# Patient Record
Sex: Female | Born: 1990
Health system: Southern US, Community
[De-identification: ages and names within clinical notes are randomized; demographics above are authoritative.]

## PROBLEM LIST (undated history)

## (undated) DIAGNOSIS — E079 Disorder of thyroid, unspecified: Secondary | ICD-10-CM

## (undated) DIAGNOSIS — D649 Anemia, unspecified: Secondary | ICD-10-CM

## (undated) DIAGNOSIS — K219 Gastro-esophageal reflux disease without esophagitis: Secondary | ICD-10-CM

## (undated) DIAGNOSIS — O09299 Supervision of pregnancy with other poor reproductive or obstetric history, unspecified trimester: Secondary | ICD-10-CM

## (undated) DIAGNOSIS — F419 Anxiety disorder, unspecified: Secondary | ICD-10-CM

## (undated) DIAGNOSIS — E039 Hypothyroidism, unspecified: Secondary | ICD-10-CM

## (undated) DIAGNOSIS — M519 Unspecified thoracic, thoracolumbar and lumbosacral intervertebral disc disorder: Secondary | ICD-10-CM

## (undated) HISTORY — DX: Unspecified thoracic, thoracolumbar and lumbosacral intervertebral disc disorder: M51.9

## (undated) HISTORY — DX: Supervision of pregnancy with other poor reproductive or obstetric history, unspecified trimester: O09.299

## (undated) HISTORY — DX: Gastro-esophageal reflux disease without esophagitis: K21.9

## (undated) HISTORY — PX: NO PAST SURGERIES: SHX2092

## (undated) HISTORY — DX: Disorder of thyroid, unspecified: E07.9

## (undated) HISTORY — DX: Anemia, unspecified: D64.9

## (undated) HISTORY — PX: ESOPHAGEAL DILATION: SHX303

---

## 2012-06-29 DIAGNOSIS — K589 Irritable bowel syndrome without diarrhea: Secondary | ICD-10-CM | POA: Insufficient documentation

## 2012-06-29 DIAGNOSIS — Z9889 Other specified postprocedural states: Secondary | ICD-10-CM | POA: Insufficient documentation

## 2013-10-24 DIAGNOSIS — N390 Urinary tract infection, site not specified: Secondary | ICD-10-CM | POA: Insufficient documentation

## 2014-01-11 DIAGNOSIS — H20812 Fuchs' heterochromic cyclitis, left eye: Secondary | ICD-10-CM | POA: Insufficient documentation

## 2014-04-24 DIAGNOSIS — G43109 Migraine with aura, not intractable, without status migrainosus: Secondary | ICD-10-CM | POA: Insufficient documentation

## 2015-07-19 ENCOUNTER — Encounter: Payer: Self-pay | Admitting: Family Medicine

## 2015-07-19 ENCOUNTER — Ambulatory Visit (INDEPENDENT_AMBULATORY_CARE_PROVIDER_SITE_OTHER): Payer: 59 | Admitting: Family Medicine

## 2015-07-19 VITALS — BP 103/59 | HR 67 | Temp 98.3°F | Ht 67.0 in | Wt 153.0 lb

## 2015-07-19 DIAGNOSIS — O9089 Other complications of the puerperium, not elsewhere classified: Secondary | ICD-10-CM | POA: Diagnosis not present

## 2015-07-19 DIAGNOSIS — E039 Hypothyroidism, unspecified: Secondary | ICD-10-CM | POA: Diagnosis not present

## 2015-07-19 DIAGNOSIS — M654 Radial styloid tenosynovitis [de Quervain]: Secondary | ICD-10-CM

## 2015-07-19 DIAGNOSIS — R102 Pelvic and perineal pain: Secondary | ICD-10-CM | POA: Diagnosis not present

## 2015-07-19 LAB — TSH: TSH: 1.85 u[IU]/mL (ref 0.350–4.500)

## 2015-07-19 MED ORDER — LEVOTHYROXINE SODIUM 25 MCG PO TABS: 25.0000 ug | ORAL_TABLET | Freq: Every day | ORAL | Status: DC

## 2015-07-19 NOTE — Patient Instructions (Signed)
De Quervain's Tenosynovitis De Quervain's tenosynovitis involves inflammation of one or two tendon linings (sheaths) or strain of one or two tendons to the thumb: extensor pollicis brevis (EPB), or abductor pollicis longus (APL). This causes pain on the side of the wrist and base of the thumb. Tendon sheaths secrete a fluid that lubricates the tendon, allowing the tendon to move smoothly. When the sheath becomes inflamed, the tendon cannot move freely in the sheath. Both the EPB and APL tendons are important for proper use of the hand. The EPB tendon is important for straightening the thumb. The APL tendon is important for moving the thumb away from the index finger (abducting). The two tendons pass through a small tube (canal) in the wrist, near the base of the thumb. When the tendons become inflamed, pain is usually felt in this area. SYMPTOMS   Pain, tenderness, swelling, warmth, or redness over the base of the thumb and thumb side of the wrist.  Pain that gets worse when straightening the thumb.  Pain that gets worse when moving the thumb away from the index finger, against resistance.  Pain with pinching or gripping.  Locking or catching of the thumb.  Limited motion of the thumb.  Crackling sound (crepitation) when the tendon or thumb is moved or touched.  Fluid-filled cyst in the area of the base of the thumb. CAUSES   Tenosynovitis is often linked with overuse of the wrist.  Tenosynovitis may be caused by repeated injury to the thumb muscle and tendon units, and with repeated motions of the hand and wrist, due to friction of the tendon within the lining (sheath).  Tenosynovitis may also be due to a sudden increase in activity or change in activity. RISK INCREASES WITH:  Sports that involve repeated hand and wrist motions (golf, bowling, tennis, squash, racquetball).  Heavy labor.  Poor physical wrist strength and flexibility.  Failure to warm up properly before practice or  play.  Female gender.  New mothers who hold their baby's head for long periods or lift infants with thumbs in the infant's armpit (axilla). PREVENTION  Warm up and stretch properly before practice or competition.  Allow enough time for rest and recovery between practices and competition.  Maintain appropriate conditioning:  Cardiovascular fitness.  Forearm, wrist, and hand flexibility.  Muscle strength and endurance.  Use proper exercise technique. PROGNOSIS  This condition is usually curable within 6 weeks, if treated properly with non-surgical treatment and resting of the affected area.  RELATED COMPLICATIONS   Longer healing time if not properly treated or if not given enough time to heal.  Chronic inflammation, causing recurring symptoms of tenosynovitis. Permanent pain or restriction of movement.  Risks of surgery: infection, bleeding, injury to nerves (numbness of the thumb), continued pain, incomplete release of the tendon sheath, recurring symptoms, cutting of the tendons, tendons sliding out of position, weakness of the thumb, thumb stiffness. TREATMENT  First, treatment involves the use of medicine and ice, to reduce pain and inflammation. Patients are encouraged to stop or modify activities that aggravate the injury. Stretching and strengthening exercises may be advised. Exercises may be completed at home or with a therapist. You may be fitted with a brace or splint, to limit motion and allow the injury to heal. Your caregiver may also choose to give you a corticosteroid injection, to reduce the pain and inflammation. If non-surgical treatment is not successful, surgery may be needed. Most tenosynovitis surgeries are done as outpatient procedures (you go home the   same day). Surgery may involve local, regional (whole arm), or general anesthesia.  MEDICATION   If pain medicine is needed, nonsteroidal anti-inflammatory medicines (aspirin and ibuprofen), or other minor pain  relievers (acetaminophen), are often advised.  Do not take pain medicine for 7 days before surgery.  Prescription pain relievers are often prescribed only after surgery. Use only as directed and only as much as you need.  Corticosteroid injections may be given if your caregiver thinks they are needed. There is a limited number of times these injections may be given. COLD THERAPY   Cold treatment (icing) should be applied for 10 to 15 minutes every 2 to 3 hours for inflammation and pain, and immediately after activity that aggravates your symptoms. Use ice packs or an ice massage. SEEK MEDICAL CARE IF:   Symptoms get worse or do not improve in 2 to 4 weeks, despite treatment.  You experience pain, numbness, or coldness in the hand.  Blue, gray, or dark color appears in the fingernails.  Any of the following occur after surgery: increased pain, swelling, redness, drainage of fluids, bleeding in the affected area, or signs of infection.  New, unexplained symptoms develop. (Drugs used in treatment may produce side effects.) Document Released: 10/27/2005 Document Revised: 01/19/2012 Document Reviewed: 02/08/2009 [employer] Patient Information 2015 [employer]. This information is not intended to replace advice given to you by your health care provider. Make sure you discuss any questions you have with your health care provider.   

## 2015-07-26 ENCOUNTER — Encounter: Payer: Self-pay | Admitting: Family Medicine

## 2015-07-26 ENCOUNTER — Ambulatory Visit (INDEPENDENT_AMBULATORY_CARE_PROVIDER_SITE_OTHER): Payer: 59 | Admitting: Family Medicine

## 2015-07-26 VITALS — BP 118/64 | HR 68 | Temp 97.8°F | Resp 16 | Ht 67.0 in | Wt 156.0 lb

## 2015-07-26 DIAGNOSIS — R300 Dysuria: Secondary | ICD-10-CM

## 2015-07-26 DIAGNOSIS — N390 Urinary tract infection, site not specified: Secondary | ICD-10-CM | POA: Diagnosis not present

## 2015-07-26 LAB — POCT URINALYSIS DIPSTICK
BILIRUBIN UA: NEGATIVE
Glucose, UA: NEGATIVE
KETONES UA: NEGATIVE
Nitrite, UA: POSITIVE
Protein, UA: NEGATIVE
Urobilinogen, UA: 0.2
pH, UA: 5

## 2015-07-26 LAB — POCT UA - MICROSCOPIC ONLY
CASTS, UR, LPF, POC: NEGATIVE
CRYSTALS, UR, HPF, POC: NEGATIVE
EPITHELIAL CELLS, URINE PER MICROSCOPY: NEGATIVE
MUCUS UA: NEGATIVE
YEAST UA: NEGATIVE

## 2015-07-26 MED ORDER — NITROFURANTOIN MACROCRYSTAL 25 MG PO CAPS: 25.0000 mg | ORAL_CAPSULE | Freq: Every day | ORAL | Status: DC

## 2015-07-26 MED ORDER — SULFAMETHOXAZOLE-TRIMETHOPRIM 800-160 MG PO TABS: 1.0000 | ORAL_TABLET | Freq: Two times a day (BID) | ORAL | Status: DC

## 2015-07-26 NOTE — Patient Instructions (Signed)
Results for orders placed or performed in visit on 07/19/15  TSH  Result Value Ref Range   TSH 1.850 0.350 - 4.500 uIU/mL       Urinary Tract Infection Urinary tract infections (UTIs) can develop anywhere along your urinary tract. Your urinary tract is your body's drainage system for removing wastes and extra water. Your urinary tract includes two kidneys, two ureters, a bladder, and a urethra. Your kidneys are a pair of bean-shaped organs. Each kidney is about the size of your fist. They are located below your ribs, one on each side of your spine. CAUSES Infections are caused by microbes, which are microscopic organisms, including fungi, viruses, and bacteria. These organisms are so small that they can only be seen through a microscope. Bacteria are the microbes that most commonly cause UTIs. SYMPTOMS  Symptoms of UTIs may vary by age and gender of the patient and by the location of the infection. Symptoms in young women typically include a frequent and intense urge to urinate and a painful, burning feeling in the bladder or urethra during urination. Older women and men are more likely to be tired, shaky, and weak and have muscle aches and abdominal pain. A fever may mean the infection is in your kidneys. Other symptoms of a kidney infection include pain in your back or sides below the ribs, nausea, and vomiting. DIAGNOSIS To diagnose a UTI, your caregiver will ask you about your symptoms. Your caregiver also will ask to provide a urine sample. The urine sample will be tested for bacteria and white blood cells. White blood cells are made by your body to help fight infection. TREATMENT  Typically, UTIs can be treated with medication. Because most UTIs are caused by a bacterial infection, they usually can be treated with the use of antibiotics. The choice of antibiotic and length of treatment depend on your symptoms and the type of bacteria causing your infection. HOME CARE INSTRUCTIONS  If you  were prescribed antibiotics, take them exactly as your caregiver instructs you. Finish the medication even if you feel better after you have only taken some of the medication.  Drink enough water and fluids to keep your urine clear or pale yellow.  Avoid caffeine, tea, and carbonated beverages. They tend to irritate your bladder.  Empty your bladder often. Avoid holding urine for long periods of time.  Empty your bladder before and after sexual intercourse.  After a bowel movement, women should cleanse from front to back. Use each tissue only once. SEEK MEDICAL CARE IF:   You have back pain.  You develop a fever.  Your symptoms do not begin to resolve within 3 days. SEEK IMMEDIATE MEDICAL CARE IF:   You have severe back pain or lower abdominal pain.  You develop chills.  You have nausea or vomiting.  You have continued burning or discomfort with urination. MAKE SURE YOU:   Understand these instructions.  Will watch your condition.  Will get help right away if you are not doing well or get worse. Document Released: 08/06/2005 Document Revised: 04/27/2012 Document Reviewed: 12/05/2011 [facility] Patient Information 2015 [place], [place]. This information is not intended to replace advice given to you by your health care provider. Make sure you discuss any questions you have with your health care provider.

## 2015-07-26 NOTE — Progress Notes (Signed)
This chart was scribed for Olivia Haber, MD by Olivia Shepard, medical scribe at [facility].The patient was seen in exam room 1 and the patient's care was started at 8:30 AM.  Patient ID: Olivia Shepard MRN: [id number], DOB: Sep 20, 1991, 24 y.o. Date of Encounter: 07/26/2015  Primary Physician: Beverlyn Roux, MD  Chief Complaint:  Chief Complaint  Patient presents with  . burning urination    x this morning  . Abdominal Pain    x this morning  . Urinary Frequency    x this morning    HPI:  Olivia Shepard is a 24 y.o. female who presents to [facility] complaining of UTI symptoms starting this morning.  She's had this before, and her last UTI was about a month ago. She usually gets them about once a month. She was on a preventative program, and stopped when she became pregnant. When she was pregnant, she didn't have any UTI. She took nitrofuran to relief. She's using a diaphragm and asked if this has effects towards UTI.   She has a 34mo child who's breast fed at home.  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 She used to work at [employer].   Past Medical History  Diagnosis Date  . Anemia   . GERD (gastroesophageal reflux disease)   . Thyroid disease      Home Meds: Prior to Admission medications   Medication Sig Start Date End Date Taking? Authorizing Provider  levothyroxine (LEVOTHROID) 25 MCG tablet Take 1 tablet (25 mcg total) by mouth daily before breakfast. 07/19/15   Frazier Richards, MD    Allergies: No Known Allergies  Social History   Social History  . Marital Status: Married    Spouse Name: N/A  . Number of Children: N/A  . Years of Education: N/A   Occupational History  . Not on file.   Social History Main Topics  . Smoking status: Never Smoker   . Smokeless tobacco: Not on file  . Alcohol Use: Not on file  . Drug Use: Not on file  . Sexual Activity: Not on file   Other Topics Concern  . Not on file   Social  History Narrative     Review of Systems: Constitutional: negative for chills, fever, night sweats, weight changes, or fatigue  HEENT: negative for vision changes, hearing loss, congestion, rhinorrhea, ST, epistaxis, or sinus pressure Cardiovascular: negative for chest pain or palpitations Respiratory: negative for hemoptysis, wheezing, shortness of breath, or cough Abdominal: negative for nausea, vomiting, diarrhea, or constipation; positive for abdominal pain Dermatological: negative for rash Neurologic: negative for headache, dizziness, or syncope GU: positive for dysuria, increased urinary frequency All other systems reviewed and are otherwise negative with the exception to those above and in the HPI.  Physical Exam: Shepard pressure 118/64, pulse 68, temperature 97.8 F (36.6 C), temperature source Oral, resp. rate 16, height 5\' 7"  (1.702 m), weight 156 lb (70.761 kg), last menstrual period 06/25/2015, SpO2 99 %., Body mass index is 24.43 kg/(m^2). General: Well developed, well nourished, in no acute distress. Head: Normocephalic, atraumatic, eyes without discharge, sclera non-icteric, nares are without discharge. Bilateral auditory canals clear, TM's are without perforation, pearly grey and translucent with reflective cone of light bilaterally. Oral cavity moist, posterior pharynx without exudate, erythema, peritonsillar abscess, or post nasal drip.  Neck: Supple. No thyromegaly. Full ROM. No lymphadenopathy. Lungs: Clear bilaterally to auscultation without wheezes, rales, or rhonchi. Breathing  is unlabored. Heart: RRR with S1 S2. No murmurs, rubs, or gallops appreciated. Abdomen: Soft, non-tender, non-distended with normoactive bowel sounds. No hepatomegaly. No rebound/guarding. No obvious abdominal masses. Msk:  Strength and tone normal for age. Extremities/Skin: Warm and dry. No clubbing or cyanosis. No edema. No rashes or suspicious lesions. Neuro: Alert and oriented X 3. Moves all  extremities spontaneously. Gait is normal. CNII-XII grossly in tact. Psych:  Responds to questions appropriately with a normal affect.   Labs: Results for orders placed or performed in visit on 07/26/15  POCT urinalysis dipstick  Result Value Ref Range   Color, UA Amber    Clarity, UA Cloudy    Glucose, UA Negative    Bilirubin, UA Negative    Ketones, UA Negative    Spec Grav, UA <=1.005    Shepard, UA Small    pH, UA 5.0    Protein, UA Negative    Urobilinogen, UA 0.2    Nitrite, UA Positive    Leukocytes, UA small (1+) (A) Negative  POCT UA - Microscopic Only  Result Value Ref Range   WBC, Ur, HPF, POC 1-3    RBC, urine, microscopic 0-3    Bacteria, U Microscopic Trace    Mucus, UA Negative    Epithelial cells, urine per micros Negative    Crystals, Ur, HPF, POC negative    Casts, Ur, LPF, POC Negative    Yeast, UA Negative      ASSESSMENT AND PLAN:  24 y.o. year old female with recurrent UTI, probably related to the diaphragm use since no UTI during pregnancy. This chart was scribed in my presence and reviewed by me personally.    ICD-9-CM ICD-10-CM   1. Burning with urination 788.1 R30.0 POCT urinalysis dipstick     POCT UA - Microscopic Only  2. Recurrent UTI 599.0 N39.0 sulfamethoxazole-trimethoprim (BACTRIM DS,SEPTRA DS) 800-160 MG per tablet     nitrofurantoin (MACRODANTIN) 25 MG capsule   Signed, Olivia Haber, MD 07/26/2015 8:30 AM

## 2015-07-27 ENCOUNTER — Encounter: Payer: Self-pay | Admitting: Family Medicine

## 2015-07-27 NOTE — Assessment & Plan Note (Signed)
Pain since pregnancy, positive finkelsteins, tender over extensor policis tendons - rest, nsaids, thumb spica

## 2015-07-27 NOTE — Assessment & Plan Note (Signed)
Pain especially with intercourse since delivery, did have 2 perineal lacerations per her report - recommend lubrication with any sexual activity - f/u if still causing problems in 2-3 months

## 2015-07-27 NOTE — Progress Notes (Signed)
   Subjective:   Camryn Lampson is a 24 y.o. female with a history of gestational hypothyroid here for establish care  Pt reports only history is low thyroid in pregnancy. 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 She is currently breastfeeding. Uses diaphragm for birth control, severe migraines from any hormonal contraceptives in past. Satisfied with current method. Last pap during pregnancy.  Review of Systems:  Per HPI. All other systems reviewed and are negative.   PMH, PSH, Medications, Allergies, and FmHx reviewed and updated in EMR.  Social History: never smoker  Objective:  BP 103/59 mmHg  Pulse 67  Temp(Src) 98.3 F (36.8 C) (Oral)  Ht 5\' 7"  (1.702 m)  Wt 153 lb (69.4 kg)  BMI 23.96 kg/m2  LMP 07/05/2015  Gen:  24 y.o. female in NAD HEENT: NCAT, MMM, EOMI, PERRL, anicteric sclerae CV: RRR, no MRG, no JVD Resp: Non-labored, CTAB, no wheezes noted Abd: Soft, NTND, BS present, no guarding or organomegaly Ext: WWP, no edema MSK: Full ROM, strength intact Neuro: Alert and oriented, speech normal      Chemistry   No results found for: NA, K, CL, CO2, BUN, CREATININE, GLU No results found for: CALCIUM, ALKPHOS, AST, ALT, BILITOT    No results found for: WBC, HGB, HCT, MCV, PLT Lab Results  Component Value Date   TSH 1.850 07/19/2015   No results found for: HGBA1C Assessment & Plan:     Timberlyn Pickford is a 24 y.o. female here for establish care  De Quervain's tenosynovitis, left Pain since pregnancy, positive finkelsteins, tender over extensor policis tendons - rest, nsaids, thumb spica  Hypothyroidism Started in pregnancy, controlled with low dose synthroid (53mcg), normal 8 weeks postpartum (now 4 month pp) - recheck tsh today - trial off synthroid, recheck tsh in 6 weeks  Postpartum perineal pain Pain especially with intercourse since delivery, did have 2 perineal lacerations per her report - recommend  lubrication with any sexual activity - f/u if still causing problems in 2-3 months    Beverlyn Roux, MD, MPH [facility] PGY-3 07/27/2015 9:01 PM

## 2015-07-27 NOTE — Assessment & Plan Note (Signed)
Started in pregnancy, controlled with low dose synthroid (9mcg), normal 8 weeks postpartum (now 4 month pp) - recheck tsh today - trial off synthroid, recheck tsh in 6 weeks

## 2015-08-30 ENCOUNTER — Encounter: Payer: Self-pay | Admitting: Family Medicine

## 2015-08-31 ENCOUNTER — Ambulatory Visit (INDEPENDENT_AMBULATORY_CARE_PROVIDER_SITE_OTHER): Payer: 59 | Admitting: Family Medicine

## 2015-08-31 ENCOUNTER — Encounter: Payer: Self-pay | Admitting: Family Medicine

## 2015-08-31 VITALS — BP 97/62 | HR 77 | Temp 98.5°F | Ht 67.0 in | Wt 148.8 lb

## 2015-08-31 DIAGNOSIS — E039 Hypothyroidism, unspecified: Secondary | ICD-10-CM | POA: Diagnosis not present

## 2015-08-31 LAB — T4, FREE: FREE T4: 0.98 ng/dL (ref 0.80–1.80)

## 2015-08-31 LAB — TSH: TSH: 2.92 u[IU]/mL (ref 0.350–4.500)

## 2015-08-31 LAB — T3, FREE: T3, Free: 3 pg/mL (ref 2.3–4.2)

## 2015-08-31 NOTE — Progress Notes (Signed)
Patient ID: Olivia Shepard, female   DOB: 08/07/1991, 24 y.o.   MRN: [id number]   Subjective: CC:  Fatigue, decline in milk output, heat intolerance HPI: This history was provided by the patient.  Patient's PMH is significant for thyroid disease, anemia and GERD.  Patient is a 24 y.o. female presenting to clinic today for several issues she believes to be related to hypothyroidism.  Patient was diagnosed with hypothyroidism during pregnancy earlier this year.  She stopped taking the Levothyroxine in September 2016.  Over the last two weeks, patient states she has developed heat intolerance with a sensation that she cannot take a deep breath in temperatures as low as 83 or 85 fahrenheit.  Patient denies cold intolerance.  Patient also states her breast milk production has declined from 5-6 ounces each pumping session to 1-2 ounces.  Patient also complains of fatigue.  She is a night shift nurse and states it used to take her a day to "recover" from working night shift, but now it takes 2-3 days.  Patient states she finds herself napping much more often than usual during the day over the past 2 weeks.  Concerns today include:  1. Fatigue  Social History Reviewed: non-smoker. FamHx and MedHx updated.  Please see EMR.  ROS: General: Complains of fatigue, denies fever/chills, unintentional weight loss/gain, and difficulty sleeping. Cardio: Denies palpitations, chest pain, chest tightness, dizziness, diaphoresis. Pulm: Denies overt shortness of breath, wheezing and cough. GI:  Reports occasional constipation that has been presents since the birth of her daughter 6 months ago.  Denies N/V/D, abdominal pain.   Skin: Denies brittle skin/nails, bruising, rashes, itching, dry skin. Neuro: Denies dizziness, changes in vision, changes in gait, weakness, numbness.  Objective: Office vital signs reviewed. BP 97/62 mmHg  Pulse 77  Temp(Src) 98.5 F (36.9 C) (Oral)  Ht 5\' 7"  (1.702 m)  Wt 148 lb 12.8 oz  (67.495 kg)  BMI 23.30 kg/m2  LMP 08/28/2015 (Exact Date)  Physical Examination:  General: Awake, alert, well-nourished, NAD Cardio: RRR, S1S2 heard, no murmurs appreciated, No edema, cyanosis or clubbing; +2 radial pulses bilaterally Pulm: CTAB, no wheezes, rhonchi or rales GI: soft, not distended,+BS x4, no hepatomegaly, no splenomegaly Skin: dry, intact, no rashes or lesions Neuro: Strength and sensation grossly intact, CNII-XII grossly intact.   Assessment/ Plan: 24 y.o. female presenting with complaints of fatigue, heat intolerance and difficulty producing breast milk after discontinuing Levothyroxine approximately one month ago.  Patient has a history of hypothyroid disease in pregnancy.  Patient's symptoms or fatigue, heat intolerance and decline in breast milk production are consistent with hormonal imbalance. Patient's physical exam unremarkable.  Further diagnostic lab testing will be needed to determine origin of patient's symptoms.   1. Hypothyroidism, unspecified hypothyroidism type - TSH - T3, Free - T4, Free - Cortisol     Sherron Ales, NP [place]

## 2015-08-31 NOTE — Patient Instructions (Signed)

## 2015-08-31 NOTE — Assessment & Plan Note (Addendum)
Stopped synthroid 22mcg 1 month ago, symptoms x2 weeks of heat and cold intolerance, fatigue, decreased milk production - recheck TSH, T3, T4 and screening serum cortisol (unlikely Sheehans but possible)

## 2015-09-01 LAB — CORTISOL: Cortisol, Plasma: 15.7 ug/dL

## 2015-09-03 ENCOUNTER — Encounter: Payer: Self-pay | Admitting: Family Medicine

## 2015-09-06 ENCOUNTER — Encounter: Payer: Self-pay | Admitting: Family Medicine

## 2015-09-25 ENCOUNTER — Encounter: Payer: Self-pay | Admitting: Family Medicine

## 2015-09-25 ENCOUNTER — Ambulatory Visit (INDEPENDENT_AMBULATORY_CARE_PROVIDER_SITE_OTHER): Payer: 59 | Admitting: Family Medicine

## 2015-09-25 VITALS — BP 111/59 | HR 71 | Temp 98.2°F | Ht 67.0 in | Wt 148.0 lb

## 2015-09-25 DIAGNOSIS — M654 Radial styloid tenosynovitis [de Quervain]: Secondary | ICD-10-CM | POA: Diagnosis not present

## 2015-09-25 DIAGNOSIS — E039 Hypothyroidism, unspecified: Secondary | ICD-10-CM

## 2015-09-25 NOTE — Assessment & Plan Note (Signed)
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

## 2015-09-25 NOTE — Assessment & Plan Note (Signed)
Restarted synthroid based on symptoms despite normal labs, doing much better. Breastmilk nadir was 1 ounce per pumping and is now back up to adequate for her 17mo. - continue synthroid 63mcg daily - will discuss attempting to stop again once finished breastfeeding

## 2015-09-25 NOTE — Progress Notes (Signed)
.     Subjective:   Olivia Shepard is a 24 y.o. female with a history of hypothyroidism, recent pregnancy and dequervains here for f/u fatigue and low breastmilk production  Labs normal off synthroid last time but severe symptoms limiting ability to breastfeed. Joint decision to restart synthroid 2 weeks ago. Patient reports that within two days of restarting her breastmilk increased and she started having more energy and by 1 week she felt back to her usual self.   She also reports that her wrist pain is unchanged. 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 She takes ibuprofen occasionally which helps, as does ice. She does not want to take meds much because of breastfeeding  Review of Systems:  Per HPI. All other systems reviewed and are negative.   PMH, PSH, Medications, Allergies, and FmHx reviewed and updated in EMR.  Social History: never smoker  Objective:  BP 111/59 mmHg  Pulse 71  Temp(Src) 98.2 F (36.8 C) (Oral)  Ht 5\' 7"  (1.702 m)  Wt 148 lb (67.132 kg)  BMI 23.17 kg/m2  LMP 09/25/2015  Gen:  24 y.o. female in NAD HEENT: NCAT, MMM, EOMI, PERRL, anicteric sclerae CV: RRR, no MRG, no JVD Resp: Non-labored, CTAB, no wheezes noted Abd: Soft, NTND, BS present, no guarding or organomegaly Ext: WWP, no edema MSK: Full ROM, strength intact Neuro: Alert and oriented, speech normal      Chemistry   No results found for: NA, K, CL, CO2, BUN, CREATININE, GLU No results found for: CALCIUM, ALKPHOS, AST, ALT, BILITOT    No results found for: WBC, HGB, HCT, MCV, PLT Lab Results  Component Value Date   TSH 2.920 08/31/2015   No results found for: HGBA1C Assessment & Plan:     Olivia Shepard is a 24 y.o. female here for f/u hypothyroid  Hypothyroidism Restarted synthroid based on symptoms despite normal labs, doing much better. Breastmilk nadir was 1 ounce per pumping and is now back  up to adequate for her 15mo. 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

## 2015-10-09 ENCOUNTER — Encounter: Payer: Self-pay | Admitting: Family Medicine

## 2015-10-09 ENCOUNTER — Ambulatory Visit (INDEPENDENT_AMBULATORY_CARE_PROVIDER_SITE_OTHER): Payer: 59 | Admitting: Family Medicine

## 2015-10-09 VITALS — BP 100/68 | Ht 67.0 in | Wt 148.0 lb

## 2015-10-09 DIAGNOSIS — M654 Radial styloid tenosynovitis [de Quervain]: Secondary | ICD-10-CM

## 2015-10-09 MED ORDER — TRIAMCINOLONE ACETONIDE 10 MG/ML IJ SUSP
10.0000 mg | Freq: Once | INTRAMUSCULAR | Status: AC
  Administered 2015-10-09: 10 mg via INTRAMUSCULAR

## 2015-10-09 MED ORDER — DICLOFENAC SODIUM 1 % TD GEL: 2.0000 g | Freq: Four times a day (QID) | TRANSDERMAL | Status: DC

## 2015-10-10 DIAGNOSIS — M654 Radial styloid tenosynovitis [de Quervain]: Secondary | ICD-10-CM | POA: Insufficient documentation

## 2015-10-10 NOTE — Assessment & Plan Note (Signed)
B/l, L>>R.  Does not desire to take NSAIDs due to breastfeeding. Unable to wear thumb spica splint due lifestyle and occupational needs.  Performed U/S guided injection which provided relief.  Offered voltaren for left rather than injection, but she declined, wanting an injection 1st.  She will try voltaren gel for the right and f/u PRN for a right sided injection.  She will f/u in 2 weeks. She will try to wear the splint as much as possible.  Call with any questions.

## 2015-10-10 NOTE — Progress Notes (Signed)
  Xoey Peglow - 24 y.o. female MRN [id number]  Date of birth: Dec 13, 1990  CC: Left wrist pain  SUBJECTIVE:   HPI  B/l wrist pain for the last few months. Left is much worse than the right.  - She works as an Occupational hygienist, currently 2 days a week.  - Additionally she has an ~14 month old and it causes her discomfort to pick her daughter up.  - Doesn't want to take NSAIDs due to breastfeeding.  - She was provided a brace, but has not been able to use it much because she needs to care for her baby and work.   - This has not been a problem in past.  - Began several months PP and has worsened over the last few weeks.  - NO trauma to either wrist.   ROS:     8 point RoS negative other than that listed above in regards to MSK issue.   HISTORY: Past Medical, Surgical, Social, and Family History Reviewed & Updated per EMR.  Pertinent Historical Findings include: hypothyroid, PP, not diabetic.   OBJECTIVE: BP 100/68 mmHg  Ht 5\' 7"  (1.702 m)  Wt 148 lb (67.132 kg)  BMI 23.17 kg/m2  LMP 09/25/2015  Physical Exam  Calm, NAD  non-labored breathing.   Full RoM of b/l upper extremities.   + Finkelstein's.  Tender over 1st extensor compartment, L>R, and extending anteriorly over the plamar aspect of the distal radial head.   Strength 5/5 b/l. No visible swelling.   Sensation is intact.   Ultrasound: Left wrist u/s.  Normal fibrillar pattern of the APL and EPB with surrounding anechoic fluid.  Radial artery identified and a 1st compartment injection was performed out of plane with a trans view of the tendons as below. Overall the u/s was consistent with Harriet Pho.   Consent obtained and verified. Sterile betadine prep. Furthur cleansed with alcohol. Topical analgesic spray: Ethyl chloride. Joint:1st dorsal compartment of the left wrist. Approached in typical fashion with: Out of plane with trans view of tendons using u/s.   Completed without difficulty Meds: 1 cc of 1% lidocaine and 1cc of  triamcinolone.  Needle:25 g x 1.5" Aftercare instructions and Red flags advised.  MEDICATIONS, LABS & OTHER ORDERS: Previous Medications   LEVOTHYROXINE (LEVOTHROID) 25 MCG TABLET    Take 1 tablet (25 mcg total) by mouth daily before breakfast.   NITROFURANTOIN (MACRODANTIN) 25 MG CAPSULE    Take 1 capsule (25 mg total) by mouth daily after breakfast.   Modified Medications   No medications on file   New Prescriptions   DICLOFENAC SODIUM (VOLTAREN) 1 % GEL    Apply 2 g topically 4 (four) times daily.   Discontinued Medications   No medications on file  No orders of the defined types were placed in this encounter.   ASSESSMENT & PLAN: See problem based charting & AVS for pt instructions.

## 2015-10-23 ENCOUNTER — Ambulatory Visit: Payer: 59 | Admitting: Family Medicine

## 2015-11-11 NOTE — L&D Delivery Note (Signed)
Delivery Note Pt was 9cm dilated with uncontrollable urge to push. Tried position change unto knees and leaning on bed to ease discomfort. Pt complained shortly after of need to push and proceeded to do so.  At 3:28 PM a viable female was delivered via Vaginal, Spontaneous Delivery (Presentation: OA ;  ).  APGAR: 9, 9; weight 8 lb 10.5 oz (3925 g).   Placenta status: intact, shultz, .    Anesthesia:  None; Episiotomy: None Lacerations: 2nd degree Suture Repair: 2.0 vicryl Est. Blood Loss (mL): 150  Mom to postpartum.  Baby to Couplet care / Skin to Skin.  Lorna Few Worema Ximenna Fonseca 09/16/2016, 5:57 PM

## 2015-12-05 MED FILL — LEVOTHYROXINE 25 MCG TABLET: 25 | 90 days supply | Qty: 90 | Fill #1 | Status: TO

## 2015-12-17 ENCOUNTER — Encounter: Payer: Self-pay | Admitting: Family Medicine

## 2015-12-27 ENCOUNTER — Ambulatory Visit (INDEPENDENT_AMBULATORY_CARE_PROVIDER_SITE_OTHER): Payer: 59 | Admitting: Family Medicine

## 2015-12-27 ENCOUNTER — Other Ambulatory Visit (HOSPITAL_COMMUNITY)
Admission: RE | Admit: 2015-12-27 | Discharge: 2015-12-27 | Disposition: A | Discharge status: Home / Self Care | Payer: 59 | Source: Ambulatory Visit | Attending: Family Medicine | Admitting: Family Medicine

## 2015-12-27 ENCOUNTER — Encounter: Payer: Self-pay | Admitting: Family Medicine

## 2015-12-27 VITALS — BP 113/51 | HR 81 | Temp 98.2°F | Ht 67.0 in | Wt 144.1 lb

## 2015-12-27 DIAGNOSIS — Z124 Encounter for screening for malignant neoplasm of cervix: Secondary | ICD-10-CM | POA: Diagnosis not present

## 2015-12-27 DIAGNOSIS — Z01419 Encounter for gynecological examination (general) (routine) without abnormal findings: Secondary | ICD-10-CM | POA: Diagnosis not present

## 2015-12-27 DIAGNOSIS — Z114 Encounter for screening for human immunodeficiency virus [HIV]: Secondary | ICD-10-CM | POA: Diagnosis not present

## 2015-12-27 DIAGNOSIS — Z0000 Encounter for general adult medical examination without abnormal findings: Secondary | ICD-10-CM

## 2015-12-27 LAB — BASIC METABOLIC PANEL WITH GFR
BUN: 12 mg/dL (ref 7–25)
CALCIUM: 9.3 mg/dL (ref 8.6–10.2)
CO2: 27 mmol/L (ref 20–31)
Chloride: 104 mmol/L (ref 98–110)
Creat: 0.55 mg/dL (ref 0.50–1.10)
GLUCOSE: 71 mg/dL (ref 65–99)
Potassium: 3.7 mmol/L (ref 3.5–5.3)
Sodium: 140 mmol/L (ref 135–146)

## 2015-12-27 LAB — LIPID PANEL
CHOL/HDL RATIO: 1.7 ratio (ref <=5.0)
CHOLESTEROL: 144 mg/dL (ref 125–200)
HDL: 83 mg/dL (ref >=46)
LDL CALC: 55 mg/dL (ref <130)
Triglycerides: 32 mg/dL (ref <150)
VLDL: 6 mg/dL (ref <30)

## 2015-12-27 NOTE — Progress Notes (Signed)
   Subjective:   Akirah Moros is a 25 y.o. female with a history of hypothyroidism here for annual wellness exam  Pt denies any health concerns or complaints. She exercise 1-2 times per week and eats a well balanced diet. She does not smoke or drink alcohol.  Review of Systems:  Per HPI. All other systems reviewed and are negative.   PMH, PSH, Medications, Allergies, and FmHx reviewed and updated in EMR.  Social History: never smoker  Objective:  BP 113/51 mmHg  Pulse 81  Temp(Src) 98.2 F (36.8 C) (Oral)  Ht 5\' 7"  (1.702 m)  Wt 144 lb 1.6 oz (65.363 kg)  BMI 22.56 kg/m2  Gen:  25 y.o. female in NAD HEENT: NCAT, MMM, EOMI, PERRL, anicteric sclerae CV: RRR, no MRG, no JVD Resp: Non-labored, CTAB, no wheezes noted Abd: Soft, NTND, BS present, no guarding or organomegaly Ext: WWP, no edema MSK: Full ROM, strength intact Neuro: Alert and oriented, speech normal Pelvic: Normal vaginal canal, cervix, normal adnexa and uterus on bimanual, no CMT      Chemistry   No results found for: NA, K, CL, CO2, BUN, CREATININE, GLU No results found for: CALCIUM, ALKPHOS, AST, ALT, BILITOT    No results found for: WBC, HGB, HCT, MCV, PLT Lab Results  Component Value Date   TSH 2.920 08/31/2015   No results found for: HGBA1C Assessment & Plan:     Jatavia Guglielmetti is a 25 y.o. female here for well woman exam  No concerns, healthy woman with active lifestyle. Will check HIV, pap smear. Pt also request screening bloodwork which is reasonable so will check BMP, lipids. H/op anemia but reports normal hemoglobin at [facility] a few days ago so will not recheck today. Also with h/o hypothyroidism which became symptomatic despite normal TSH. Is on low dose synthroid without symptoms and will plan to attempt to come off when no longer breastfeeding.  Beverlyn Roux, MD, MPH [facility] PGY-3 12/27/2015 4:24 PM

## 2015-12-27 NOTE — Patient Instructions (Signed)
Health Maintenance, Female Adopting a healthy lifestyle and getting preventive care can go a long way to promote health and wellness. Talk with your health care provider about what schedule of regular examinations is right for you. This is a good chance for you to check in with your provider about disease prevention and staying healthy. In between checkups, there are plenty of things you can do on your own. Experts have done a lot of research about which lifestyle changes and preventive measures are most likely to keep you healthy. Ask your health care provider for more information. WEIGHT AND DIET  Eat a healthy diet  Be sure to include plenty of vegetables, fruits, low-fat dairy products, and lean protein.  Do not eat a lot of foods high in solid fats, added sugars, or salt.  Get regular exercise. This is one of the most important things you can do for your health.  Most adults should exercise for at least 150 minutes each week. The exercise should increase your heart rate and make you sweat (moderate-intensity exercise).  Most adults should also do strengthening exercises at least twice a week. This is in addition to the moderate-intensity exercise.  Maintain a healthy weight  Body mass index (BMI) is a measurement that can be used to identify possible weight problems. It estimates body fat based on height and weight. Your health care provider can help determine your BMI and help you achieve or maintain a healthy weight.  For females 28 years of age and older:   A BMI below 18.5 is considered underweight.  A BMI of 18.5 to 24.9 is normal.  A BMI of 25 to 29.9 is considered overweight.  A BMI of 30 and above is considered obese.  Watch levels of cholesterol and blood lipids  You should start having your blood tested for lipids and cholesterol at 27 years of age, then have this test every 5 years.  You may need to have your cholesterol levels checked more often if:  Your lipid  or cholesterol levels are high.  You are older than 27 years of age.  You are at high risk for heart disease.  CANCER SCREENING   Lung Cancer  Lung cancer screening is recommended for adults 75-66 years old who are at high risk for lung cancer because of a history of smoking.  A yearly low-dose CT scan of the lungs is recommended for people who:  Currently smoke.  Have quit within the past 15 years.  Have at least a 30-pack-year history of smoking. A pack year is smoking an average of one pack of cigarettes a day for 1 year.  Yearly screening should continue until it has been 15 years since you quit.  Yearly screening should stop if you develop a health problem that would prevent you from having lung cancer treatment.  Breast Cancer  Practice breast self-awareness. This means understanding how your breasts normally appear and feel.  It also means doing regular breast self-exams. Let your health care provider know about any changes, no matter how small.  If you are in your 20s or 30s, you should have a clinical breast exam (CBE) by a health care provider every 1-3 years as part of a regular health exam.  If you are 25 or older, have a CBE every year. Also consider having a breast X-ray (mammogram) every year.  If you have a family history of breast cancer, talk to your health care provider about genetic screening.  If you  are at high risk for breast cancer, talk to your health care provider about having an MRI and a mammogram every year.  Breast cancer gene (BRCA) assessment is recommended for women who have family members with BRCA-related cancers. BRCA-related cancers include:  Breast.  Ovarian.  Tubal.  Peritoneal cancers.  Results of the assessment will determine the need for genetic counseling and BRCA1 and BRCA2 testing. Cervical Cancer Your health care provider may recommend that you be screened regularly for cancer of the pelvic organs (ovaries, uterus, and  vagina). This screening involves a pelvic examination, including checking for microscopic changes to the surface of your cervix (Pap test). You may be encouraged to have this screening done every 3 years, beginning at age 21.  For women ages 30-65, health care providers may recommend pelvic exams and Pap testing every 3 years, or they may recommend the Pap and pelvic exam, combined with testing for human papilloma virus (HPV), every 5 years. Some types of HPV increase your risk of cervical cancer. Testing for HPV may also be done on women of any age with unclear Pap test results.  Other health care providers may not recommend any screening for nonpregnant women who are considered low risk for pelvic cancer and who do not have symptoms. Ask your health care provider if a screening pelvic exam is right for you.  If you have had past treatment for cervical cancer or a condition that could lead to cancer, you need Pap tests and screening for cancer for at least 20 years after your treatment. If Pap tests have been discontinued, your risk factors (such as having a new sexual partner) need to be reassessed to determine if screening should resume. Some women have medical problems that increase the chance of getting cervical cancer. In these cases, your health care provider may recommend more frequent screening and Pap tests. Colorectal Cancer  This type of cancer can be detected and often prevented.  Routine colorectal cancer screening usually begins at 27 years of age and continues through 27 years of age.  Your health care provider may recommend screening at an earlier age if you have risk factors for colon cancer.  Your health care provider may also recommend using home test kits to check for hidden blood in the stool.  A small camera at the end of a tube can be used to examine your colon directly (sigmoidoscopy or colonoscopy). This is done to check for the earliest forms of colorectal  cancer.  Routine screening usually begins at age 50.  Direct examination of the colon should be repeated every 5-10 years through 27 years of age. However, you may need to be screened more often if early forms of precancerous polyps or small growths are found. Skin Cancer  Check your skin from head to toe regularly.  Tell your health care provider about any new moles or changes in moles, especially if there is a change in a mole's shape or color.  Also tell your health care provider if you have a mole that is larger than the size of a pencil eraser.  Always use sunscreen. Apply sunscreen liberally and repeatedly throughout the day.  Protect yourself by wearing long sleeves, pants, a wide-brimmed hat, and sunglasses whenever you are outside. HEART DISEASE, DIABETES, AND HIGH BLOOD PRESSURE   High blood pressure causes heart disease and increases the risk of stroke. High blood pressure is more likely to develop in:  People who have blood pressure in the high end   of the normal range (130-139/85-89 mm Hg).  People who are overweight or obese.  People who are African American.  If you are 38-23 years of age, have your blood pressure checked every 3-5 years. If you are 61 years of age or older, have your blood pressure checked every year. You should have your blood pressure measured twice--once when you are at a hospital or clinic, and once when you are not at a hospital or clinic. Record the average of the two measurements. To check your blood pressure when you are not at a hospital or clinic, you can use:  An automated blood pressure machine at a pharmacy.  A home blood pressure monitor.  If you are between 45 years and 39 years old, ask your health care provider if you should take aspirin to prevent strokes.  Have regular diabetes screenings. This involves taking a blood sample to check your fasting blood sugar level.  If you are at a normal weight and have a low risk for diabetes,  have this test once every three years after 27 years of age.  If you are overweight and have a high risk for diabetes, consider being tested at a younger age or more often. PREVENTING INFECTION  Hepatitis B  If you have a higher risk for hepatitis B, you should be screened for this virus. You are considered at high risk for hepatitis B if:  You were born in a country where hepatitis B is common. Ask your health care provider which countries are considered high risk.  Your parents were born in a high-risk country, and you have not been immunized against hepatitis B (hepatitis B vaccine).  You have HIV or AIDS.  You use needles to inject street drugs.  You live with someone who has hepatitis B.  You have had sex with someone who has hepatitis B.  You get hemodialysis treatment.  You take certain medicines for conditions, including cancer, organ transplantation, and autoimmune conditions. Hepatitis C  Blood testing is recommended for:  Everyone born from [birth year] through 1967.  Anyone with known risk factors for hepatitis C. Sexually transmitted infections (STIs)  You should be screened for sexually transmitted infections (STIs) including gonorrhea and chlamydia if:  You are sexually active and are younger than 27 years of age.  You are older than 27 years of age and your health care provider tells you that you are at risk for this type of infection.  Your sexual activity has changed since you were last screened and you are at an increased risk for chlamydia or gonorrhea. Ask your health care provider if you are at risk.  If you do not have HIV, but are at risk, it may be recommended that you take a prescription medicine daily to prevent HIV infection. This is called pre-exposure prophylaxis (PrEP). You are considered at risk if:  You are sexually active and do not regularly use condoms or know the HIV status of your partner(s).  You take drugs by injection.  You are sexually  active with a partner who has HIV. Talk with your health care provider about whether you are at high risk of being infected with HIV. If you choose to begin PrEP, you should first be tested for HIV. You should then be tested every 3 months for as long as you are taking PrEP.  PREGNANCY   If you are premenopausal and you may become pregnant, ask your health care provider about preconception counseling.  If you may  become pregnant, take 400 to 800 micrograms (mcg) of folic acid every day.  If you want to prevent pregnancy, talk to your health care provider about birth control (contraception). OSTEOPOROSIS AND MENOPAUSE   Osteoporosis is a disease in which the bones lose minerals and strength with aging. This can result in serious bone fractures. Your risk for osteoporosis can be identified using a bone density scan.  If you are 61 years of age or older, or if you are at risk for osteoporosis and fractures, ask your health care provider if you should be screened.  Ask your health care provider whether you should take a calcium or vitamin D supplement to lower your risk for osteoporosis.  Menopause may have certain physical symptoms and risks.  Hormone replacement therapy may reduce some of these symptoms and risks. Talk to your health care provider about whether hormone replacement therapy is right for you.  HOME CARE INSTRUCTIONS   Schedule regular health, dental, and eye exams.  Stay current with your immunizations.   Do not use any tobacco products including cigarettes, chewing tobacco, or electronic cigarettes.  If you are pregnant, do not drink alcohol.  If you are breastfeeding, limit how much and how often you drink alcohol.  Limit alcohol intake to no more than 1 drink per day for nonpregnant women. One drink equals 12 ounces of beer, 5 ounces of wine, or 1 ounces of hard liquor.  Do not use street drugs.  Do not share needles.  Ask your health care provider for help if  you need support or information about quitting drugs.  Tell your health care provider if you often feel depressed.  Tell your health care provider if you have ever been abused or do not feel safe at home.   This information is not intended to replace advice given to you by your health care provider. Make sure you discuss any questions you have with your health care provider.   Document Released: 05/12/2011 Document Revised: 11/17/2014 Document Reviewed: 09/28/2013 Elsevier Interactive Patient Education [employer].

## 2015-12-28 LAB — HIV ANTIBODY (ROUTINE TESTING W REFLEX): HIV 1&2 Ab, 4th Generation: NONREACTIVE

## 2015-12-28 LAB — CYTOLOGY - PAP

## 2016-01-24 DIAGNOSIS — Z3201 Encounter for pregnancy test, result positive: Secondary | ICD-10-CM | POA: Diagnosis not present

## 2016-01-24 DIAGNOSIS — N911 Secondary amenorrhea: Secondary | ICD-10-CM | POA: Diagnosis not present

## 2016-02-14 DIAGNOSIS — Z3A09 9 weeks gestation of pregnancy: Secondary | ICD-10-CM | POA: Diagnosis not present

## 2016-02-14 DIAGNOSIS — Z36 Encounter for antenatal screening of mother: Secondary | ICD-10-CM | POA: Diagnosis not present

## 2016-02-14 DIAGNOSIS — O26891 Other specified pregnancy related conditions, first trimester: Secondary | ICD-10-CM | POA: Diagnosis not present

## 2016-02-14 DIAGNOSIS — O3111X1 Continuing pregnancy after spontaneous abortion of one fetus or more, first trimester, fetus 1: Secondary | ICD-10-CM | POA: Diagnosis not present

## 2016-02-14 DIAGNOSIS — Z3481 Encounter for supervision of other normal pregnancy, first trimester: Secondary | ICD-10-CM | POA: Diagnosis not present

## 2016-02-14 LAB — OB RESULTS CONSOLE ABO/RH: RH TYPE: POSITIVE

## 2016-02-27 MED FILL — LEVOTHYROXINE 25 MCG TABLET: 25 | 90 days supply | Qty: 90 | Fill #0

## 2016-03-13 DIAGNOSIS — Z3A12 12 weeks gestation of pregnancy: Secondary | ICD-10-CM | POA: Diagnosis not present

## 2016-03-13 DIAGNOSIS — Z3481 Encounter for supervision of other normal pregnancy, first trimester: Secondary | ICD-10-CM | POA: Diagnosis not present

## 2016-04-22 DIAGNOSIS — Z36 Encounter for antenatal screening of mother: Secondary | ICD-10-CM | POA: Diagnosis not present

## 2016-04-22 DIAGNOSIS — O99282 Endocrine, nutritional and metabolic diseases complicating pregnancy, second trimester: Secondary | ICD-10-CM | POA: Diagnosis not present

## 2016-04-22 DIAGNOSIS — O3111X0 Continuing pregnancy after spontaneous abortion of one fetus or more, first trimester, not applicable or unspecified: Secondary | ICD-10-CM | POA: Diagnosis not present

## 2016-04-22 DIAGNOSIS — Z3A19 19 weeks gestation of pregnancy: Secondary | ICD-10-CM | POA: Diagnosis not present

## 2016-06-09 MED FILL — LEVOTHYROXINE 25 MCG TABLET: 25 | 30 days supply | Qty: 30 | Fill #1

## 2016-06-23 LAB — OB RESULTS CONSOLE HIV ANTIBODY (ROUTINE TESTING): HIV: NONREACTIVE

## 2016-06-24 ENCOUNTER — Ambulatory Visit: Payer: Medicaid Other | Attending: Obstetrics and Gynecology | Admitting: Physical Therapy

## 2016-06-24 DIAGNOSIS — M545 Low back pain, unspecified: Secondary | ICD-10-CM

## 2016-06-24 NOTE — Therapy (Signed)
[facility] [address] [place], [place], [address] Phone: [phone]   Fax:  [phone]  Physical Therapy Evaluation/1 time visit  Patient Details  Name: Olivia Shepard MRN: [id number] Date of Birth: [birth year] Referring Provider: Dr. Rica Shepard  Encounter Date: 06/24/2016      PT End of Session - 06/24/16 1615    Visit Number 1   PT Start Time [id number]   PT Stop Time 1613   PT Time Calculation (min) 40 min   Activity Tolerance Patient tolerated treatment well      Past Medical History:  Diagnosis Date  . Anemia   . GERD (gastroesophageal reflux disease)   . Thyroid disease     No past surgical history on file.  There were no vitals filed for this visit.       Subjective Assessment - 06/24/16 1537    Subjective When pregnant with 1st child left hip pain started;  with current 2nd pregnacy ( 27 weeks) pain has returned to left hip;  worse with walking in left SI jt region;  stiff in AM and when get off the floor.  Has a maternity belt .   Limitations Walking;House hold activities;Sitting   Diagnostic tests none   Patient Stated Goals ex's to manage pain   Currently in Pain? Yes   Pain Score 3    Pain Location Sacrum   Pain Orientation Left   Pain Type Acute pain   Pain Onset More than a month ago   Pain Frequency Intermittent   Aggravating Factors  walking; bridges; lunges   Pain Relieving Factors prenatal yoga            [facility] PT Assessment - 06/24/16 0001      Assessment   Medical Diagnosis back pain complicating pregnancy   Referring Provider Dr. Rica Shepard   Onset Date/Surgical Date --  1 month   Next MD Visit 1 month   Prior Therapy upper right back spasms     Precautions   Precautions Other (comment)   Precaution Comments Pregnant     Restrictions   Weight Bearing Restrictions No     Balance Screen   Has the patient fallen in the past 6 months No   Has the patient had a decrease  in activity level because of a fear of falling?  No   Is the patient reluctant to leave their home because of a fear of falling?  No     Home Environment   Living Environment Private residence   Type of Home House     Prior Function   Level of Independence Independent   Vocation Part time employment   Vocation Requirements nursing labor and delivery   Leisure playin with toddler     Observation/Other Assessments   Focus on Therapeutic Outcomes (FOTO)  not done secondary to [facility]     Posture/Postural Control   Posture/Postural Control No significant limitations     AROM   AROM Assessment Site --  lumbar and hip WNLs     Strength   Strength Assessment Site Hip  decreased transverse abdominal activation   Right/Left Hip Right;Left   Right Hip ABduction 5/5   Left Hip ABduction 4/5     Palpation   Palpation comment tender left SI joint line     Pelvic Compression   Findings Positive   Side Left     Sacral Compression   Findings Positive   Side  Left  PT Education - 06/24/16 1614    Education provided Yes   Education Details clams, ball squeeze, transverse abdominal activation   Person(s) Educated Patient   Methods Explanation;Demonstration;Handout   Comprehension Verbalized understanding;Returned demonstration             PT Long Term Goals - 06/24/16 2038      PT LONG TERM GOAL #1   Title The patient will be instructed in basic HEP for lumbo sacral stabilization     Time 1   Period Days   Status Achieved               Plan - 06/24/16 2030    Clinical Impression Statement The patient is a 25 year old with complaints of left sacroiliac region of pain and clicking during her current pregnancy.  The patient is worsened with walking.  Positive SI provacation tests.  Normal ROM.  Decreased transverse abdominal activation consistent with pregnancy.  Due to [facility] policy, she does not have a qualifying  diagnosis for physical therapy.  She was instructed in lumbo sacral stabilization ex's, use of SI belt as needed and basic self management.     PT Frequency One time visit      Patient will benefit from skilled therapeutic intervention in order to improve the following deficits and impairments:     Visit Diagnosis: Left-sided low back pain without sciatica     Problem List Patient Active Problem List   Diagnosis Date Noted  . Hypothyroidism 07/19/2015   Olivia Shepard, PT 06/24/16 8:43 PM Phone: [phone] Fax: [phone]  Olivia Shepard 06/24/2016, 8:43 PM  [facility] [address] [place], [place], [address] Phone: [phone]   Fax:  [phone]  Name: Olivia Shepard MRN: [id number] Date of Birth: Feb 19, 1991

## 2016-06-24 NOTE — Patient Instructions (Signed)
Abduction: Clam (Eccentric) - Side-Lying   Lie on side with knees bent. Lift top knee, keeping feet together. Keep trunk steady. Slowly lower for 3-5 seconds. _10-15__ reps per set, _1__ sets per day, _7__ days per week.  Don't  Add manual resistance or green band  Copyright  VHI. All rights reserved.   Lying with your back propped up:  Ball squeeze 10x;  Resisted isometric hip flexion 5x right/left;  Pull apart knees against band (small range)    Standing abdominal brace or on hands and knees.     [name] PT [facility] [address], [place], [place] [address] Phone # [phone] Fax [phone]

## 2016-07-10 ENCOUNTER — Encounter: Payer: Self-pay | Admitting: Internal Medicine

## 2016-07-10 DIAGNOSIS — Z3490 Encounter for supervision of normal pregnancy, unspecified, unspecified trimester: Secondary | ICD-10-CM | POA: Insufficient documentation

## 2016-07-10 DIAGNOSIS — Z3493 Encounter for supervision of normal pregnancy, unspecified, third trimester: Secondary | ICD-10-CM

## 2016-07-10 NOTE — Assessment & Plan Note (Signed)
-   Received documents from [facility] detailing patient's prenatal care - Hypothyroidism is being monitored

## 2016-07-11 MED FILL — LEVOTHYROXINE 25 MCG TABLET: 25 | 30 days supply | Qty: 30 | Fill #2

## 2016-07-24 NOTE — Progress Notes (Signed)
[facility] Progress Note  Subjective:  Olivia Shepard is a 24-y/o female who is [redacted] weeks pregnant, followed by outside OB, who presents for reflux and ongoing peeling of skin on bottom of L foot.  Reflux: - Worse with pregnancy. Has to sleep on 3 pillows.  - Gaviscon helps. Recently restarted nexium 20 mg and would like refill. Prilosec and tablets like TUMs have not helped. - Avoids acidic foods; basically just eats chicken, vegetables and soups - Does not lie down after eating and eats small portions - Per patient, had an esophageal dilation as complication from from scarring 2/2 esophagitis during college (2012 or 2013) - She says she was seen by a gastroenterologist in [place] a couple of years ago and told she would benefit from a Nissen fundoplication given severity of esophagitis; did not pursue at that time because family was about to move to [place] and because idea of surgery was somewhat frightening. However, patient's father struggled with GERD and now has Barrett's esophagus, so she is interested in re-assessment given concern for increased cancer risk. - Has had adequate weight gain during pregnancy despite these symptoms ROS: No emesis, no dark stools  Left foot peeling: - Says has been present since 2015. Has cleared up before with steroid cream and antifungal cream. Was told she had a fungus. Has also required treatment with PO anti-fungal and steroids; says flared up after she had her last child.   - Is very itchy but peeling is not as bad as it has been in the past.  - Is using athlete foot spray with little improvement.  No Known Allergies  Objective: Blood pressure (!) 106/51, pulse 79, temperature 98.2 F (36.8 C), temperature source Oral, weight 162 lb (73.5 kg). Constitutional: Well-appearing, gravid female in NAD Skin: Mild peeling of plantar surface L foot across lateral ball of foot and between last 2 toes.   Psychiatric: Normal mood and affect.   Vitals reviewed  Assessment/Plan: GERD (gastroesophageal reflux disease) - Patient with history of severe esophagitis, per her report. - Refilled nexium 20 mg daily. Continue gaviscon.  - Placed referral to see gastroenterologist about further work-up. Understands this would not be aggressively pursued while pregnant but wanting information on long-term strategy. Suggested placing referral to general surgeon instead to get more information about Nissen fundoplication but patient preferred to see GI specialist.   Tinea pedis - Suspect rash is fungal in nature given interdigital peeling - Recommended continuing topical treatments while pregnant; prescribed nystatin and kenalog creams to try in addition to patient's athlete's foot spray, as there will be minimal systemic absorption for application to small area of foot involved - Could also try putting antifungal powder in her footwear  Follow-up as needed.   Olene Floss, MD [place], PGY-2

## 2016-07-25 ENCOUNTER — Ambulatory Visit (INDEPENDENT_AMBULATORY_CARE_PROVIDER_SITE_OTHER): Payer: Medicaid Other | Admitting: Internal Medicine

## 2016-07-25 ENCOUNTER — Encounter: Payer: Self-pay | Admitting: Internal Medicine

## 2016-07-25 VITALS — BP 106/51 | HR 79 | Temp 98.2°F | Wt 162.0 lb

## 2016-07-25 DIAGNOSIS — R21 Rash and other nonspecific skin eruption: Secondary | ICD-10-CM | POA: Diagnosis not present

## 2016-07-25 DIAGNOSIS — B353 Tinea pedis: Secondary | ICD-10-CM | POA: Diagnosis not present

## 2016-07-25 DIAGNOSIS — E039 Hypothyroidism, unspecified: Secondary | ICD-10-CM | POA: Diagnosis not present

## 2016-07-25 DIAGNOSIS — K219 Gastro-esophageal reflux disease without esophagitis: Secondary | ICD-10-CM | POA: Diagnosis not present

## 2016-07-25 MED ORDER — TRIAMCINOLONE ACETONIDE 0.1 % EX CREA: TOPICAL_CREAM | CUTANEOUS | 0 refills | Status: DC

## 2016-07-25 MED ORDER — LEVOTHYROXINE SODIUM 25 MCG PO TABS: 25.0000 ug | ORAL_TABLET | Freq: Every day | ORAL | 11 refills | Status: DC

## 2016-07-25 MED ORDER — ESOMEPRAZOLE MAGNESIUM 20 MG PO CPDR: 20.0000 mg | DELAYED_RELEASE_CAPSULE | Freq: Every day | ORAL | 2 refills | Status: DC

## 2016-07-25 MED ORDER — NYSTATIN 100000 UNIT/GM EX CREA: 1.0000 "application " | TOPICAL_CREAM | Freq: Two times a day (BID) | CUTANEOUS | 0 refills | Status: DC

## 2016-07-25 NOTE — Assessment & Plan Note (Signed)
-   Suspect rash is fungal in nature given interdigital peeling - Recommended continuing topical treatments while pregnant; prescribed nystatin and kenalog creams to try in addition to patient's athlete's foot spray, as there will be minimal systemic absorption for application to small area of foot involved - Could also try putting antifungal powder in her footwear

## 2016-07-25 NOTE — Assessment & Plan Note (Signed)
-   Patient with history of severe esophagitis, per her report. - Refilled nexium 20 mg daily. Continue gaviscon.  - Placed referral to see gastroenterologist about further work-up. Understands this would not be aggressively pursued while pregnant but wanting information on long-term strategy. Suggested placing referral to general surgeon instead to get more information about Nissen fundoplication but patient preferred to see GI specialist.

## 2016-07-25 NOTE — Patient Instructions (Signed)
Ms. Olivia, Shepard will get a call about an appointment with GI next week. If you don't hear anything, please call clinic for me to follow-up on this for you.  I have prescribed nexium. If the 20 mg is not strong enough, you can increase to 40 mg--just let me know.  I have prescribed nystatin cream for you to try on your foot. (You can also use this for [name]'s diaper rash.)  Best, Dr. Ola Spurr   Athlete's Foot  Athlete's foot is a skin infection caused by a fungus. Athlete's foot is often seen between or under the toes. It can also be seen on the bottom of the foot. Athlete's foot can spread to other people by sharing towels or shower stalls. HOME CARE  Only take medicines as told by your doctor. Do not use steroid creams.  Wash your feet daily. Dry your feet well, especially between the toes.  Change your socks every day. Wear cotton or wool socks.  Change your socks 2 to 3 times a day in hot weather.  Wear sandals or canvas tennis shoes with good airflow.  If you have blisters, soak your feet in a solution as told by your doctor. Do this for 20 to 30 minutes, 2 times a day. Dry your feet well after you soak them.  Do not share towels.  Wear sandals when you use shared locker rooms or showers. GET HELP RIGHT AWAY IF:   You have a fever.  Your foot is puffy (swollen), sore, warm, or red.  You are not getting better after 7 days of treatment.  You still have athlete's foot after 30 days.  You have problems caused by your medicine. MAKE SURE YOU:   Understand these instructions.  Will watch your condition.  Will get help right away if you are not doing well or get worse.   This information is not intended to replace advice given to you by your health care provider. Make sure you discuss any questions you have with your health care provider.   Document Released: 04/14/2008 Document Revised: 01/19/2012 Document Reviewed: 04/30/2015 Elsevier Interactive Patient  Education [employer].

## 2016-07-30 ENCOUNTER — Ambulatory Visit: Payer: 59 | Admitting: Gastroenterology

## 2016-08-13 ENCOUNTER — Encounter: Payer: Self-pay | Admitting: Internal Medicine

## 2016-08-14 ENCOUNTER — Other Ambulatory Visit: Payer: Self-pay | Admitting: Internal Medicine

## 2016-08-14 DIAGNOSIS — K219 Gastro-esophageal reflux disease without esophagitis: Secondary | ICD-10-CM

## 2016-08-14 MED ORDER — ESOMEPRAZOLE MAGNESIUM 40 MG PO CPDR: 40.0000 mg | DELAYED_RELEASE_CAPSULE | Freq: Every day | ORAL | 4 refills | Status: DC

## 2016-08-14 NOTE — Telephone Encounter (Signed)
Spoke to [facility] about medication. They had wrong fax number for sending [facility] the prior auth form. Provided accurate pharmacy number. Will ask for clarification about GI referral.

## 2016-08-15 LAB — OB RESULTS CONSOLE GBS: GBS: NEGATIVE

## 2016-08-15 NOTE — Telephone Encounter (Signed)
Prior Authorization received from [facility] for Esomeprazole 40 mg. Called [employer]; patient has [employer].  She will need to contact her case worker regarding this issue.  Derl Barrow, RN

## 2016-09-16 ENCOUNTER — Inpatient Hospital Stay (HOSPITAL_COMMUNITY)
Admission: AD | Admit: 2016-09-16 | Discharge: 2016-09-18 | DRG: 775 | Disposition: A | Discharge status: Home / Self Care | Payer: Medicaid Other | Source: Ambulatory Visit | Attending: Obstetrics and Gynecology | Admitting: Obstetrics and Gynecology

## 2016-09-16 ENCOUNTER — Encounter (HOSPITAL_COMMUNITY): Payer: Self-pay | Admitting: *Deleted

## 2016-09-16 DIAGNOSIS — Z833 Family history of diabetes mellitus: Secondary | ICD-10-CM

## 2016-09-16 DIAGNOSIS — K219 Gastro-esophageal reflux disease without esophagitis: Secondary | ICD-10-CM | POA: Diagnosis present

## 2016-09-16 DIAGNOSIS — O9962 Diseases of the digestive system complicating childbirth: Secondary | ICD-10-CM | POA: Diagnosis present

## 2016-09-16 DIAGNOSIS — Z3490 Encounter for supervision of normal pregnancy, unspecified, unspecified trimester: Secondary | ICD-10-CM

## 2016-09-16 DIAGNOSIS — Z3403 Encounter for supervision of normal first pregnancy, third trimester: Secondary | ICD-10-CM | POA: Diagnosis present

## 2016-09-16 DIAGNOSIS — Z3A39 39 weeks gestation of pregnancy: Secondary | ICD-10-CM | POA: Diagnosis not present

## 2016-09-16 LAB — ABO/RH: ABO/RH(D): B POS

## 2016-09-16 LAB — TYPE AND SCREEN
ABO/RH(D): B POS
ANTIBODY SCREEN: NEGATIVE

## 2016-09-16 LAB — CBC
HEMATOCRIT: 35.3 % — AB (ref 36.0–46.0)
Hemoglobin: 12.1 g/dL (ref 12.0–15.0)
MCH: 30.4 pg (ref 26.0–34.0)
MCHC: 34.3 g/dL (ref 30.0–36.0)
MCV: 88.7 fL (ref 78.0–100.0)
Platelets: 140 10*3/uL — ABNORMAL LOW (ref 150–400)
RBC: 3.98 MIL/uL (ref 3.87–5.11)
RDW: 14.4 % (ref 11.5–15.5)
WBC: 6.8 10*3/uL (ref 4.0–10.5)

## 2016-09-16 MED ORDER — SIMETHICONE 80 MG PO CHEW
80.0000 mg | CHEWABLE_TABLET | ORAL | Status: DC | PRN
Start: 2016-09-16 — End: 2016-09-18

## 2016-09-16 MED ORDER — ONDANSETRON HCL 4 MG PO TABS: 4.0000 mg | ORAL_TABLET | ORAL | Status: DC | PRN

## 2016-09-16 MED ORDER — KETOROLAC TROMETHAMINE 30 MG/ML IJ SOLN
30.0000 mg | Freq: Once | INTRAMUSCULAR | Status: AC
  Administered 2016-09-16: 30 mg via INTRAVENOUS
  Filled 2016-09-16: qty 1

## 2016-09-16 MED ORDER — OXYCODONE HCL 5 MG PO TABS
10.0000 mg | ORAL_TABLET | ORAL | Status: DC | PRN
  Administered 2016-09-17 (×3): 10 mg via ORAL
  Filled 2016-09-16 (×3): qty 2

## 2016-09-16 MED ORDER — ZOLPIDEM TARTRATE 5 MG PO TABS: 5.0000 mg | ORAL_TABLET | Freq: Every evening | ORAL | Status: DC | PRN

## 2016-09-16 MED ORDER — ACETAMINOPHEN 325 MG PO TABS
650.0000 mg | ORAL_TABLET | ORAL | Status: DC | PRN
  Administered 2016-09-16 – 2016-09-18 (×4): 650 mg via ORAL
  Filled 2016-09-16 (×4): qty 2

## 2016-09-16 MED ORDER — OXYCODONE HCL 5 MG PO TABS
5.0000 mg | ORAL_TABLET | ORAL | Status: DC | PRN
  Administered 2016-09-17 – 2016-09-18 (×2): 5 mg via ORAL
  Filled 2016-09-16 (×2): qty 1

## 2016-09-16 MED ORDER — COCONUT OIL OIL: 1.0000 "application " | TOPICAL_OIL | Status: DC | PRN

## 2016-09-16 MED ORDER — ONDANSETRON HCL 4 MG/2ML IJ SOLN: 4.0000 mg | INTRAMUSCULAR | Status: DC | PRN

## 2016-09-16 MED ORDER — SOD CITRATE-CITRIC ACID 500-334 MG/5ML PO SOLN: 30.0000 mL | ORAL | Status: DC | PRN

## 2016-09-16 MED ORDER — TETANUS-DIPHTH-ACELL PERTUSSIS 5-2.5-18.5 LF-MCG/0.5 IM SUSP: 0.5000 mL | Freq: Once | INTRAMUSCULAR | Status: DC

## 2016-09-16 MED ORDER — IBUPROFEN 600 MG PO TABS
600.0000 mg | ORAL_TABLET | Freq: Four times a day (QID) | ORAL | Status: DC
  Administered 2016-09-17 – 2016-09-18 (×6): 600 mg via ORAL
  Filled 2016-09-16 (×6): qty 1

## 2016-09-16 MED ORDER — LACTATED RINGERS IV SOLN
INTRAVENOUS | Status: DC
  Administered 2016-09-16: 13:00:00 via INTRAVENOUS

## 2016-09-16 MED ORDER — SENNOSIDES-DOCUSATE SODIUM 8.6-50 MG PO TABS
2.0000 | ORAL_TABLET | ORAL | Status: DC
  Administered 2016-09-16 – 2016-09-17 (×2): 2 via ORAL
  Filled 2016-09-16 (×2): qty 2

## 2016-09-16 MED ORDER — LIDOCAINE HCL (PF) 1 % IJ SOLN
30.0000 mL | INTRAMUSCULAR | Status: DC | PRN
  Administered 2016-09-16: 30 mL via SUBCUTANEOUS
  Filled 2016-09-16: qty 30

## 2016-09-16 MED ORDER — OXYTOCIN BOLUS FROM INFUSION: 500.0000 mL | Freq: Once | INTRAVENOUS | Status: DC

## 2016-09-16 MED ORDER — LACTATED RINGERS IV SOLN: 500.0000 mL | INTRAVENOUS | Status: DC | PRN

## 2016-09-16 MED ORDER — WITCH HAZEL-GLYCERIN EX PADS: 1.0000 "application " | MEDICATED_PAD | CUTANEOUS | Status: DC | PRN

## 2016-09-16 MED ORDER — OXYTOCIN 40 UNITS IN LACTATED RINGERS INFUSION - SIMPLE MED
2.5000 [IU]/h | INTRAVENOUS | Status: DC
  Filled 2016-09-16: qty 1000

## 2016-09-16 MED ORDER — DIBUCAINE 1 % RE OINT: 1.0000 "application " | TOPICAL_OINTMENT | RECTAL | Status: DC | PRN

## 2016-09-16 MED ORDER — ONDANSETRON HCL 4 MG/2ML IJ SOLN
4.0000 mg | Freq: Four times a day (QID) | INTRAMUSCULAR | Status: DC | PRN
  Administered 2016-09-16: 4 mg via INTRAVENOUS
  Filled 2016-09-16: qty 2

## 2016-09-16 MED ORDER — MEASLES, MUMPS & RUBELLA VAC ~~LOC~~ INJ
0.5000 mL | INJECTION | Freq: Once | SUBCUTANEOUS | Status: AC
  Administered 2016-09-18: 0.5 mL via SUBCUTANEOUS
  Filled 2016-09-16: qty 0.5

## 2016-09-16 MED ORDER — DIPHENHYDRAMINE HCL 25 MG PO CAPS: 25.0000 mg | ORAL_CAPSULE | Freq: Four times a day (QID) | ORAL | Status: DC | PRN

## 2016-09-16 MED ORDER — OXYCODONE-ACETAMINOPHEN 5-325 MG PO TABS: 1.0000 | ORAL_TABLET | ORAL | Status: DC | PRN

## 2016-09-16 MED ORDER — LEVOTHYROXINE SODIUM 25 MCG PO TABS
25.0000 ug | ORAL_TABLET | Freq: Every day | ORAL | Status: DC
  Administered 2016-09-17 – 2016-09-18 (×2): 25 ug via ORAL
  Filled 2016-09-16 (×2): qty 1

## 2016-09-16 MED ORDER — BENZOCAINE-MENTHOL 20-0.5 % EX AERO
1.0000 "application " | INHALATION_SPRAY | CUTANEOUS | Status: DC | PRN
  Administered 2016-09-17: 1 via TOPICAL
  Filled 2016-09-16 (×2): qty 56

## 2016-09-16 MED ORDER — OXYCODONE-ACETAMINOPHEN 5-325 MG PO TABS: 2.0000 | ORAL_TABLET | ORAL | Status: DC | PRN

## 2016-09-16 MED ORDER — PRENATAL MULTIVITAMIN CH
1.0000 | ORAL_TABLET | Freq: Every day | ORAL | Status: DC
  Administered 2016-09-17 – 2016-09-18 (×2): 1 via ORAL
  Filled 2016-09-16 (×2): qty 1

## 2016-09-16 MED ORDER — ACETAMINOPHEN 325 MG PO TABS
650.0000 mg | ORAL_TABLET | ORAL | Status: DC | PRN
  Administered 2016-09-16: 650 mg via ORAL
  Filled 2016-09-16: qty 2

## 2016-09-16 NOTE — Anesthesia Pain Management Evaluation Note (Signed)
  CRNA Pain Management Visit Note  Patient: Olivia Shepard, 25 y.o., female  "Hello I am a member of the anesthesia team at [facility]. We have an anesthesia team available at all times to provide care throughout the hospital, including epidural management and anesthesia for C-section. I don't know your plan for the delivery whether it a natural birth, water birth, IV sedation, nitrous supplementation, doula or epidural, but we want to meet your pain goals."   1.Was your pain managed to your expectations on prior hospitalizations?   Yes   2.What is your expectation for pain management during this hospitalization?     Nitrous Oxide  3.How can we help you reach that goal? Nursing support and Nitrous supplementation  Record the patient's initial score and the patient's pain goal.   Pain: 6  Pain Goal: 0 - 10   The [facility] wants you to be able to say your pain was always managed very well.  [facility] 09/16/2016

## 2016-09-16 NOTE — H&P (Signed)
Olivia Shepard is a 25 y.o. female presenting for active labor. Pt begun having regular contractions early this am. She was noted to be 6 cm dilated in office and on arrival at hospital she is now 7.5cm dil. Her prenatal course was benign; dating based on 8 week [place].  She is GBS neg  OB History    Gravida Para Term Preterm AB Living   2 1 1     1    SAB TAB Ectopic Multiple Live Births           1     Past Medical History:  Diagnosis Date  . Anemia   . GERD (gastroesophageal reflux disease)   . Thyroid disease    History reviewed. No pertinent surgical history. Family History: family history includes Diabetes in her mother. Social History:  reports that she has never smoked. She has never used smokeless tobacco. She reports that she does not use drugs. Her alcohol history is not on file.     Maternal Diabetes: No Genetic Screening: Normal Maternal Ultrasounds/Referrals: Normal Fetal Ultrasounds or other Referrals:  None Maternal Substance Abuse:  No Significant Maternal Medications:  None Significant Maternal Lab Results:  Lab values include: Group B Strep negative Other Comments:  hx thyroid disorder but stable in pregnancy  Review of Systems  Constitutional: Negative for chills, fever, malaise/fatigue and weight loss.  Eyes: Negative for blurred vision and double vision.  Respiratory: Negative for shortness of breath.   Cardiovascular: Negative for chest pain.  Gastrointestinal: Positive for abdominal pain. Negative for heartburn, nausea and vomiting.  Genitourinary: Negative for dysuria.  Skin: Negative for itching and rash.  Neurological: Negative for tingling and headaches.  Endo/Heme/Allergies: Does not bruise/bleed easily.  Psychiatric/Behavioral: Negative for depression, hallucinations, substance abuse and suicidal ideas. The patient is not nervous/anxious.    Maternal Medical History:  Reason for admission: Contractions.  Nausea.  Contractions: Onset was 3-5 hours  ago.   Frequency: regular.   Perceived severity is moderate.    Fetal activity: Perceived fetal activity is normal.   Last perceived fetal movement was within the past hour.    Prenatal Complications - Diabetes: none.    Dilation: 7.5 Effacement (%): 100 Station: 0 Exam by:: dr. Terri Piedra Blood pressure 126/77, pulse 75, temperature 98.3 F (36.8 C), temperature source Oral, resp. rate 20, height 5\' 7"  (1.702 m), weight 169 lb (76.7 kg), last menstrual period 12/14/2015. Maternal Exam:  Uterine Assessment: Contraction strength is moderate.  Contraction frequency is regular.   Abdomen: Patient reports generalized tenderness.  Estimated fetal weight is AGA.   Fetal presentation: vertex  Introitus: Normal vulva. Normal vagina.  Amniotic fluid character: clear.  Pelvis: adequate for delivery.   Cervix: Cervix evaluated by digital exam.     Physical Exam  Constitutional: She is oriented to person, place, and time. She appears well-developed and well-nourished.  Cardiovascular: Normal rate.   Respiratory: Effort normal.  GI: Soft. There is generalized tenderness.  Genitourinary: Vagina normal and uterus normal.  Musculoskeletal: Normal range of motion. She exhibits no edema.  Neurological: She is alert and oriented to person, place, and time.  Skin: Skin is warm.  Psychiatric: She has a normal mood and affect. Her behavior is normal. Judgment and thought content normal.    Prenatal labs: ABO, Rh: B/Positive/-- (04/06 0000) Antibody:   Rubella:   RPR:    HBsAg:    HIV: Non-reactive (08/14 0000)  GBS: Negative (10/06 0000)   Assessment/Plan: G2P1001 at 8 4/[redacted]wks  gestation in active labor GBS neg AROM performed with small amount of clear fluid return Nitrous oxide for pain relief Anticipate svd  Olivia Shepard Olivia Shepard 09/16/2016, 1:03 PM

## 2016-09-17 LAB — CBC
HCT: 32.4 % — ABNORMAL LOW (ref 36.0–46.0)
Hemoglobin: 11.1 g/dL — ABNORMAL LOW (ref 12.0–15.0)
MCH: 30.5 pg (ref 26.0–34.0)
MCHC: 34.3 g/dL (ref 30.0–36.0)
MCV: 89 fL (ref 78.0–100.0)
PLATELETS: 138 10*3/uL — AB (ref 150–400)
RBC: 3.64 MIL/uL — ABNORMAL LOW (ref 3.87–5.11)
RDW: 14.4 % (ref 11.5–15.5)
WBC: 8.8 10*3/uL (ref 4.0–10.5)

## 2016-09-17 LAB — RPR: RPR: NONREACTIVE

## 2016-09-17 NOTE — Progress Notes (Signed)
Post Partum Day 1 Subjective: no complaints and tolerating PO.  Feels sore and cramping is bad.  Nml lochia.  Objective: Blood pressure 106/69, pulse 80, temperature 98.8 F (37.1 C), temperature source Oral, resp. rate 16, height 5\' 7"  (1.702 m), weight 76.7 kg (169 lb), last menstrual period 12/14/2015, SpO2 98 %, unknown if currently breastfeeding.  Physical Exam:  General: alert and cooperative Lochia: appropriate Uterine Fundus: firm    Recent Labs  09/16/16 1145 09/17/16 0510  HGB 12.1 11.1*  HCT 35.3* 32.4*    Assessment/Plan: Plan for discharge tomorrow  Pt states is doing circumcision in the office.   LOS: 1 day   Zillah Alexie W 09/17/2016, 9:16 AM

## 2016-09-18 MED ORDER — PRENATAL MULTIVITAMIN CH: 1.0000 | ORAL_TABLET | Freq: Every day | ORAL | 3 refills | Status: DC

## 2016-09-18 MED ORDER — IBUPROFEN 800 MG PO TABS: 800.0000 mg | ORAL_TABLET | Freq: Three times a day (TID) | ORAL | 1 refills | Status: DC | PRN

## 2016-09-18 NOTE — Lactation Note (Signed)
This note was copied from a baby's chart. Lactation Consultation Note Experienced BF mom who is currently BF her 67 months old for comfort feeding. Mom was BF when [place] entered rm. In cross cradle position. Gave mom pillow for support of arms and baby. Had bobby under baby but wasn't enough support.  Breast filling, almost engorged, hard shelf of knots noted. Massage helpful. Noted improvement to breast after baby BF. Mom has great everted intact nipples.  Mom shown how to use DEBP & how to disassemble, clean, & reassemble parts. Mom pumped after BF. Breast massage to soften breast. Encouraged to lay down for breast to soften and massage upwards towards axillary, and apply ICE. ICE given to mom. Mom has DEBP at home. Engorgement information sheet given to mom.  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 Plans to build milk supply. Mom stated her milk supply went down to 2-3 oz. When  Pumped. Encouraged to lay flat soften if engorged. Engorgement information sheet given.  [place] brochure given w/resources, support groups and [place] services.   Patient Name: Boy Johnita Beals [id number] Date: 09/18/2016 Reason for consult: Initial assessment   Maternal Data Has patient been taught Hand Expression?: Yes Does the patient have breastfeeding experience prior to this delivery?: Yes  Feeding Feeding Type: Breast Fed Length of feed: 30 min  LATCH Score/Interventions Latch: Grasps breast easily, tongue down, lips flanged, rhythmical sucking. Intervention(s): Skin to skin;Teach feeding cues;Waking techniques  Audible Swallowing: Spontaneous and intermittent Intervention(s): Skin to skin;Hand expression;Alternate breast massage  Type of Nipple: Everted at rest and after stimulation  Comfort (Breast/Nipple): Engorged, cracked, bleeding, large blisters, severe discomfort Intervention(s): Ice;Hand expression  Problem noted: Severe discomfort;Filling Interventions (Filling):  Massage;Firm support;Frequent nursing;Double electric pump;Hand pump Interventions (Severe discomfort): Double electric pum;Observe pumping  Hold (Positioning): No assistance needed to correctly position infant at breast.  LATCH Score: 8  Lactation Tools Discussed/Used Tools: Pump Breast pump type: Double-Electric Breast Pump WIC Program: No Pump Review: Setup, frequency, and cleaning;Milk Storage Initiated by:: Allayne Stack RN IBCLC Date initiated:: 09/18/16   Consult Status Consult Status: Complete Date: 09/18/16    Theodoro Kalata 09/18/2016, 7:17 AM

## 2016-09-18 NOTE — Progress Notes (Signed)
Post Partum Day 2 Subjective: no complaints, up ad lib, voiding, tolerating PO and nl lochia, pain controlled  Objective: Blood pressure 102/65, pulse 70, temperature 97.8 F (36.6 C), temperature source Oral, resp. rate 18, height 5\' 7"  (1.702 m), weight 76.7 kg (169 lb), last menstrual period 12/14/2015, SpO2 98 %, unknown if currently breastfeeding.  Physical Exam:  General: alert and no distress Lochia: appropriate Uterine Fundus: firm   Recent Labs  09/16/16 1145 09/17/16 0510  HGB 12.1 11.1*  HCT 35.3* 32.4*    Assessment/Plan: Discharge home, Breastfeeding and Lactation consult.  Routine care.  D.c with motrin and PNV   LOS: 2 days   Bovard-Stuckert, Raphaela Cannaday 09/18/2016, 9:22 AM

## 2016-09-18 NOTE — Lactation Note (Signed)
This note was copied from a baby's chart. Lactation Consultation Note Mom and dad sleeping soundly. Will return later. Has been BF well. Patient Name: Olivia Shepard [id number] Date: 09/18/2016 Reason for consult: Initial assessment   Maternal Data Has patient been taught Hand Expression?: Yes Does the patient have breastfeeding experience prior to this delivery?: Yes  Feeding Feeding Type: Breast Fed Length of feed: 30 min  LATCH Score/Interventions Latch: Grasps breast easily, tongue down, lips flanged, rhythmical sucking. Intervention(s): Skin to skin;Teach feeding cues;Waking techniques  Audible Swallowing: Spontaneous and intermittent Intervention(s): Skin to skin;Hand expression;Alternate breast massage  Type of Nipple: Everted at rest and after stimulation  Comfort (Breast/Nipple): Engorged, cracked, bleeding, large blisters, severe discomfort Intervention(s): Ice;Hand expression  Problem noted: Severe discomfort;Filling Interventions (Filling): Massage;Firm support;Frequent nursing;Double electric pump;Hand pump Interventions (Severe discomfort): Double electric pum;Observe pumping  Hold (Positioning): No assistance needed to correctly position infant at breast.  LATCH Score: 8  Lactation Tools Discussed/Used Tools: Pump Breast pump type: Double-Electric Breast Pump WIC Program: No Pump Review: Setup, frequency, and cleaning;Milk Storage Initiated by:: Allayne Stack RN IBCLC Date initiated:: 09/18/16   Consult Status Consult Status: Complete Date: 09/18/16    Theodoro Kalata 09/18/2016, 7:37 AM

## 2016-09-18 NOTE — Discharge Summary (Signed)
OB Discharge Summary     Patient Name: Olivia Shepard DOB: [birth year] MRN: [id number]  Date of admission: 09/16/2016 Delivering MD: Carlynn Purl [facility]   Date of discharge: 09/18/2016  Admitting diagnosis: 39WKS,LABOR Intrauterine pregnancy: [redacted]w[redacted]d     Secondary diagnosis:  Active Problems:   Term pregnancy   SVD (spontaneous vaginal delivery)   Postpartum care following vaginal delivery  Additional problems: N/A     Discharge diagnosis: Term Pregnancy Delivered                                                                                                Post partum procedures:none  Augmentation: none  Complications: None  Hospital course:  Onset of Labor With Vaginal Delivery     25 y.o. yo [id number] at [redacted]w[redacted]d was admitted in Active Labor on 09/16/2016. Patient had an uncomplicated labor course as follows:  Membrane Rupture Time/Date: 12:44 PM ,09/16/2016   Intrapartum Procedures: Episiotomy: None [1]                                         Lacerations:  2nd degree [3]  Patient had a delivery of a Viable infant. 09/16/2016  Information for the patient's newborn:  Lakie, Arnold [id number]       Pateint had an uncomplicated postpartum course.  She is ambulating, tolerating a regular diet, passing flatus, and urinating well. Patient is discharged home in stable condition on 09/18/16.    Physical exam Vitals:   09/16/16 2230 09/17/16 0608 09/17/16 1835 09/18/16 0644  BP: (!) 99/55 106/69 114/84 102/65  Pulse: 76 80 83 70  Resp: 16 16 16 18   Temp: 98.1 F (36.7 C) 98.8 F (37.1 C) 98.1 F (36.7 C) 97.8 F (36.6 C)  TempSrc: Oral Oral Oral Oral  SpO2: 98%     Weight:      Height:       General: alert and no distress Lochia: appropriate Uterine Fundus: firm  Labs: Lab Results  Component Value Date   WBC 8.8 09/17/2016   HGB 11.1 (L) 09/17/2016   HCT 32.4 (L) 09/17/2016   MCV 89.0 09/17/2016   PLT 138 (L) 09/17/2016   CMP Latest Ref Rng & Units 12/27/2015   Glucose 65 - 99 mg/dL 71  BUN 7 - 25 mg/dL 12  Creatinine 0.50 - 1.10 mg/dL 0.55  Sodium 135 - 146 mmol/L 140  Potassium 3.5 - 5.3 mmol/L 3.7  Chloride 98 - 110 mmol/L 104  CO2 20 - 31 mmol/L 27  Calcium 8.6 - 10.2 mg/dL 9.3    Discharge instruction: per After Visit Summary and "Baby and Me Booklet".  After visit meds:    Medication List    TAKE these medications   esomeprazole 40 MG capsule Commonly known as:  NEXIUM Take 1 capsule (40 mg total) by mouth daily at 12 noon.   ibuprofen 800 MG tablet Commonly known as:  ADVIL,MOTRIN Take 1 tablet (800 mg total) by mouth every 8 (eight) hours as needed.  levothyroxine 25 MCG tablet Commonly known as:  LEVOTHROID Take 1 tablet (25 mcg total) by mouth daily before breakfast.   nystatin cream Commonly known as:  MYCOSTATIN Apply 1 application topically 2 (two) times daily.   prenatal multivitamin Tabs tablet Take 1 tablet by mouth daily at 12 noon.   salicylic acid-lactic acid 17 % external solution Apply 1 application topically daily.   triamcinolone cream 0.1 % Commonly known as:  KENALOG Apply to affected area twice daily for 2 weeks.   vitamin C 250 MG tablet Commonly known as:  ASCORBIC ACID Take 250 mg by mouth daily.       Diet: routine diet  Activity: Advance as tolerated. Pelvic rest for 6 weeks.   Outpatient follow up:6 weeks Follow up Appt:No future appointments. Follow up Visit:No Follow-up on file.  Postpartum contraception: Undecided  Newborn Data: Live born female  Birth Weight: 8 lb 10.5 oz (3925 g) APGAR: 9, 9  Baby Feeding: Breast Disposition:home with mother   09/18/2016 Janyth Contes, MD

## 2016-09-21 ENCOUNTER — Telehealth (HOSPITAL_COMMUNITY): Payer: Self-pay | Admitting: Lactation Services

## 2016-09-21 NOTE — Telephone Encounter (Signed)
Lactation Telephone Call:  [facility] returned phone call - Patient called due to engorgement issues and also several breast feeding questions. Baby was born on 11/7 and D/C on 11/9 . Per mom milk was in the day of D/C and was having problems  With engorgement and slow to be resolving. Has tried ice packs lying down , rest, pumping ( not getting much )  [address] and still a challenge.  LC reviewed engorgement tx and explained - Full breast are normal - and best time to feed the baby .  LC stressed the importance - if breast are to full to start need to release off 15 -20 ml off the 1st breast so the depth  At the breast can consistently be achieved. If the baby only feeds 10 mins and releases . And 15 -20 mins - showing feeding  Cues latch in a different position. ( breast compressions, firm support will enhance a deep latch and milk volume to baby, also  To resolve engorgement. Always soften 1st breast before offering the 2nd breast. If the baby only feeds 1st breast - release other breast  Down to comfort ( may be 7-10 mins).Hour later if the breast are filling back up and baby is sound asleep release down to comfort.  LC stressed the importance of watching the breast closely.  If breast are greater than full - firm or hard - need to ice for 15 -20 mins with adequate amount of ice ( frozen vegs on both breast ), lay down  While icing. Hand express, or pump off 1st breast 3-5 mins , so baby can comfortably latch, probably will have to pump 2nd breast down.  If the above not working - cold cabbage leaves sparely and ice on top 2 x's a day for 15 mins.  LC encouraged  Mom to call back if needed .

## 2016-09-25 ENCOUNTER — Encounter: Payer: Self-pay | Admitting: Family Medicine

## 2016-09-25 ENCOUNTER — Ambulatory Visit (INDEPENDENT_AMBULATORY_CARE_PROVIDER_SITE_OTHER): Payer: Medicaid Other | Admitting: Family Medicine

## 2016-09-25 VITALS — BP 112/89 | HR 84 | Temp 98.1°F | Ht 67.0 in | Wt 159.0 lb

## 2016-09-25 DIAGNOSIS — J029 Acute pharyngitis, unspecified: Secondary | ICD-10-CM | POA: Diagnosis not present

## 2016-09-25 DIAGNOSIS — J028 Acute pharyngitis due to other specified organisms: Secondary | ICD-10-CM

## 2016-09-25 DIAGNOSIS — B9789 Other viral agents as the cause of diseases classified elsewhere: Secondary | ICD-10-CM

## 2016-09-25 LAB — POCT RAPID STREP A (OFFICE): Rapid Strep A Screen: NEGATIVE

## 2016-09-25 NOTE — Assessment & Plan Note (Signed)
Patient had spontaneous vaginal delivery 9 days ago and required second-degree laceration.  Patient was concerned if she saw blood on toilet paper and thought that tissue looked like it was separating.  On exam there is a well-healed laceration on the inferior right labia majora.  - Discussed return precautions with patient

## 2016-09-25 NOTE — Progress Notes (Signed)
    Subjective:  Olivia Shepard is a 25 y.o. female who presents to the [facility] today with a chief complaint of sore throat  HPI:  Sore throat: Patient presenting with 1 day of sore throat, states it feels like she swelling razor blades. Patient does have a productive cough green sputum and reports cold-like symptoms for the past couple days.  Denies any fevers or swollen lymph nodes.  States that this pain feels very different from her esophagitis.   Laceration check: Patient had spontaneous vaginal delivery 9 days ago and required second-degree laceration repaired.  She is noting a small amount of blood when wiping and was concerned about the way the tissue looked.  Denies any fevers, excessive bleeding, erythema or drainage from the area.   PMH: GERD Tobacco use: None Medication: reviewed and updated ROS: see HPI   Objective:  Physical Exam: BP 112/89   Pulse 84   Temp 98.1 F (36.7 C) (Oral)   Ht 5\' 7"  (1.702 m)   Wt 159 lb (72.1 kg)   LMP 12/14/2015 (Approximate)   BMI 24.90 kg/m   Gen: 24yo F in NAD, resting comfortably HEENT: Erythematous oropharynx without exudates CV: RRR with no murmurs appreciated Pulm: NWOB, CTAB with no crackles, wheezes, or rhonchi GU: Normal-appearing external genitalia, well-healed laceration on inferior right labia majora  Results for orders placed or performed in visit on 09/25/16 (from the past 72 hour(s))  Rapid Strep A     Status: None   Collection Time: 09/25/16  1:58 PM  Result Value Ref Range   Rapid Strep A Screen Negative Negative     Assessment/Plan:  Sore throat (viral) Presenting with 1 day of sore throat, with productive cough without fevers.  Patient had erythematous oropharynx without exudates and was without lymphadenopathy.  Very likely to be viral upper respiratory infection, considering she is also having cough and congestion. - Should be self-limiting - Discussed return precautions  Postpartum care following vaginal  delivery Patient had spontaneous vaginal delivery 9 days ago and required second-degree laceration.  Patient was concerned if she saw blood on toilet paper and thought that tissue looked like it was separating.  On exam there is a well-healed laceration on the inferior right labia majora.  - Discussed return precautions with patient

## 2016-09-25 NOTE — Patient Instructions (Addendum)
Olivia Shepard, you were seen today for 2 days of sore throat, along with some cold symptoms.  We did a rapid strep test which came back negative and I believe this is likely due to a viral infection consistent with her other symptoms and this should go away on its own.  You can do salt water gargles and use Chloraseptic Spray as needed.    I also took a look at your laceration repair and this looks normal and I would not be concerned.  Just be careful with wiping and this should heal nicely on its own.  Very nice meeting you.  Congratulations!  Feel free to call the office with any concerns, Andre Gallego L. Rosalyn Gess, MD 09/25/2016 2:33 PM

## 2016-09-25 NOTE — Assessment & Plan Note (Signed)
Presenting with 1 day of sore throat, with productive cough without fevers.  Patient had erythematous oropharynx without exudates and was without lymphadenopathy.  Very likely to be viral upper respiratory infection, considering she is also having cough and congestion. - Should be self-limiting - Discussed return precautions

## 2016-12-02 ENCOUNTER — Ambulatory Visit (HOSPITAL_COMMUNITY)
Admission: RE | Admit: 2016-12-02 | Discharge: 2016-12-02 | Disposition: A | Discharge status: Home / Self Care | Payer: Medicaid Other | Source: Ambulatory Visit | Attending: Obstetrics and Gynecology | Admitting: Obstetrics and Gynecology

## 2016-12-02 ENCOUNTER — Other Ambulatory Visit: Payer: Self-pay | Admitting: Pediatrics

## 2016-12-02 DIAGNOSIS — Q381 Ankyloglossia: Secondary | ICD-10-CM

## 2016-12-02 NOTE — Progress Notes (Signed)
Referral to ENT for frenotomy.

## 2016-12-02 NOTE — Lactation Note (Signed)
Lactation Consult  Mother's reason for visit: Mother has concerns that infant has a posterior  tongue tie.   Visit Type:feeding assessment   Consult:  Initial Lactation Consultant:  Darla Lesches  ________________________________________________________________________    ________________________________________________________________________  Mother's Name: Olivia Shepard Type of delivery:  vag del Breastfeeding Experience: 20  Months of experience Maternal Medical Conditions:  Thyroid, PP anxiety Maternal Medications: levothyroxine, prenatal vits  ________________________________________________________________________  Breastfeeding History (Post Discharge)  Frequency of breastfeeding:every 1-2 hours Duration of feeding: 15-45 min    Pumping  Type of pump:  Medela pump in style Frequency:  Once daily Volume:  12 ounces  Infant Intake and Output Assessment  Voids: 10-12  in 24 hrs.  Color:  Clear yellow Stools:2-4   in 24 hrs.  Color:  Yellow  ________________________________________________________________________  Maternal Breast Assessment  Breast:  Full Nipple:  Erect Pain level:  0 Pain interventions:  Bra  _______________________________________________________________________ Feeding Assessment/Evaluation:  Mother latched infant on the (R) breast in cradle hold. For 5-10 mins infant sustained latch with observed suckling and swallows. Lips were not flanged top lip would not stay rolled up and bottom lip would not stay rolled down. Mother states that she trys to adjust lips without success.  Infant began to pull off breast repeatedly with some coughing and crying. Mother put infant back on each time he pulled off . This went on for 30 mins.  Mother has very full breast . Soft after feeding, observed sprays of milk after feeding. Mother states that infant has had this feeding pattern his entire life.  I feel that mother has a large amt of milk and  that she has a forceful let down with multiple let downs. I think that infant is not able to swallow fast enough and he pops off so he will not choke.    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 He had good lateral movement , limited elevation, but good extention of the tongue. His top lip is tight as well and he has a high bubble palate.   Mother has done an amazing job at feeding him with this problem.  Infant's oral assessment:  Variance, see above  Positioning:  Cradle Right breast  LATCH documentation:  Latch:  2 = Grasps breast easily, tongue down, lips flanged, rhythmical sucking.  Audible swallowing:  2 = Spontaneous and intermittent  Type of nipple:  2 = Everted at rest and after stimulation  Comfort (Breast/Nipple):  1 = Filling, red/small blisters or bruises, mild/mod discomfort  Hold (Positioning):  2 = No assistance needed to correctly position infant at breast  LATCH score:  9  Attached assessment:  Shallow  Lips flanged:  No.  Lips untucked:  No.  Suck assessment:  Displays both   Pre-feed weight:  6598, 14-8.7 Post-feed weight: 6770, 14-14.8  Amount transferred: 5.7 ounces    Total amount transferred: 172 ml  Mother to continue to feed infant   8-12 times in 24 hours Advised to try Block feeding to slow down milk flow Breastfeed on one breast for 2-3 feedings and the switch to other breast and do the same. Mother can try pumping for 2-3 mins before feeding Mother to apply pressure to breast when infant pops off (to slow down milk flow) Suggested to mother to see a Specialist for tongue evaluation. Mother to follow up with [facility] post revision.

## 2017-05-05 ENCOUNTER — Other Ambulatory Visit: Payer: Self-pay | Admitting: Internal Medicine

## 2017-05-05 DIAGNOSIS — K219 Gastro-esophageal reflux disease without esophagitis: Secondary | ICD-10-CM

## 2017-07-17 ENCOUNTER — Telehealth: Payer: Self-pay | Admitting: *Deleted

## 2017-07-17 NOTE — Telephone Encounter (Signed)
Received message on nurse line from patient requesting return call. Returned call. LM on patient's VM to return call. Hubbard Hartshorn, RN, BSN

## 2017-07-17 NOTE — Telephone Encounter (Signed)
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  C/o of burning and urgency.  Patient performed urine dipstick and showed moderate ketones, leukocytes, and large protein.  States she gets UTIs frequently and had "preventative abxs in the past".  Requesting a refill.  Patient will need office visit before abx can be given.  Called and left message on voicemail to call our office back.  Burna Forts, BSN, RN-BC

## 2017-07-31 ENCOUNTER — Other Ambulatory Visit: Payer: Self-pay | Admitting: Internal Medicine

## 2017-07-31 DIAGNOSIS — E039 Hypothyroidism, unspecified: Secondary | ICD-10-CM

## 2017-08-19 ENCOUNTER — Encounter: Payer: Self-pay | Admitting: Internal Medicine

## 2017-08-23 ENCOUNTER — Other Ambulatory Visit: Payer: Self-pay | Admitting: Internal Medicine

## 2017-08-23 DIAGNOSIS — K219 Gastro-esophageal reflux disease without esophagitis: Secondary | ICD-10-CM

## 2017-11-10 NOTE — L&D Delivery Note (Signed)
Delivery Note She progressed to complete and pushed well.  At 12:42 PM a viable female was delivered via Vaginal, Spontaneous (Presentation: vtx; LOA ).  APGAR: 9, 9; weight pending.   Placenta status: spontaneous, intact.  Cord:  with the following complications: none.   Anesthesia: Local, NO2  Episiotomy: None Lacerations:  2nd degree perineal Suture Repair: 3.0 vicryl rapide Est. Blood Loss (mL): 150  Mom to postpartum.  Baby to Couplet care / Skin to Skin.  Blane Ohara Hussam Muniz 04/06/2018, 1:06 PM

## 2017-11-13 LAB — OB RESULTS CONSOLE RUBELLA ANTIBODY, IGM: RUBELLA: IMMUNE

## 2017-11-16 LAB — OB RESULTS CONSOLE ABO/RH: RH Type: POSITIVE

## 2017-11-16 LAB — OB RESULTS CONSOLE HIV ANTIBODY (ROUTINE TESTING): HIV: NONREACTIVE

## 2017-11-16 LAB — OB RESULTS CONSOLE HEPATITIS B SURFACE ANTIGEN: Hepatitis B Surface Ag: NEGATIVE

## 2017-11-16 LAB — OB RESULTS CONSOLE ANTIBODY SCREEN: Antibody Screen: NEGATIVE

## 2017-11-16 LAB — OB RESULTS CONSOLE RPR: RPR: NONREACTIVE

## 2017-11-16 LAB — OB RESULTS CONSOLE GC/CHLAMYDIA
Chlamydia: NEGATIVE
Gonorrhea: NEGATIVE

## 2017-12-09 ENCOUNTER — Other Ambulatory Visit (HOSPITAL_COMMUNITY): Payer: Self-pay | Admitting: Obstetrics and Gynecology

## 2017-12-09 DIAGNOSIS — O283 Abnormal ultrasonic finding on antenatal screening of mother: Secondary | ICD-10-CM

## 2017-12-09 DIAGNOSIS — Z3689 Encounter for other specified antenatal screening: Secondary | ICD-10-CM

## 2017-12-09 DIAGNOSIS — Z3A24 24 weeks gestation of pregnancy: Secondary | ICD-10-CM

## 2017-12-15 ENCOUNTER — Encounter (HOSPITAL_COMMUNITY): Payer: Self-pay | Admitting: Obstetrics and Gynecology

## 2017-12-17 ENCOUNTER — Encounter (HOSPITAL_COMMUNITY): Payer: Self-pay | Admitting: *Deleted

## 2017-12-21 ENCOUNTER — Encounter: Payer: Self-pay | Admitting: Internal Medicine

## 2017-12-21 ENCOUNTER — Other Ambulatory Visit: Payer: Self-pay

## 2017-12-21 ENCOUNTER — Ambulatory Visit: Payer: Medicaid Other | Admitting: Internal Medicine

## 2017-12-21 VITALS — BP 104/68 | HR 89 | Temp 98.3°F | Ht 67.0 in | Wt 161.8 lb

## 2017-12-21 DIAGNOSIS — K219 Gastro-esophageal reflux disease without esophagitis: Secondary | ICD-10-CM | POA: Diagnosis not present

## 2017-12-21 DIAGNOSIS — Z8279 Family history of other congenital malformations, deformations and chromosomal abnormalities: Secondary | ICD-10-CM

## 2017-12-21 DIAGNOSIS — Z711 Person with feared health complaint in whom no diagnosis is made: Secondary | ICD-10-CM

## 2017-12-21 DIAGNOSIS — R635 Abnormal weight gain: Secondary | ICD-10-CM | POA: Diagnosis not present

## 2017-12-21 DIAGNOSIS — R002 Palpitations: Secondary | ICD-10-CM | POA: Diagnosis not present

## 2017-12-21 MED ORDER — ESOMEPRAZOLE MAGNESIUM 40 MG PO CPDR: 40.0000 mg | DELAYED_RELEASE_CAPSULE | Freq: Every day | ORAL | 4 refills | Status: DC

## 2017-12-21 MED FILL — ESOMEPRAZOLE MAG DR 40 MG C: 40 | 30 days supply | Qty: 30 | Fill #0 | Status: TO

## 2017-12-21 NOTE — Patient Instructions (Signed)
Ms. Amberli, Ruegg will hear about a cardiology referral within a week.  I will follow-up with you about your ECHO results.  It is fine for your to take nexium in pregnancy.  Best, Dr. Ola Spurr

## 2017-12-22 ENCOUNTER — Encounter (HOSPITAL_COMMUNITY): Payer: Self-pay

## 2017-12-22 ENCOUNTER — Ambulatory Visit (HOSPITAL_COMMUNITY): Admission: RE | Admit: 2017-12-22 | Payer: Medicaid Other | Source: Ambulatory Visit

## 2017-12-22 ENCOUNTER — Ambulatory Visit (HOSPITAL_COMMUNITY)
Admission: RE | Admit: 2017-12-22 | Discharge: 2017-12-22 | Disposition: A | Discharge status: Home / Self Care | Payer: Medicaid Other | Source: Ambulatory Visit | Attending: Obstetrics and Gynecology | Admitting: Obstetrics and Gynecology

## 2017-12-22 ENCOUNTER — Other Ambulatory Visit (HOSPITAL_COMMUNITY): Payer: Self-pay | Admitting: *Deleted

## 2017-12-22 ENCOUNTER — Encounter: Payer: Self-pay | Admitting: Internal Medicine

## 2017-12-22 DIAGNOSIS — O289 Unspecified abnormal findings on antenatal screening of mother: Secondary | ICD-10-CM | POA: Diagnosis not present

## 2017-12-22 DIAGNOSIS — Z3A24 24 weeks gestation of pregnancy: Secondary | ICD-10-CM

## 2017-12-22 DIAGNOSIS — Z363 Encounter for antenatal screening for malformations: Secondary | ICD-10-CM | POA: Diagnosis present

## 2017-12-22 DIAGNOSIS — O283 Abnormal ultrasonic finding on antenatal screening of mother: Secondary | ICD-10-CM

## 2017-12-22 DIAGNOSIS — Z362 Encounter for other antenatal screening follow-up: Secondary | ICD-10-CM

## 2017-12-22 DIAGNOSIS — Z711 Person with feared health complaint in whom no diagnosis is made: Secondary | ICD-10-CM | POA: Insufficient documentation

## 2017-12-22 DIAGNOSIS — Z3689 Encounter for other specified antenatal screening: Secondary | ICD-10-CM

## 2017-12-22 HISTORY — DX: Anxiety disorder, unspecified: F41.9

## 2017-12-22 HISTORY — DX: Hypothyroidism, unspecified: E03.9

## 2017-12-22 NOTE — Progress Notes (Signed)
[facility] Progress Note  Subjective:  Olivia Shepard is a 27 y.o. female who is [redacted] weeks pregnant with history of severe GERD and suspected vertiginous migraines who presents for concerns about her heart. She has been getting prenatal care through [facility] and says her OB recommended she see her PCP for further cardiac evaluation. She reports being more short of breath lately, especially over the last week. She normally exercises 4 times a week but has had to cut back. She has even gotten short of breath while sitting and reading a story to her child. She says she has gained 15 lbs in 2 weeks. She has history of very brief episodes of blackening of vision with associated nausea previously attributed to migraines but says they are happening more frequently--up to 4-6 times a day over the last week and sometimes occur with sensation of palpitations. Spells have happened both when sitting or standing. She is especially concerned because her paternal grandmother and father (in his 65s) have been diagnosed with afib and a bicuspid aortic valve and she is worried about developing peripartum cardiomyopathy. 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   Denies elevated BP readings during this pregnancy or abnormal fundal heights. Denies chest pain. Denies fevers.  She also requests switching from protonix to nexium for her reflux.   No Known Allergies  Social History   Tobacco Use  . Smoking status: Never Smoker  . Smokeless tobacco: Never Used  Substance Use Topics  . Alcohol use: No    Alcohol/week: 0.0 oz    Frequency: Never    Objective: Blood pressure 104/68, pulse 89, temperature 98.3 F (36.8 C), temperature source Oral, height 5\' 7"  (1.702 m), weight 161 lb 12.8 oz (73.4 kg), SpO2 99 %, unknown if currently breastfeeding. Body mass index is 25.34 kg/m. Constitutional: Well-appearing female in  NAD HENT: MMM Cardiovascular: RRR, S1, S2, no m/r/g.  Pulmonary/Chest: Effort normal and breath sounds normal.  Abdominal: Gravid, nontender Musculoskeletal: No LE edema Neurological: AOx3 Skin: Skin is warm and dry. No rash noted.  Psychiatric: Normal mood and affect.  Vitals reviewed  Patient showed recent labs from online patient portal: TSH 2.4, T4 10.3 on 12/07/17 Hemoglobin 12.9 on 09/22/17  Assessment/Plan: Concern about heart disease without diagnosis - Unclear etiology of reported symptoms. No obvious signs of heart failure: patient without abnormal heart sounds or LE edema on today's exam; able to lay flat easily and lungs CTAB. No elevated blood pressure readings to suggest preeclampsia that could explain visual symptoms. Recent blood work indicated normal thyroid function and hemoglobin level. Symptoms can occur in both seated and standing positions, so does not sound like orthostatic hypotension - Given family history, will order ECHO - Will refer to cardiology for consideration of holter monitoring to rule-out arrhythmia  - To continue close follow-up with her OB  Follow-up prn.  Olene Floss, MD [place], PGY-3

## 2017-12-22 NOTE — Assessment & Plan Note (Signed)
-   Unclear etiology of reported symptoms. No obvious signs of heart failure: patient without abnormal heart sounds or LE edema on today's exam; able to lay flat easily and lungs CTAB. No elevated blood pressure readings to suggest preeclampsia that could explain visual symptoms. Recent blood work indicated normal thyroid function and hemoglobin level. Symptoms can occur in both seated and standing positions, so does not sound like orthostatic hypotension - Given family history, will order ECHO - Will refer to cardiology for consideration of holter monitoring to rule-out arrhythmia  - To continue close follow-up with her OB

## 2017-12-23 ENCOUNTER — Ambulatory Visit (HOSPITAL_COMMUNITY)
Admission: RE | Admit: 2017-12-23 | Discharge: 2017-12-23 | Disposition: A | Discharge status: Home / Self Care | Payer: Medicaid Other | Source: Ambulatory Visit | Attending: Family Medicine | Admitting: Family Medicine

## 2017-12-23 DIAGNOSIS — Z3A00 Weeks of gestation of pregnancy not specified: Secondary | ICD-10-CM | POA: Insufficient documentation

## 2017-12-23 DIAGNOSIS — O26899 Other specified pregnancy related conditions, unspecified trimester: Secondary | ICD-10-CM | POA: Insufficient documentation

## 2017-12-23 DIAGNOSIS — R635 Abnormal weight gain: Secondary | ICD-10-CM

## 2017-12-23 DIAGNOSIS — I361 Nonrheumatic tricuspid (valve) insufficiency: Secondary | ICD-10-CM | POA: Diagnosis not present

## 2017-12-23 DIAGNOSIS — R002 Palpitations: Secondary | ICD-10-CM | POA: Diagnosis not present

## 2017-12-23 DIAGNOSIS — Z8279 Family history of other congenital malformations, deformations and chromosomal abnormalities: Secondary | ICD-10-CM

## 2017-12-23 NOTE — Progress Notes (Signed)
  Echocardiogram 2D Echocardiogram has been performed.  Olivia Shepard L Androw 12/23/2017, 3:46 PM

## 2017-12-23 NOTE — Progress Notes (Signed)
Cardiology Office Note   Date:  12/24/2017   ID:  Olivia Shepard, DOB 06/18/1991, MRN [id number]  PCP:  Rogue Bussing, MD  Cardiologist:   Jenkins Rouge, MD   No chief complaint on file.     History of Present Illness: Olivia Shepard is a 27 y.o. female who presents for consultation regarding dyspnea and palpitations. She is [redacted] weeks pregnant and has gained 15 lbs in the last 2 weeks. She describes episodes of blackening vision associated with nausea and has been diagnosed with vertiginous migraines. She has more exertional dyspnea She indicates that paternal grandmother and father had afib and bicuspid AV. She is concerned about peripartum DCM. The "migraines" have been increasing in frequency 4-6 x/day and can have palpitations with them. 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 Anemia   . Anxiety   . GERD (gastroesophageal reflux disease)   . Hypothyroidism   . Thyroid disease     Past Surgical History:  Procedure Laterality Date  . NO PAST SURGERIES       Current Outpatient Medications  Medication Sig Dispense Refill  . esomeprazole (NEXIUM) 40 MG capsule Take 1 capsule (40 mg total) by mouth daily at 12 noon. 30 capsule 4  . ibuprofen (ADVIL,MOTRIN) 800 MG tablet Take 1 tablet (800 mg total) by mouth every 8 (eight) hours as needed. 45 tablet 1  . levothyroxine (SYNTHROID, LEVOTHROID) 25 MCG tablet TAKE 1 TABLET BY MOUTH DAILY BEFORE BREAKFAST. 30 tablet 0  . Nystatin 1000000 units CAPS Take by mouth as directed.    .  Prenatal Vit-Fe Fumarate-FA (PRENATAL MULTIVITAMIN) TABS tablet Take 1 tablet by mouth daily at 12 noon. 100 tablet 3  . vitamin C (ASCORBIC ACID) 250 MG tablet Take 250 mg by mouth daily.     No current facility-administered medications for this visit.     Allergies:   Patient has no known allergies.    Social History:  The patient  reports that  has never smoked. she has never used smokeless tobacco. She reports that she does not drink alcohol or use drugs.   Family History:  The patient's family history includes Diabetes in her mother.    ROS:  Please see the history of present illness.   Otherwise, review of systems are positive for none.   All other systems are reviewed and negative.    PHYSICAL EXAM: VS:  BP 120/68   Pulse 75   Ht 5\' 7"  (1.702 m)   Wt 164 lb 4 oz (74.5 kg)   LMP  (LMP Unknown)   BMI 25.73 kg/m  , BMI Body mass index is 25.73 kg/m. Affect appropriate Healthy:  appears stated age 62: normal Neck supple with no adenopathy JVP normal no bruits no thyromegaly Lungs clear with no wheezing and good diaphragmatic motion Heart:  S1/S2 no murmur, no rub, gallop or click PMI normal Abdomen: benighn, BS positve, no tenderness, no AAA no bruit.  No HSM or HJR  Distal pulses intact with no bruits No edema Neuro non-focal Skin warm and dry No muscular weakness    EKG:  SR rate 75 normal    Recent Labs: No results found for requested labs within last 8760 hours.    Lipid Panel    Component Value Date/Time   CHOL 144 12/27/2015 1501   TRIG 32 12/27/2015 1501   HDL 83 12/27/2015 1501   CHOLHDL 1.7 12/27/2015 1501   VLDL 6 12/27/2015 1501   LDLCALC 55 12/27/2015 1501      Wt Readings from Last 3 Encounters:  12/24/17 164 lb 4 oz (74.5 kg)  12/22/17 163 lb 9.6 oz (74.2 kg)  12/21/17 161 lb 12.8 oz (73.4 kg)      Other studies Reviewed: Additional studies/ records that were reviewed today include: Notes from primary labs and echo  .    ASSESSMENT AND PLAN:  1.  Palpitations benign sounding will get 3 week event monitor ECG normal  2. Dyspnea functional due to weight gain of pregnancy normal exam and echo  3. Migraines likely exacerbated with hormonal changes pregnancy f/u neuro 4. GERD low carb diet worse with pregnancy  5. Thyroid    Current medicines are reviewed at length with the patient today.  The patient does not have concerns regarding medicines.  The following changes have been made:  no change  Labs/ tests ordered today include: Holter   Orders Placed This Encounter  Procedures  . CARDIAC EVENT MONITOR  . EKG 12-Lead     Disposition:   FU with cardiology PRN      Signed, Jenkins Rouge, MD  12/24/2017 11:32 AM    [facility] [address], [place], [place]  [address] Phone: [phone]; Fax: [phone]

## 2017-12-24 ENCOUNTER — Encounter: Payer: Self-pay | Admitting: Cardiovascular Disease

## 2017-12-24 ENCOUNTER — Ambulatory Visit (INDEPENDENT_AMBULATORY_CARE_PROVIDER_SITE_OTHER): Payer: Medicaid Other | Admitting: Cardiovascular Disease

## 2017-12-24 VITALS — BP 120/68 | HR 75 | Ht 67.0 in | Wt 164.2 lb

## 2017-12-24 DIAGNOSIS — R002 Palpitations: Secondary | ICD-10-CM | POA: Diagnosis not present

## 2017-12-24 DIAGNOSIS — R0602 Shortness of breath: Secondary | ICD-10-CM | POA: Diagnosis not present

## 2017-12-24 NOTE — Patient Instructions (Addendum)
Medication Instructions:  Your physician recommends that you continue on your current medications as directed. Please refer to the Current Medication list given to you today.  Labwork: NONE  Testing/Procedures: Your physician has recommended that you wear an event monitor in 3 weeks. Event monitors are medical devices that record the heart's electrical activity. Doctors most often [place] these monitors to diagnose arrhythmias. Arrhythmias are problems with the speed or rhythm of the heartbeat. The monitor is a small, portable device. You can wear one while you do your normal daily activities. This is usually used to diagnose what is causing palpitations/syncope (passing out).  Follow-Up: Your physician wants you to follow-up as needed with Dr. Johnsie Cancel.   If you need a refill on your cardiac medications before your next appointment, please call your pharmacy.

## 2017-12-29 ENCOUNTER — Telehealth: Payer: Self-pay

## 2017-12-29 NOTE — Telephone Encounter (Signed)
Patient called nurse line. Patient is currently 25w pregnant. Reports runny nose, sneezing, sore throat. Denies fever, states throat looks red but no white patches. Advised warm salt water gargles, Neti Pot with distilled water. Note routed to PCP for any further advice. Danley Danker, RN [facility] RN)

## 2018-01-12 ENCOUNTER — Encounter (HOSPITAL_COMMUNITY): Payer: Self-pay

## 2018-01-12 ENCOUNTER — Other Ambulatory Visit (HOSPITAL_COMMUNITY): Payer: Self-pay | Admitting: Obstetrics and Gynecology

## 2018-01-12 ENCOUNTER — Ambulatory Visit (HOSPITAL_COMMUNITY)
Admission: RE | Admit: 2018-01-12 | Discharge: 2018-01-12 | Disposition: A | Discharge status: Home / Self Care | Payer: Medicaid Other | Source: Ambulatory Visit | Attending: Obstetrics and Gynecology | Admitting: Obstetrics and Gynecology

## 2018-01-12 DIAGNOSIS — E039 Hypothyroidism, unspecified: Secondary | ICD-10-CM | POA: Diagnosis not present

## 2018-01-12 DIAGNOSIS — Z3A27 27 weeks gestation of pregnancy: Secondary | ICD-10-CM

## 2018-01-12 DIAGNOSIS — O99282 Endocrine, nutritional and metabolic diseases complicating pregnancy, second trimester: Secondary | ICD-10-CM

## 2018-01-12 DIAGNOSIS — O321XX0 Maternal care for breech presentation, not applicable or unspecified: Secondary | ICD-10-CM | POA: Insufficient documentation

## 2018-01-12 DIAGNOSIS — Z362 Encounter for other antenatal screening follow-up: Secondary | ICD-10-CM

## 2018-01-12 DIAGNOSIS — O09892 Supervision of other high risk pregnancies, second trimester: Secondary | ICD-10-CM | POA: Diagnosis not present

## 2018-01-12 DIAGNOSIS — O359XX0 Maternal care for (suspected) fetal abnormality and damage, unspecified, not applicable or unspecified: Secondary | ICD-10-CM | POA: Diagnosis present

## 2018-01-13 ENCOUNTER — Other Ambulatory Visit (HOSPITAL_COMMUNITY): Payer: Self-pay | Admitting: *Deleted

## 2018-01-13 DIAGNOSIS — O99283 Endocrine, nutritional and metabolic diseases complicating pregnancy, third trimester: Principal | ICD-10-CM

## 2018-01-13 DIAGNOSIS — E039 Hypothyroidism, unspecified: Secondary | ICD-10-CM

## 2018-01-14 ENCOUNTER — Ambulatory Visit (INDEPENDENT_AMBULATORY_CARE_PROVIDER_SITE_OTHER): Payer: Medicaid Other

## 2018-01-14 DIAGNOSIS — R002 Palpitations: Secondary | ICD-10-CM | POA: Diagnosis not present

## 2018-01-14 DIAGNOSIS — R0602 Shortness of breath: Secondary | ICD-10-CM | POA: Diagnosis not present

## 2018-02-23 ENCOUNTER — Encounter (HOSPITAL_COMMUNITY): Payer: Self-pay

## 2018-02-23 ENCOUNTER — Ambulatory Visit (HOSPITAL_COMMUNITY)
Admission: RE | Admit: 2018-02-23 | Discharge: 2018-02-23 | Disposition: A | Discharge status: Home / Self Care | Payer: Medicaid Other | Source: Ambulatory Visit | Attending: Obstetrics and Gynecology | Admitting: Obstetrics and Gynecology

## 2018-02-23 DIAGNOSIS — O99283 Endocrine, nutritional and metabolic diseases complicating pregnancy, third trimester: Secondary | ICD-10-CM | POA: Insufficient documentation

## 2018-02-23 DIAGNOSIS — E039 Hypothyroidism, unspecified: Secondary | ICD-10-CM | POA: Insufficient documentation

## 2018-02-23 DIAGNOSIS — Z3A33 33 weeks gestation of pregnancy: Secondary | ICD-10-CM | POA: Diagnosis not present

## 2018-03-08 LAB — OB RESULTS CONSOLE GBS: GBS: NEGATIVE

## 2018-03-18 ENCOUNTER — Other Ambulatory Visit: Payer: Self-pay

## 2018-03-18 ENCOUNTER — Ambulatory Visit: Payer: Medicaid Other | Admitting: Internal Medicine

## 2018-03-18 ENCOUNTER — Encounter: Payer: Self-pay | Admitting: Internal Medicine

## 2018-03-18 VITALS — BP 110/64 | HR 96 | Temp 98.0°F | Wt 176.4 lb

## 2018-03-18 DIAGNOSIS — B070 Plantar wart: Secondary | ICD-10-CM | POA: Diagnosis not present

## 2018-03-18 NOTE — Progress Notes (Signed)
[facility] Progress Note  Subjective:  Olivia Shepard is a 27 y.o. female who is 36w pregnant followed by outside [facility] who presents for wart on right 5th toe. It has been present for over a year but now seems to be spreading. She has tried several topical home treatments--home freezing solution and salicylic acid. She has not tried duct tape. She is interested in liquid nitrogen treatment today. ROS: Dry skin of feet, no fever  No Known Allergies  Social History   Tobacco Use  . Smoking status: Never Smoker  . Smokeless tobacco: Never Used  Substance Use Topics  . Alcohol use: No    Alcohol/week: 0.0 oz    Frequency: Never    Objective: Blood pressure 110/64, pulse 96, temperature 98 F (36.7 C), temperature source Oral, weight 176 lb 6.4 oz (80 kg), SpO2 98 %, unknown if currently breastfeeding. Body mass index is 27.63 kg/m. Constitutional: Well-appearing gravid female in NAD Skin: ~2cm callused area of plantar surface of right 5th toe with small papule below. Some flaking of dry skin between toes of left foot.  Vitals reviewed  Assessment/Plan: Plantar wart, right foot - Applied liquid nitrogen with cotton swab to callus of right 5th toe after discussing options for treatment and obtaining written consent from patient.  - Counseled patient she may need subsequent treatments - If no improvement in about 4-6 weeks, would reapply liquid nitrogen. Could also recommend occlusion with duct tape QHS.   Follow-up in about 1 month if no improvement.  Olene Floss, MD [place], PGY-3

## 2018-03-18 NOTE — Patient Instructions (Signed)
Ms. Oplinger,  It may take another administration to get rid of the wart. Come back in about 1 month if not much improved.  Try eucerin, cerave, or cetaphil creams to moisturize flaking skin.  Good luck with the last few weeks of your pregnancy!  Best, Dr. Ola Spurr

## 2018-03-18 NOTE — Assessment & Plan Note (Signed)
-   Applied liquid nitrogen with cotton swab to callus of right 5th toe after discussing options for treatment and obtaining written consent from patient.  - Counseled patient she may need subsequent treatments - If no improvement in about 4-6 weeks, would reapply liquid nitrogen. Could also recommend occlusion with duct tape QHS.

## 2018-04-06 ENCOUNTER — Encounter (HOSPITAL_COMMUNITY): Payer: Self-pay

## 2018-04-06 ENCOUNTER — Inpatient Hospital Stay (HOSPITAL_COMMUNITY)
Admission: AD | Admit: 2018-04-06 | Discharge: 2018-04-07 | DRG: 807 | Disposition: A | Discharge status: Home / Self Care | Payer: Medicaid Other | Source: Ambulatory Visit | Attending: Obstetrics and Gynecology | Admitting: Obstetrics and Gynecology

## 2018-04-06 ENCOUNTER — Other Ambulatory Visit: Payer: Self-pay

## 2018-04-06 DIAGNOSIS — E039 Hypothyroidism, unspecified: Secondary | ICD-10-CM | POA: Diagnosis present

## 2018-04-06 DIAGNOSIS — O99284 Endocrine, nutritional and metabolic diseases complicating childbirth: Principal | ICD-10-CM | POA: Diagnosis present

## 2018-04-06 DIAGNOSIS — O9962 Diseases of the digestive system complicating childbirth: Secondary | ICD-10-CM | POA: Diagnosis present

## 2018-04-06 DIAGNOSIS — Z3A39 39 weeks gestation of pregnancy: Secondary | ICD-10-CM | POA: Diagnosis not present

## 2018-04-06 DIAGNOSIS — Z3483 Encounter for supervision of other normal pregnancy, third trimester: Secondary | ICD-10-CM | POA: Diagnosis present

## 2018-04-06 DIAGNOSIS — K219 Gastro-esophageal reflux disease without esophagitis: Secondary | ICD-10-CM | POA: Diagnosis present

## 2018-04-06 LAB — BPAM RBC
BLOOD PRODUCT EXPIRATION DATE: 201906202359
Blood Product Expiration Date: 201906202359
UNIT TYPE AND RH: 5100
UNIT TYPE AND RH: 5100

## 2018-04-06 LAB — CBC
HCT: 34.8 % — ABNORMAL LOW (ref 36.0–46.0)
HEMATOCRIT: 34.1 % — AB (ref 36.0–46.0)
HEMOGLOBIN: 10.9 g/dL — AB (ref 12.0–15.0)
Hemoglobin: 11.3 g/dL — ABNORMAL LOW (ref 12.0–15.0)
MCH: 28.8 pg (ref 26.0–34.0)
MCH: 28.5 pg (ref 26.0–34.0)
MCHC: 32.5 g/dL (ref 30.0–36.0)
MCHC: 32 g/dL (ref 30.0–36.0)
MCV: 88.5 fL (ref 78.0–100.0)
MCV: 89.3 fL (ref 78.0–100.0)
Platelets: 166 10*3/uL (ref 150–400)
Platelets: 155 10*3/uL (ref 150–400)
RBC: 3.93 MIL/uL (ref 3.87–5.11)
RBC: 3.82 MIL/uL — ABNORMAL LOW (ref 3.87–5.11)
RDW: 14.6 % (ref 11.5–15.5)
RDW: 14.5 % (ref 11.5–15.5)
WBC: 6.8 10*3/uL (ref 4.0–10.5)
WBC: 12.8 10*3/uL — ABNORMAL HIGH (ref 4.0–10.5)

## 2018-04-06 LAB — TYPE AND SCREEN
ABO/RH(D): B POS
Antibody Screen: NEGATIVE
UNIT DIVISION: 0
Unit division: 0

## 2018-04-06 LAB — RPR: RPR: NONREACTIVE

## 2018-04-06 LAB — POCT FERN TEST: POCT Fern Test: POSITIVE

## 2018-04-06 MED ORDER — OXYTOCIN 40 UNITS IN LACTATED RINGERS INFUSION - SIMPLE MED
1.0000 m[IU]/min | INTRAVENOUS | Status: DC
  Administered 2018-04-06: 1 m[IU]/min via INTRAVENOUS
  Filled 2018-04-06: qty 1000

## 2018-04-06 MED ORDER — COCONUT OIL OIL: 1.0000 "application " | TOPICAL_OIL | Status: DC | PRN

## 2018-04-06 MED ORDER — TETANUS-DIPHTH-ACELL PERTUSSIS 5-2.5-18.5 LF-MCG/0.5 IM SUSP: 0.5000 mL | Freq: Once | INTRAMUSCULAR | Status: DC

## 2018-04-06 MED ORDER — ZOLPIDEM TARTRATE 5 MG PO TABS: 5.0000 mg | ORAL_TABLET | Freq: Every evening | ORAL | Status: DC | PRN

## 2018-04-06 MED ORDER — TERBUTALINE SULFATE 1 MG/ML IJ SOLN
0.2500 mg | Freq: Once | INTRAMUSCULAR | Status: DC | PRN
  Filled 2018-04-06: qty 1

## 2018-04-06 MED ORDER — MAGNESIUM HYDROXIDE 400 MG/5ML PO SUSP: 30.0000 mL | ORAL | Status: DC | PRN

## 2018-04-06 MED ORDER — PRENATAL MULTIVITAMIN CH
1.0000 | ORAL_TABLET | Freq: Every day | ORAL | Status: DC
  Administered 2018-04-07: 1 via ORAL
  Filled 2018-04-06: qty 1

## 2018-04-06 MED ORDER — SENNOSIDES-DOCUSATE SODIUM 8.6-50 MG PO TABS
2.0000 | ORAL_TABLET | ORAL | Status: DC
  Administered 2018-04-07: 2 via ORAL
  Filled 2018-04-06: qty 2

## 2018-04-06 MED ORDER — SOD CITRATE-CITRIC ACID 500-334 MG/5ML PO SOLN: 30.0000 mL | ORAL | Status: DC | PRN

## 2018-04-06 MED ORDER — FLEET ENEMA 7-19 GM/118ML RE ENEM: 1.0000 | ENEMA | Freq: Every day | RECTAL | Status: DC | PRN

## 2018-04-06 MED ORDER — METHYLERGONOVINE MALEATE 0.2 MG PO TABS: 0.2000 mg | ORAL_TABLET | ORAL | Status: DC | PRN

## 2018-04-06 MED ORDER — OXYCODONE-ACETAMINOPHEN 5-325 MG PO TABS: 1.0000 | ORAL_TABLET | ORAL | Status: DC | PRN

## 2018-04-06 MED ORDER — WITCH HAZEL-GLYCERIN EX PADS: 1.0000 "application " | MEDICATED_PAD | CUTANEOUS | Status: DC | PRN

## 2018-04-06 MED ORDER — ONDANSETRON HCL 4 MG/2ML IJ SOLN
4.0000 mg | Freq: Four times a day (QID) | INTRAMUSCULAR | Status: DC | PRN
  Administered 2018-04-06: 4 mg via INTRAVENOUS
  Filled 2018-04-06: qty 2

## 2018-04-06 MED ORDER — ACETAMINOPHEN 325 MG PO TABS
650.0000 mg | ORAL_TABLET | ORAL | Status: DC | PRN
  Administered 2018-04-06 – 2018-04-07 (×6): 650 mg via ORAL
  Filled 2018-04-06 (×8): qty 2

## 2018-04-06 MED ORDER — OXYTOCIN 40 UNITS IN LACTATED RINGERS INFUSION - SIMPLE MED
INTRAVENOUS | Status: AC
  Filled 2018-04-06: qty 1000

## 2018-04-06 MED ORDER — METHYLERGONOVINE MALEATE 0.2 MG/ML IJ SOLN: 0.2000 mg | INTRAMUSCULAR | Status: DC | PRN

## 2018-04-06 MED ORDER — BENZOCAINE-MENTHOL 20-0.5 % EX AERO
1.0000 "application " | INHALATION_SPRAY | CUTANEOUS | Status: DC | PRN
  Administered 2018-04-06: 1 via TOPICAL
  Filled 2018-04-06: qty 56

## 2018-04-06 MED ORDER — ONDANSETRON HCL 4 MG PO TABS: 4.0000 mg | ORAL_TABLET | ORAL | Status: DC | PRN

## 2018-04-06 MED ORDER — IBUPROFEN 600 MG PO TABS
600.0000 mg | ORAL_TABLET | Freq: Four times a day (QID) | ORAL | Status: DC
  Administered 2018-04-06 – 2018-04-07 (×5): 600 mg via ORAL
  Filled 2018-04-06 (×6): qty 1

## 2018-04-06 MED ORDER — OXYCODONE-ACETAMINOPHEN 5-325 MG PO TABS: 2.0000 | ORAL_TABLET | ORAL | Status: DC | PRN

## 2018-04-06 MED ORDER — OXYTOCIN BOLUS FROM INFUSION
500.0000 mL | Freq: Once | INTRAVENOUS | Status: AC
  Administered 2018-04-06: 500 mL via INTRAVENOUS

## 2018-04-06 MED ORDER — OXYCODONE HCL 5 MG PO TABS
5.0000 mg | ORAL_TABLET | ORAL | Status: DC | PRN
  Administered 2018-04-06 – 2018-04-07 (×4): 5 mg via ORAL
  Filled 2018-04-06 (×5): qty 1

## 2018-04-06 MED ORDER — OXYTOCIN 40 UNITS IN LACTATED RINGERS INFUSION - SIMPLE MED: 2.5000 [IU]/h | INTRAVENOUS | Status: DC

## 2018-04-06 MED ORDER — FENTANYL CITRATE (PF) 100 MCG/2ML IJ SOLN
INTRAMUSCULAR | Status: AC
  Administered 2018-04-06: 100 ug
  Filled 2018-04-06: qty 2

## 2018-04-06 MED ORDER — ACETAMINOPHEN 325 MG PO TABS: 650.0000 mg | ORAL_TABLET | ORAL | Status: DC | PRN

## 2018-04-06 MED ORDER — LEVOTHYROXINE SODIUM 25 MCG PO TABS
25.0000 ug | ORAL_TABLET | Freq: Every day | ORAL | Status: DC
  Administered 2018-04-07: 25 ug via ORAL
  Filled 2018-04-06 (×2): qty 1

## 2018-04-06 MED ORDER — MEASLES, MUMPS & RUBELLA VAC ~~LOC~~ INJ
0.5000 mL | INJECTION | Freq: Once | SUBCUTANEOUS | Status: DC
  Filled 2018-04-06: qty 0.5

## 2018-04-06 MED ORDER — LACTATED RINGERS IV SOLN: INTRAVENOUS | Status: DC

## 2018-04-06 MED ORDER — DIBUCAINE 1 % RE OINT: 1.0000 "application " | TOPICAL_OINTMENT | RECTAL | Status: DC | PRN

## 2018-04-06 MED ORDER — DIPHENHYDRAMINE HCL 25 MG PO CAPS: 25.0000 mg | ORAL_CAPSULE | Freq: Four times a day (QID) | ORAL | Status: DC | PRN

## 2018-04-06 MED ORDER — SIMETHICONE 80 MG PO CHEW: 80.0000 mg | CHEWABLE_TABLET | ORAL | Status: DC | PRN

## 2018-04-06 MED ORDER — MISOPROSTOL 200 MCG PO TABS
ORAL_TABLET | ORAL | Status: AC
  Filled 2018-04-06: qty 5

## 2018-04-06 MED ORDER — CARBOPROST TROMETHAMINE 250 MCG/ML IM SOLN
INTRAMUSCULAR | Status: AC
  Filled 2018-04-06: qty 1

## 2018-04-06 MED ORDER — LIDOCAINE HCL (PF) 1 % IJ SOLN
30.0000 mL | INTRAMUSCULAR | Status: DC | PRN
  Administered 2018-04-06: 30 mL via SUBCUTANEOUS
  Filled 2018-04-06: qty 30

## 2018-04-06 MED ORDER — FENTANYL CITRATE (PF) 100 MCG/2ML IJ SOLN
100.0000 ug | Freq: Once | INTRAMUSCULAR | Status: AC
  Administered 2018-04-06: 100 ug via INTRAVENOUS
  Filled 2018-04-06: qty 2

## 2018-04-06 MED ORDER — METHYLERGONOVINE MALEATE 0.2 MG/ML IJ SOLN
0.2000 mg | Freq: Once | INTRAMUSCULAR | Status: AC
  Administered 2018-04-06: 0.2 mg via INTRAMUSCULAR

## 2018-04-06 MED ORDER — ONDANSETRON HCL 4 MG/2ML IJ SOLN: 4.0000 mg | INTRAMUSCULAR | Status: DC | PRN

## 2018-04-06 MED ORDER — FENTANYL CITRATE (PF) 100 MCG/2ML IJ SOLN
INTRAMUSCULAR | Status: AC
  Filled 2018-04-06: qty 4

## 2018-04-06 MED ORDER — OXYCODONE HCL 5 MG PO TABS
10.0000 mg | ORAL_TABLET | ORAL | Status: DC | PRN
  Administered 2018-04-06 – 2018-04-07 (×3): 10 mg via ORAL
  Filled 2018-04-06 (×5): qty 2

## 2018-04-06 MED ORDER — METHYLERGONOVINE MALEATE 0.2 MG/ML IJ SOLN
INTRAMUSCULAR | Status: AC
  Administered 2018-04-06: 0.2 mg via INTRAMUSCULAR
  Filled 2018-04-06: qty 3

## 2018-04-06 MED ORDER — LACTATED RINGERS IV SOLN: 500.0000 mL | INTRAVENOUS | Status: DC | PRN

## 2018-04-06 NOTE — Lactation Note (Signed)
This note was copied from a baby's chart. Lactation Consultation Note Baby 46 hrs old. Mom states BF well. Mom states baby spitting.  Mom had PPH very tired. Mom's 3rd baby. Mom BF 1st child 18 months, 2nd child over a yr.  Denies need for assistance at this time. LC offered pumping since PPH, mom stated with her last child was engorged 2 day pumped out 60 ml. At this time didn't want to pump unless needs to relieve breast from milk. encouraged to call for assistance if needed and not to let breast get engorged. [place] brochure given w/resources, support groups and [place] services.  Patient Name: Olivia Shepard CEYEM'V Date: 04/06/2018 Reason for consult: Initial assessment   Maternal Data Has patient been taught Hand Expression?: Yes Does the patient have breastfeeding experience prior to this delivery?: Yes  Feeding Feeding Type: Breast Fed Length of feed: 10 min  LATCH Score                   Interventions Interventions: Breast feeding basics reviewed  Lactation Tools Discussed/Used WIC Program: No   Consult Status Consult Status: PRN Date: 04/07/18 Follow-up type: In-patient    Theodoro Kalata 04/06/2018, 10:28 PM

## 2018-04-06 NOTE — Progress Notes (Signed)
Feeling a few ctx Afeb, VSS FHT- Cat I, occ ctx VE-4/70/-1, vtx, attempted AROM of possible forebag, no additional fluid Discussed options, ROM for almost 12 hours and not in labor, she is agreeable to low dose pitocin, will start and monitor progress

## 2018-04-06 NOTE — Anesthesia Pain Management Evaluation Note (Signed)
  CRNA Pain Management Visit Note  Patient: Olivia Shepard, 27 y.o., female   "Hello I am a member of the anesthesia team at [facility]. We have an anesthesia team available at all times to provide care throughout the hospital, including epidural management and anesthesia for C-section. I don't know your plan for the delivery whether it a natural birth, water birth, IV sedation, nitrous supplementation, doula or epidural, but we want to meet your pain goals."   1.Was your pain managed to your expectations on prior hospitalizations?   Yes   2.What is your expectation for pain management during this hospitalization?     Nitrous Oxide  3.How can we help you reach that goal? planning  Record the patient's initial score and the patient's pain goal.   Pain: 0  Pain Goal: 5 The [facility] wants you to be able to say your pain was always managed very well.  [facility] 04/06/2018

## 2018-04-06 NOTE — Progress Notes (Signed)
Patient called out and said that she felt like she was bleeding a lot. RN pulled back the covers and patient was sitting in blood clots. Patient was laid back and fundus was found to me boggy and to the right. RN called for assistance, code hemorrhage was called, and Dr. Willis Modena was notified. PP Hemorrhage protocol was initiated.  RN was able to firm fundus with massage and with expelling clots. IM Methergine was given and Dr. Willis Modena manually removed additional clots. Vital signs remained stable during the entire hemorrhage and she was alert and oriented with husband at bedside. Patient then voided 755ml on the bedpan. Timoteo Ace, RN

## 2018-04-06 NOTE — H&P (Signed)
Olivia Shepard is a 27 y.o. female G3P2002 at 70 1/7 weeks (EDD 04/12/18 by a 7 week [place])  presenting for SROM.  Pt called the after hours line at around midnight reporting she had ROM around 800pm but was not contracting and did not want to come into hospital.  I encouraged her to go ahead and come in and she arrived about 3am, still having no significant contractions. She transferred to our office at 21 weeks.  Prenatal care complicted by hypothyroidism with labs stable at 28 weeks.  On her initial [place] at our office there was some question of possible double bubble sign, but this was subsequently cleared by MFM.  She had a history of pp depression in past requiring zoloft and may want to resume that postpartum with this delivery.   OB History    Gravida  3   Para  2   Term  2   Preterm      AB      Living  2     SAB      TAB      Ectopic      Multiple  0   Live Births  2         NSVD 2016 7#8oz NSVD 2017 8#10oz  Past Medical History:  Diagnosis Date  . Anemia   . Anxiety   . GERD (gastroesophageal reflux disease)   . Hypothyroidism   . Thyroid disease    Past Surgical History:  Procedure Laterality Date  . NO PAST SURGERIES     Family History: family history includes Diabetes in her mother. Social History:  reports that she has never smoked. She has never used smokeless tobacco. She reports that she does not drink alcohol or use drugs.     Maternal Diabetes: No Genetic Screening: Normal Maternal Ultrasounds/Referrals: Normal Fetal Ultrasounds or other Referrals:  Referred to Materal Fetal Medicine  Maternal Substance Abuse:  No Significant Maternal Medications:  Meds include: Syntroid Significant Maternal Lab Results:  None Other Comments:  None  Review of Systems  Gastrointestinal: Negative for abdominal pain.  Neurological: Negative for headaches.   Maternal Medical History:  Reason for admission: Rupture of membranes.   Contractions: Onset was 6-12  hours ago.   Frequency: irregular.   Perceived severity is mild.    Fetal activity: Perceived fetal activity is normal.    Prenatal Complications - Diabetes: none.    Dilation: (deferred) Blood pressure 121/73, pulse 87, temperature 98.1 F (36.7 C), temperature source Oral, resp. rate 16, height 5\' 7"  (1.702 m), weight 81.6 kg (180 lb), SpO2 98 %, unknown if currently breastfeeding. Maternal Exam:  Uterine Assessment: Contraction strength is mild.  Contraction frequency is irregular.   Abdomen: Patient reports no abdominal tenderness. Fetal presentation: vertex     Physical Exam  Constitutional: She appears well-developed.  Cardiovascular: Normal rate and regular rhythm.  Respiratory: Effort normal.  Genitourinary: Vagina normal.  Musculoskeletal: Normal range of motion.  Neurological: She is alert.  Skin: Skin is warm.  Psychiatric: She has a normal mood and affect.    Prenatal labs: ABO, Rh: --/--/B POS (05/28 0340) Antibody: NEG (05/28 0340) Rubella: Immune (01/04 0000) RPR: Nonreactive (01/07 0000)  HBsAg: Negative (01/07 0000)  HIV: Non-reactive (01/07 0000)  GBS: Negative (04/29 0000)  One hour GCT WNL Trisomy screening negative at prior practice  Assessment/Plan: Pt admitted for SROM with no onset of labor.  SHe declines pitocin and cervical exam.  Bedside US confirms vertex.  FHR category 1.   Logan Bores 04/06/2018, 5:56 AM

## 2018-04-06 NOTE — MAU Note (Signed)
Pt states water broke at 8 pm Monday night. Was contracting q3 mins before arriving at MAU.

## 2018-04-07 LAB — CBC
HEMATOCRIT: 28.3 % — AB (ref 36.0–46.0)
Hemoglobin: 9.2 g/dL — ABNORMAL LOW (ref 12.0–15.0)
MCH: 29.2 pg (ref 26.0–34.0)
MCHC: 32.5 g/dL (ref 30.0–36.0)
MCV: 89.8 fL (ref 78.0–100.0)
Platelets: 137 10*3/uL — ABNORMAL LOW (ref 150–400)
RBC: 3.15 MIL/uL — ABNORMAL LOW (ref 3.87–5.11)
RDW: 14.7 % (ref 11.5–15.5)
WBC: 8 10*3/uL (ref 4.0–10.5)

## 2018-04-07 MED ORDER — PRENATAL MULTIVITAMIN CH: 1.0000 | ORAL_TABLET | Freq: Every day | ORAL | 3 refills | Status: DC

## 2018-04-07 MED ORDER — SERTRALINE HCL 25 MG PO TABS
25.0000 mg | ORAL_TABLET | Freq: Every day | ORAL | Status: DC
  Administered 2018-04-07: 25 mg via ORAL
  Filled 2018-04-07 (×2): qty 1

## 2018-04-07 MED ORDER — SERTRALINE HCL 25 MG PO TABS
25.0000 mg | ORAL_TABLET | Freq: Every day | ORAL | Status: DC
  Filled 2018-04-07 (×2): qty 1

## 2018-04-07 MED ORDER — SERTRALINE HCL 50 MG PO TABS: 50.0000 mg | ORAL_TABLET | Freq: Every day | ORAL | 3 refills | Status: DC

## 2018-04-07 MED ORDER — PANTOPRAZOLE SODIUM 40 MG PO TBEC
40.0000 mg | DELAYED_RELEASE_TABLET | Freq: Every day | ORAL | Status: DC
  Administered 2018-04-07: 40 mg via ORAL
  Filled 2018-04-07: qty 1

## 2018-04-07 MED ORDER — IBUPROFEN 600 MG PO TABS: 600.0000 mg | ORAL_TABLET | Freq: Four times a day (QID) | ORAL | 1 refills | Status: DC | PRN

## 2018-04-07 NOTE — Progress Notes (Addendum)
Post Partum Day 1 Subjective: no complaints, up ad lib, voiding, tolerating PO and nl lochia, pain controlled.  Has h/o PP anxiety - desires Zoloft.  Also d/o GERD  Objective: Blood pressure 110/82, pulse 79, temperature 98.8 F (37.1 C), temperature source Oral, resp. rate 18, height 5\' 7"  (1.702 m), weight 81.6 kg (180 lb), SpO2 100 %, unknown if currently breastfeeding.  Physical Exam:  General: alert and no distress Lochia: appropriate Uterine Fundus: firm  Recent Labs    04/06/18 0340 04/06/18 1517  HGB 11.3* 10.9*  HCT 34.8* 34.1*    Assessment/Plan: Plan for discharge tomorrow, Breastfeeding and Lactation consult.  Routine PP care.  Will start Zoloft 25mg  qday - increase to 50 after 1 week.  Also restart Nexium.    Desires possible d/c to home this PM if baby ready to go.     LOS: 1 day   Olivia Shepard 04/07/2018, 7:43 AM

## 2018-04-07 NOTE — Progress Notes (Signed)
Pt called out and stated she felt dizzy and hot.  BP low, encouraged to drink more fluids, and rest.  MD notified, Stat CBC ordered.

## 2018-04-07 NOTE — Lactation Note (Signed)
This note was copied from a baby's chart. Lactation Consultation Note Baby 18 hrs. Old. Mom called out for latch pain. Mom stated baby feels as if chomping and biting. Mom states her 57 month old had a tongue tie, had to have it fixed at 30 days old.  Noted baby has thick upper labial frenulum. Baby can stick her tongue out past gum line. Tongue is pointed w/heart shape to the bottom of the tongue. Baby tongue thrust/ Baby has had 4 stools,3 voids, 1 emesis documented w/6% weight loss. [place] questions transfer. Reported to on coming [place].  Encouraged mom to discuss w/MD to assess.   Patient Name: Girl Mazzie Brodrick [id number] Date: 04/07/2018 Reason for consult: Mother's request   Maternal Data    Feeding Feeding Type: Breast Fed Length of feed: 10 min  LATCH Score Latch: Repeated attempts needed to sustain latch, nipple held in mouth throughout feeding, stimulation needed to elicit sucking reflex.  Audible Swallowing: Spontaneous and intermittent  Type of Nipple: Everted at rest and after stimulation  Comfort (Breast/Nipple): Soft / non-tender  Hold (Positioning): Assistance needed to correctly position infant at breast and maintain latch.  LATCH Score: 8  Interventions Interventions: Adjust position  Lactation Tools Discussed/Used     Consult Status Consult Status: Follow-up Date: 04/07/18 Follow-up type: In-patient    Kester Stimpson, Elta Gu[place] 04/07/2018, 7:09 AM

## 2018-04-07 NOTE — Progress Notes (Signed)
Olivia Shepard was referred for history of depression/anxiety. * Referral screened out by Clinical Social Worker because none of the following criteria appear to apply: ~ History of anxiety/depression during this pregnancy, or of post-partum depression. ~ Diagnosis of anxiety and/or depression within last 3 years OR * Olivia Shepard's symptoms currently being treated with medication and/or therapy. Olivia Shepard currently taking Zoloft to manage symptoms.   Please contact the Clinical Social Worker if needs arise, by [facility] request, or if Olivia Shepard scores greater than 9/yes to question 10 on [place] Postpartum Depression Screen.  Laurey Arrow, MSW, LCSW Clinical Social Work [phone]

## 2018-04-07 NOTE — Discharge Summary (Signed)
OB Discharge Summary     Patient Name: Olivia Shepard DOB: Nov 16, 1990 MRN: [id number]  Date of admission: 04/06/2018 Delivering MD: Willis Modena, TODD   Date of discharge: 04/07/2018  Admitting diagnosis: 39WKS WATER BROKE CTX 3-5MINS Intrauterine pregnancy: [redacted]w[redacted]d     Secondary diagnosis:  Active Problems:   Normal labor  Additional problems: SROM     Discharge diagnosis: Term Pregnancy Delivered                                                                                                Post partum procedures:PPH - treated w methergine  Augmentation: AROM and Pitocin  Complications: None  Hospital course:  Onset of Labor With Vaginal Delivery     27 y.o. yo G3P3003 at [redacted]w[redacted]d was admitted in Active Labor on 04/06/2018. Patient had an uncomplicated labor course as follows:  Membrane Rupture Time/Date: 8:00 PM ,04/05/2018   Intrapartum Procedures: Episiotomy: None [1]                                         Lacerations:  2nd degree [3]  Patient had a delivery of a Viable infant. 04/06/2018  Information for the patient's newborn:  Chandni, Gagan [[id number]]  Delivery Method: Vaginal, Spontaneous(Filed from Delivery Summary)    Pateint had an uncomplicated postpartum course.  She is ambulating, tolerating a regular diet, passing flatus, and urinating well. Patient is discharged home in stable condition on 04/07/18.   Physical exam  Vitals:   04/06/18 1530 04/06/18 1550 04/06/18 2121 04/07/18 0615  BP: 109/78 115/82 92/61 110/82  Pulse: 91 71 69 79  Resp:    18  Temp:   98.8 F (37.1 C)   TempSrc:   Oral   SpO2:      Weight:      Height:       General: alert and no distress Lochia: appropriate Uterine Fundus: firm  Labs: Lab Results  Component Value Date   WBC 12.8 (H) 04/06/2018   HGB 10.9 (L) 04/06/2018   HCT 34.1 (L) 04/06/2018   MCV 89.3 04/06/2018   PLT 155 04/06/2018   CMP Latest Ref Rng & Units 12/27/2015  Glucose 65 - 99 mg/dL 71  BUN 7 - 25 mg/dL 12   Creatinine 0.50 - 1.10 mg/dL 0.55  Sodium 135 - 146 mmol/L 140  Potassium 3.5 - 5.3 mmol/L 3.7  Chloride 98 - 110 mmol/L 104  CO2 20 - 31 mmol/L 27  Calcium 8.6 - 10.2 mg/dL 9.3    Discharge instruction: per After Visit Summary and "Baby and Me Booklet".  After visit meds:  Allergies as of 04/07/2018   No Known Allergies     Medication List    TAKE these medications   esomeprazole 40 MG capsule Commonly known as:  NEXIUM Take 1 capsule (40 mg total) by mouth daily at 12 noon.   ibuprofen 600 MG tablet Commonly known as:  ADVIL,MOTRIN Take 1 tablet (600 mg total) by mouth every 6 (six) hours  as needed.   levothyroxine 25 MCG tablet Commonly known as:  SYNTHROID, LEVOTHROID TAKE 1 TABLET BY MOUTH DAILY BEFORE BREAKFAST.   prenatal multivitamin Tabs tablet Take 1 tablet by mouth daily at 12 noon.       Diet: routine diet  Activity: Advance as tolerated. Pelvic rest for 6 weeks.   Outpatient follow up:6 weeks Follow up Appt:No future appointments. Follow up Visit:No follow-ups on file.  Postpartum contraception: Undecided  Newborn Data: Live born female  Birth Weight: 9 lb 0.4 oz (4094 g) APGAR: 82, 9  Newborn Delivery   Birth date/time:  04/[birth year]/2018 12:42:00 Delivery type:  Vaginal, Spontaneous     Baby Feeding: Breast Disposition:home with mother   04/07/2018 Janyth Contes, MD

## 2018-04-07 NOTE — Progress Notes (Signed)
MD aware of Hgb results and BP. Encouraged to drink more fluids.  Will continue to monitor.  Patient states she feels better.

## 2018-04-07 NOTE — Lactation Note (Signed)
This note was copied from a baby's chart. Lactation Consultation Note  Patient Name: Olivia Shepard [id number] Date: 04/07/2018 Reason for consult: Follow-up assessment;Term;Maternal endocrine disorder;Other (Comment)(PPH 1000 ml) Type of Endocrine Disorder?: Thyroid(Hypothyroidism)   Follow up with mom of 70 hour old infant at provider request. Infant with 12 + BF for 10-30 minutes, 4 voids and 3 stools in the last 24 hours (5 voids and 4 stools in life). LATCH scores 9-10. Infant weight 8 pounds 8.3 ounces with 6% weight loss since birth.   Infant was latched and feeding off and on while LC in the room. Good swallows heard, infant choked a few times on the breast with milk flow. Mom able to hand express colostrum easily and infant was spoon fed colostrum. Mom with a lot of uterine cramping with feeding, infant did need some stimulation to maintain suckling.   Discussed with mom that due to [place], facial bruising in the infant, tongue and lip restriction,  and weight loss that I would recommend pumping and/or hand expressing colostrum and feeding to infant after at least 4 feedings a day up to 8 feeds a day. Discussed importance of monitoring infant voids and stools as well as her weight to make sure she is getting enough.   Infant with thick labial frenulum that inserts at the bottom of the gum ridge. Upper lip tight with flanging. Infant with posterior lingual frenulum with some limited tongue elevation and extension. Mom notes that infant chomps and tongue thrusts on the breast a lot. Nipple is slightly compressed post BF and mom reports some minimal pain with feeding. Infant would not suckle on gloved finger. Enc mom to perform suck training before latch. Mom reports they took their 72 month old to Dr. Laurance Flatten in [facility] for posterior tongue revision at 44 days old. Mom was given handout on Tongue/lip restrictions and local providers.   Reviewed I/O, suck training, engorgement prevention/treatment, breast  milk handling and storage, and signs of dehydration in the infant. Mom has [place] at home for use. She is independent with hand expressing and latching infant to the breast.   Reviewed OP services, BF Support groups and [place] phone #. Mom agreeable to coming in for OP Lactation visit next week, request sent to [facility] [employer]. # 24 NS was given to mom with instructions for use if needed for nipple pain.   Mom reports all questions/concerns have been answered. Report to Laurena Spies, PNP.    Maternal Data Formula Feeding for Exclusion: No Has patient been taught Hand Expression?: Yes Does the patient have breastfeeding experience prior to this delivery?: Yes  Feeding Feeding Type: Breast Fed Length of feed: 20 min  LATCH Score Latch: Grasps breast easily, tongue down, lips flanged, rhythmical sucking.  Audible Swallowing: Spontaneous and intermittent  Type of Nipple: Everted at rest and after stimulation  Comfort (Breast/Nipple): Filling, red/small blisters or bruises, mild/mod discomfort  Hold (Positioning): No assistance needed to correctly position infant at breast.  LATCH Score: 9  Interventions Interventions: Breast feeding basics reviewed;Support pillows;Assisted with latch;Position options;Skin to skin;Breast compression;Hand express  Lactation Tools Discussed/Used WIC Program: No   Consult Status Consult Status: Complete Follow-up type: Call as needed    Donn Pierini 04/07/2018, 6:27 PM

## 2018-04-13 ENCOUNTER — Ambulatory Visit: Payer: Self-pay

## 2018-04-13 NOTE — Lactation Note (Addendum)
This note was copied from a baby's chart.  04/13/2018  Name: Olivia Shepard MRN: [id number] Date of Birth: 04/06/2020 Gestational Age: Gestational Age: [redacted]w[redacted]d Birth Weight: 144.4 oz Weight today:    8 pounds 10.7 ounces (3932 grams) with clean newborn diaper  Olivia Shepard has gained 68 grams since discharge with an average daily weight gain of 11 grams a day. She has gained 190 grams in the last 5 days since Ped visit with an average daily weight gain of 38 grams a day.   Olivia Shepard presents today with mom for feeding assessment.   Mom reports infant is not chomping as much was she was. Mom reports nipple pain with initial latch. Nipple is slightly compresses post BF. Mom is collecting milk with the [facility] when she is very full prior to latch 2 x a day. She is obtaining about 2 ounces which she is freezing. Discussed with mom it is ok to store as long as infant gaining. Reviewed that milk in the [facility] may be mainly foremilk. Mom reports she is trying to avoid an oversupply that she had with last child.   Infant with thick labial frenulum that inserts at the bottom of the gum ridge. Upper lip blanches with flanging. Lip flanges well on the breast. Infant with posterior lingual frenulum with some limited mid tongue elevation. Infant with good tongue extension and lateralization. Infant with strong suckle on gloved finger and good tongue cupping noted. Mom aware she can have evaluated by oral specialist and her son had a tongue tie revision. Mom has local provider list. Infant with Medicaid so was referred to Dr. Audie Pinto for follow up as needed.   Reviewed with mom how a Tongue and lip restriction can effect weight gain and milk supply long term and enc mom to monitor weight gain in the infant as well as output in the infant. Mom was encouraged to follow up weights with Support group as needed. Infant has follow up for the next few weeks. Support Group information given.   Infant with foul smelling umbilical  cord that is oozing blood tinged fluid in clothing and on mom's clothing. No red ring noted around umbilicus. Enc mom to call ped to let them know.   Infant fed well at the breast and transferred well. Mom denied pain past first few suckles. Nipple was slightly compressed post feeding.   Infant to follow up with Ped on Thursday for weight check or was told if weight today was good could come in for visit in a few weeks. Family connects to come out on June 16 or 17 th. Infant to follow up with Lactation as needed or if Tongue/Lip is revised.   Mom reports all questions/concerns have been answered. 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      General Information: Mother's reason for visit: feeding assessment, reassess tongue Consult: Initial Lactation consultant: Nonah Mattes RN,IBCLC Breastfeeding experience: feeding much better, less chomping at the breast, milk is in  Maternal medical conditions: Thyroid, Post-partum hemorrhage, Other(Hypothyroidism, PP anxiety, PP hemmorhage 1000 ml) Maternal medications: Pre-natal vitamin, Iron, Other(Levothyroxine, Sertraline, Vitamin C, Foloc Acid)  Breastfeeding History: Frequency of breast feeding: every 2.5-3 hours Duration of feeding: 20-30 minutes  Supplementation:                 Pump type: Other([place]) Pump frequency: BID Pump volume: 60 ml  Infant Output Assessment: Voids per 24 hours: 6+ Urine color: Clear yellow Stools per 24 hours: 3+ Stool color: Yellow  Breast  Assessment: Breast: Filling Nipple: Erect Pain level: 2(withi initial latch, improves with feeding) Pain interventions: Bra, Expressed breast milk  Feeding Assessment: Infant oral assessment: Variance Infant oral assessment comment: Infant with thick labial frenulum that inserts at the bottom of the gum ridge. Upper lip blanches with flanging. Lip flanges well on the breast. Infant with posterior lingual frenulum with some limited mid tongue elevation. Infant with good tongue  extension and lateralization. Infant with strong suckle on gloved finger and good tongue cupping noted. Positioning: Cradle(right breast) Latch: 2 - Grasps breast easily, tongue down, lips flanged, rhythmical sucking. Audible swallowing: 2 - Spontaneous and intermittent Type of nipple: 2 - Everted at rest and after stimulation Comfort: 1 - Filling, red/small blisters or bruises, mild/mod discomfort Hold: 2 - No assistance needed to correctly position infant at breast LATCH score: 9 Latch assessment: Deep Lips flanged: Yes Suck assessment: Displays both   Pre-feed weight: 3932 grams Post feed weight: 4022 grams Amount transferred: 90 ml Amount supplemented: 0  Additional Feeding Assessment: Infant oral assessment: Variance Infant oral assessment comment: see above Positioning: Cradle(left breast) Latch: 2 - Grasps breast easily, tongue down, lips flanged, rhythmical sucking. Audible swallowing: 2 - Spontaneous and intermittent Type of nipple: 2 - Everted at rest and after stimulation Comfort: 1 - Filling, red/small blisters or bruises, mild/mod discomfort Hold: 2 - No assistance needed to correctly position infant at breast LATCH score: 9 Latch assessment: Deep Lips flanged: Yes Suck assessment: Displays both   Pre-feed weight: 4022 grams Post feed weight: 4052 grams Amount transferred: 30 ml Amount supplemented: 0  Totals: Total amount transferred: 120 ml Total supplement given: 0 Total amount pumped post feed: 0   Plan: 1. Feed infant at the breast with feeding cues for as long as she wants 2. Keep infant awake at the breast as needed 3. Massage/compress breast with feeding 4. Empty first breast before offering second breast 5. Olivia Shepard needs 73-98 ml (2.5-3.25 ounces) for 8 feedings a day or 585-780 ml (20-26 ounces) a day 6. Consider having infant evaluated by an oral specialist  7. Keep up the good work 8. Call with any questions/concerns as needed [phone]  [phone] 9. Thank you for allowing me to assist you today 10. Follow up with Lactation as needed or 1-2 days after a tongue/lip revision is completed  Donn Pierini RN, IBCLC                                                      Debby Freiberg Quanta Robertshaw 04/13/2018, 8:43 AM

## 2018-08-23 ENCOUNTER — Other Ambulatory Visit: Payer: Self-pay

## 2018-08-23 DIAGNOSIS — K219 Gastro-esophageal reflux disease without esophagitis: Secondary | ICD-10-CM

## 2018-08-23 MED ORDER — ESOMEPRAZOLE MAGNESIUM 20 MG PO CPDR: 20.0000 mg | DELAYED_RELEASE_CAPSULE | Freq: Every day | ORAL | 0 refills | Status: DC

## 2018-08-23 NOTE — Telephone Encounter (Signed)
Refilled Nexium for 30 days---would discuss trial of H2 blocker with patient at upcoming appointment.

## 2018-08-27 ENCOUNTER — Telehealth: Payer: Self-pay | Admitting: Family Medicine

## 2018-08-27 ENCOUNTER — Other Ambulatory Visit: Payer: Self-pay

## 2018-08-27 ENCOUNTER — Ambulatory Visit: Payer: BLUE CROSS/BLUE SHIELD | Admitting: Family Medicine

## 2018-08-27 ENCOUNTER — Ambulatory Visit
Admission: RE | Admit: 2018-08-27 | Discharge: 2018-08-27 | Disposition: A | Discharge status: Home / Self Care | Payer: BLUE CROSS/BLUE SHIELD | Source: Ambulatory Visit | Attending: Family Medicine | Admitting: Family Medicine

## 2018-08-27 ENCOUNTER — Encounter: Payer: Self-pay | Admitting: Family Medicine

## 2018-08-27 VITALS — BP 115/65 | HR 60 | Temp 97.6°F | Wt 159.0 lb

## 2018-08-27 DIAGNOSIS — B070 Plantar wart: Secondary | ICD-10-CM

## 2018-08-27 DIAGNOSIS — N912 Amenorrhea, unspecified: Secondary | ICD-10-CM | POA: Diagnosis not present

## 2018-08-27 DIAGNOSIS — Z23 Encounter for immunization: Secondary | ICD-10-CM

## 2018-08-27 DIAGNOSIS — M25532 Pain in left wrist: Secondary | ICD-10-CM

## 2018-08-27 DIAGNOSIS — E039 Hypothyroidism, unspecified: Secondary | ICD-10-CM | POA: Diagnosis not present

## 2018-08-27 DIAGNOSIS — R42 Dizziness and giddiness: Secondary | ICD-10-CM | POA: Insufficient documentation

## 2018-08-27 DIAGNOSIS — M25539 Pain in unspecified wrist: Secondary | ICD-10-CM | POA: Insufficient documentation

## 2018-08-27 LAB — POCT URINE PREGNANCY: Preg Test, Ur: NEGATIVE

## 2018-08-27 NOTE — Assessment & Plan Note (Addendum)
She will benefit from excision at this point. I recommended podiatry eval. She agreed with the plan. Referral to podiatrist completed.

## 2018-08-27 NOTE — Patient Instructions (Signed)
Wrist and Forearm Exercises Ask your health care provider which exercises are safe for you. Do exercises exactly as told by your health care provider and adjust them as directed. It is normal to feel mild stretching, pulling, tightness, or discomfort as you do these exercises, but you should stop right away if you feel sudden pain or your pain gets worse. Do not begin these exercises until told by your health care provider. RANGE OF MOTION EXERCISES These exercises warm up your muscles and joints and improve the movement and flexibility of your injured wrist and forearm. These exercises also help to relieve pain, numbness, and tingling. These exercises are done using the muscles in your injured wrist and forearm. Exercise A: Wrist Flexion, Active 1. With your fingers relaxed, bend your wrist forward as far as you can. 2. Hold this position for __________ seconds. Repeat __________ times. Complete this exercise __________ times a day. Exercise B: Wrist Extension, Active 1. With your fingers relaxed, bend your wrist backward as far as you can. 2. Hold this position for __________ seconds. Repeat __________ times. Complete this exercise __________ times a day. Exercise C: Supination, Active  1. Stand or sit with your arms at your sides. 2. Bend your left / right elbow to an "L" shape (90 degrees). 3. Turn your palm upward until you feel a gentle stretch on the inside of your forearm. 4. Hold this position for __________ seconds. 5. Slowly return your palm to the starting position. Repeat __________ times. Complete this exercise __________ times a day. Exercise D: Pronation, Active  1. Stand or sit with your arms at your sides. 2. Bend your left / right elbow to an "L" shape (90 degrees). 3. Turn your palm downward until you feel a gentle stretch on the top of your forearm. 4. Hold this position for __________ seconds. 5. Slowly return your palm to the starting position. Repeat __________  times. Complete this exercise __________ times a day. STRETCHING EXERCISES These exercises warm up your muscles and joints and improve the movement and flexibility of your injured wrist and forearm. These exercises also help to relieve pain, numbness, and tingling. These exercises are done using your healthy wrist and forearm to help stretch the muscles in your injured wrist and forearm. Exercise E: Wrist Flexion, Passive  1. Extend your left / right arm in front of you, relax your wrist, and point your fingers downward. 2. Gently push on the back of your hand. Stop when you feel a gentle stretch on the top of your forearm. 3. Hold this position for __________ seconds. Repeat __________ times. Complete this exercise __________ times a day. Exercise F: Wrist Extension, Passive  1. Extend your left / right arm in front of you and turn your palm upward. 2. Gently pull your palm and fingertips back so your fingers point downward. You should feel a gentle stretch on the palm-side of your forearm. 3. Hold this position for __________ seconds. Repeat __________ times. Complete this exercise __________ times a day. Exercise G: Forearm Rotation, Supination, Passive 1. Sit with your left / right elbow bent to an "L" shape (90 degrees) with your forearm resting on a table. 2. Keeping your upper body and shoulder still, use your other hand to rotate your forearm palm-up until you feel a gentle to moderate stretch. 3. Hold this position for __________ seconds. 4. Slowly release the stretch and return to the starting position. Repeat __________ times. Complete this exercise __________ times a day. Exercise H:   Forearm Rotation, Pronation, Passive 1. Sit with your left / right elbow bent to an "L" shape (90 degrees) with your forearm resting on a table. 2. Keeping your upper body and shoulder still, use your other hand to rotate your forearm palm-down until you feel a gentle to moderate stretch. 3. Hold this  position for __________ seconds. 4. Slowly release the stretch and return to the starting position. Repeat __________ times. Complete this exercise __________ times a day. STRENGTHENING EXERCISES These exercises build strength and endurance in your wrist and forearm. Endurance is the ability to use your muscles for a long time, even after they get tired. Exercise I: Wrist Flexors  1. Sit with your left / right forearm supported on a table and your hand resting palm-up over the edge of the table. Your elbow should be bent to an "L" shape (about 90 degrees) and be below the level of your shoulder. 2. Hold a __________ weight in your left / right hand. Or, hold a rubber exercise band or tube in both hands, keeping your hands at the same level and hip distance apart. There should be a slight tension in the exercise band or tube. 3. Slowly curl your hand up toward your forearm. 4. Hold this position for __________ seconds. 5. Slowly lower your hand back to the starting position. Repeat __________ times. Complete this exercise __________ times a day. Exercise J: Wrist Extensors  1. Sit with your left / right forearm supported on a table and your hand resting palm-down over the edge of the table. Your elbow should be bent to an "L" shape (about 90 degrees) and be below the level of your shoulder. 2. Hold a __________ weight in your left / right hand. Or, hold a rubber exercise band or tube in both hands, keeping your hands at the same level and hip distance apart. There should be a slight tension in the exercise band or tube. 3. Slowly curl your hand up toward your forearm. 4. Hold this position for __________ seconds. 5. Slowly lower your hand back to the starting position. Repeat __________ times. Complete this exercise __________ times a day. Exercise K: Forearm Rotation, Supination  1. Sit with your left / right forearm supported on a table and your hand resting palm-down. Your elbow should be at  your side, bent to an "L" shape (about 90 degrees), and below the level of your shoulder. Keep your wrist stable and in a neutral position throughout the exercise. 2. Gently hold a lightweight hammer with your left / right hand. 3. Without moving your elbow or wrist, slowly rotate your palm upward to a thumbs-up position. 4. Hold this position for __________ seconds. 5. Slowly return your forearm to the starting position. Repeat __________ times. Complete this exercise __________ times a day. Exercise L: Forearm Rotation, Pronation  1. Sit with your left / right forearm supported on a table and your hand resting palm-up. Your elbow should be at your side, bent to an "L" shape (about 90 degrees), and below the level of your shoulder. Keep your wrist stable. Do not allow it to move backward or forward during the exercise. 2. Gently hold a lightweight hammer with your left / right hand. 3. Without moving your elbow or wrist, slowly rotate your palm and hand upward to a thumbs-up position. 4. Hold this position for __________ seconds. 5. Slowly return your forearm to the starting position. Repeat __________ times. Complete this exercise __________ times a day. Exercise M: Grip   Strengthening  1. Hold one of these items in your left / right hand: play dough, therapy putty, a dense sponge, a stress ball, or a large, rolled sock. 2. Squeeze as hard as you can without increasing pain. 3. Hold this position for __________ seconds. 4. Slowly release your grip. Repeat __________ times. Complete this exercise __________ times a day. This information is not intended to replace advice given to you by your health care provider. Make sure you discuss any questions you have with your health care provider. Document Released: 09/10/2005 Document Revised: 07/21/2016 Document Reviewed: 07/22/2015 Elsevier Interactive Patient Education  2018 [employer]  

## 2018-08-27 NOTE — Telephone Encounter (Signed)
HIPAA compliant call back message left.   Please note:  Whenever she calls back, let her know that her wrist xray is normal.  I have referred her to PT.  May use Ibuprofen as needed for pain.  Knot in her hand likely ganglion cyst.  Consider hand specialist referral in the future if it does not resolve or if worsening. I will defer referral to her PCP.

## 2018-08-27 NOTE — Assessment & Plan Note (Signed)
No neurologic deficit. May be due to over use. Xray completed and was normal. PT recommended. Use Ibuprofen prn pain.  Bump on the back on her wrist likely ganglion cyst. Radiologist did not comment on it on the xray report. Consider hand specialist referral in the future if it worsens.

## 2018-08-27 NOTE — Assessment & Plan Note (Signed)
Compliant with med. TSH checked. F?U with PCP soon.

## 2018-08-27 NOTE — Assessment & Plan Note (Signed)
Chronic and recurrent. Currently asymptomatic. BP low normal. Hx of anemia in the past. CBC rechecked. I will contact her with the result. F/U with Cards as planned.

## 2018-08-27 NOTE — Telephone Encounter (Signed)
Patient returned call. All information given. Patient asked also that referral to podiatry be placed for her wart.  Call back is [phone] if needed.  Danley Danker, RN [facility] RN)

## 2018-08-27 NOTE — Progress Notes (Signed)
tsh Subjective:     Patient ID: Olivia Shepard, female   DOB: 07/18/1991, 27 y.o.   MRN: [id number]  Wrist Pain   The pain is present in the left wrist. This is a chronic problem. Episode onset: more than 3 years ago. There has been no history of extremity trauma. The problem occurs constantly. The problem has been gradually worsening. The quality of the pain is described as sharp. The pain is at a severity of 3/10 (Severity can go up to 5/10 when it is really bad). The pain is mild. Associated symptoms include joint swelling, a limited range of motion, numbness and tingling. Pertinent negatives include no joint locking or stiffness. Associated symptoms comments: Intermittent numbing of the left thumb and index finger. A hard bump or knot on her wrist started years ago which has gotten bigger and is painful with maneuver.. The symptoms are aggravated by activity. Treatments tried: Ibuprofen, diclofenac. The treatment provided moderate relief. Family history does not include gout or rheumatoid arthritis. There is no history of osteoarthritis or rheumatoid arthritis.  Dizziness:C/O occasional dizziness on and off since 2013. Marland Kitchen Feels fine now.She was evaluated by a Cardiologist few months ago with heart monitor. She was not contacted back with the result. No other neuro symptoms. Wart: C/O chronic wart on her right 5th toe which has been there for about 2 yrs or more. She got it frozen last year with minimal improvement. She has been using hydrogen peroxide solution on it. She denies any pain. Hypothyroidism: Compliant with meds. Here for f/u.  Current Outpatient Medications on File Prior to Visit  Medication Sig Dispense Refill  . esomeprazole (NEXIUM) 20 MG capsule Take 1 capsule (20 mg total) by mouth daily at 12 noon. 30 capsule 0  . ibuprofen (ADVIL,MOTRIN) 600 MG tablet Take 1 tablet (600 mg total) by mouth every 6 (six) hours as needed. 45 tablet 1  . levothyroxine (SYNTHROID, LEVOTHROID) 25 MCG  tablet TAKE 1 TABLET BY MOUTH DAILY BEFORE BREAKFAST. 30 tablet 0  . Prenatal Vit-Fe Fumarate-FA (PRENATAL MULTIVITAMIN) TABS tablet Take 1 tablet by mouth daily at 12 noon. 100 tablet 3  . sertraline (ZOLOFT) 50 MG tablet Take 1 tablet (50 mg total) by mouth daily. For first 7-10days, take 1/2 tablet daily, then increase to 50mg  po qday 30 tablet 3   No current facility-administered medications on file prior to visit.    Past Medical History:  Diagnosis Date  . Anemia   . Anxiety   . GERD (gastroesophageal reflux disease)   . Hypothyroidism   . Thyroid disease     Vitals:   08/27/18 0831  BP: 115/65  Pulse: 60  Temp: 97.6 F (36.4 C)  TempSrc: Oral  SpO2: 96%  Weight: 159 lb (72.1 kg)     Review of Systems  Respiratory: Negative.   Cardiovascular: Negative.   Gastrointestinal: Negative.   Musculoskeletal: Negative for stiffness.       Left wrist pain  Neurological: Positive for tingling and numbness.  All other systems reviewed and are negative.      Objective:   Physical Exam  Constitutional: She is oriented to person, place, and time. She appears well-developed. No distress.  Neck: No thyromegaly present.  Cardiovascular: Normal rate, regular rhythm and normal heart sounds.  No murmur heard. Pulmonary/Chest: Effort normal and breath sounds normal. No stridor. No respiratory distress. She has no wheezes.  Abdominal: Soft. Bowel sounds are normal. She exhibits no distension and no mass. There is no  tenderness.  Musculoskeletal: She exhibits no edema.       Left wrist: She exhibits tenderness. She exhibits normal range of motion, no swelling and no crepitus.  An area of white thick lesion on a yellowish tissue on the pulp of her right small toe. No erythema.  Round, hard nodule on the dorsum of her left wrist directly overlying her radiocarpal joint.  Neurological: She is alert and oriented to person, place, and time. She has normal strength. No sensory deficit. She  displays a negative Romberg sign. Gait normal.  Nursing note and vitals reviewed.        Assessment:     Left wrist pain Dizziness Wart Hypothyroidism    Plan:     Check problem list.

## 2018-08-28 LAB — BASIC METABOLIC PANEL
BUN / CREAT RATIO: 23 (ref 9–23)
BUN: 17 mg/dL (ref 6–20)
CO2: 25 mmol/L (ref 20–29)
CREATININE: 0.75 mg/dL (ref 0.57–1.00)
Calcium: 10.1 mg/dL (ref 8.7–10.2)
Chloride: 104 mmol/L (ref 96–106)
GFR calc non Af Amer: 109 mL/min/{1.73_m2} (ref >59)
GFR, EST AFRICAN AMERICAN: 125 mL/min/{1.73_m2} (ref >59)
Glucose: 76 mg/dL (ref 65–99)
Potassium: 4.3 mmol/L (ref 3.5–5.2)
Sodium: 144 mmol/L (ref 134–144)

## 2018-08-28 LAB — CBC
HEMATOCRIT: 39 % (ref 34.0–46.6)
HEMOGLOBIN: 13.1 g/dL (ref 11.1–15.9)
MCH: 28.7 pg (ref 26.6–33.0)
MCHC: 33.6 g/dL (ref 31.5–35.7)
MCV: 85 fL (ref 79–97)
Platelets: 222 10*3/uL (ref 150–450)
RBC: 4.57 x10E6/uL (ref 3.77–5.28)
RDW: 12.7 % (ref 12.3–15.4)
WBC: 4.8 10*3/uL (ref 3.4–10.8)

## 2018-08-28 LAB — TSH: TSH: 2.36 u[IU]/mL (ref 0.450–4.500)

## 2018-08-30 ENCOUNTER — Encounter: Payer: Self-pay | Admitting: Podiatry

## 2018-08-30 ENCOUNTER — Ambulatory Visit: Payer: BLUE CROSS/BLUE SHIELD | Admitting: Podiatry

## 2018-08-30 ENCOUNTER — Telehealth: Payer: Self-pay

## 2018-08-30 DIAGNOSIS — B070 Plantar wart: Secondary | ICD-10-CM

## 2018-08-30 NOTE — Telephone Encounter (Signed)
-----   Message from Kinnie Feil, MD sent at 08/30/2018  8:00 AM EDT ----- Please advise patient that her labs are normal. No anemia. TSH is fine Kidney test is fine. F/U with PCP and Cardiology as recommended for dizziness evaluation.

## 2018-08-30 NOTE — Progress Notes (Signed)
   Subjective:    Patient ID: Olivia Shepard, female    DOB: [birth year], 27 y.o.   MRN: [id number]  HPI    Review of Systems  All other systems reviewed and are negative.      Objective:   Physical Exam        Assessment & Plan:

## 2018-08-30 NOTE — Telephone Encounter (Signed)
Pt informed of normal lab results

## 2018-08-30 NOTE — Telephone Encounter (Signed)
LVM for patient to return my call. Please inform of normal lab results when she calls back.

## 2018-09-01 NOTE — Progress Notes (Signed)
Subjective:   Patient ID: Olivia Shepard, female   DOB: 27 y.o.   MRN: [id number]   HPI Patient presents with a painful lesion underneath the right fifth digit that is been present for around a year patient is tried over-the-counter medicine which seems to help temporarily but it seems to recur quite frequently.  Patient has had a baby in the last 4 months and patient does not smoke and likes to be active and is currently breast-feeding   Review of Systems  All other systems reviewed and are negative.       Objective:  Physical Exam  Constitutional: She appears well-developed and well-nourished.  Cardiovascular: Intact distal pulses.  Pulmonary/Chest: Effort normal.  Musculoskeletal: Normal range of motion.  Neurological: She is alert.  Skin: Skin is warm.  Nursing note and vitals reviewed.   Neurovascular status was found to be intact muscle strength was adequate range of motion within normal limits with patient noted to have lesion plantar aspect right fifth digit which measures approximately 5 x 5 mm and upon debridement shows pinpoint bleeding.  Patient is found to have good digital perfusion and is well oriented x3     Assessment:  Probability for verruca plantaris plantar aspect right fifth toe with small possibility that this could be due to the structural position of the fifth digit     Plan:  H&P condition reviewed and explained and education rendered to patient.  Today sterile debridement accomplished and I applied medication to create immune response within the lesion and applied sterile dressing.  I gave instructions on recurrence and the fact that we may need to treated again and it may require other treatment at home.  Patient will be seen back as needed

## 2018-09-10 ENCOUNTER — Other Ambulatory Visit: Payer: Self-pay

## 2018-09-10 DIAGNOSIS — K219 Gastro-esophageal reflux disease without esophagitis: Secondary | ICD-10-CM

## 2018-09-10 MED ORDER — ESOMEPRAZOLE MAGNESIUM 20 MG PO CPDR: 20.0000 mg | DELAYED_RELEASE_CAPSULE | Freq: Every day | ORAL | 2 refills | Status: DC

## 2018-09-16 ENCOUNTER — Telehealth: Payer: Self-pay | Admitting: *Deleted

## 2018-09-16 DIAGNOSIS — E039 Hypothyroidism, unspecified: Secondary | ICD-10-CM

## 2018-09-16 MED ORDER — LEVOTHYROXINE SODIUM 25 MCG PO TABS
25.0000 ug | ORAL_TABLET | Freq: Every day | ORAL | 3 refills | Status: DC
Start: 2018-09-16 — End: 2019-09-09

## 2018-09-16 NOTE — Telephone Encounter (Signed)
When pt was here for her appt on 08/27/18, she forgot to ask for a refill on thyroid meds. She knows we drew blood so she is not sure if the dosage will need to change. Stephine Langbehn, Salome Spotted, CMA

## 2018-09-16 NOTE — Telephone Encounter (Signed)
Called pharmacy and was informed that patient has been getting Levothyroxine 25 MCG every month. The only time she has missed is September.  Ozella Almond, [place]

## 2018-09-16 NOTE — Telephone Encounter (Signed)
Can you call pharmacy to confirm she has been receiving refills of this medication---she is on a low dose and TSH has been normal. Last fill from our practice was 2018 per my review.

## 2018-09-16 NOTE — Telephone Encounter (Signed)
Refill for 1 year sent to pharmacy. TSH earlier this year- WNL.  Dorris Singh, MD  Family Medicine Teaching Service

## 2018-09-27 ENCOUNTER — Encounter: Payer: Self-pay | Admitting: Podiatry

## 2018-10-20 ENCOUNTER — Ambulatory Visit: Payer: BLUE CROSS/BLUE SHIELD | Admitting: Podiatry

## 2018-10-20 ENCOUNTER — Encounter: Payer: Self-pay | Admitting: Podiatry

## 2018-10-20 DIAGNOSIS — B070 Plantar wart: Secondary | ICD-10-CM | POA: Diagnosis not present

## 2018-10-20 NOTE — Progress Notes (Signed)
Subjective:   Patient ID: Olivia Shepard, female   DOB: 27 y.o.   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

## 2018-11-24 ENCOUNTER — Telehealth: Payer: Self-pay

## 2018-11-24 ENCOUNTER — Encounter: Payer: Self-pay | Admitting: Family Medicine

## 2018-11-24 ENCOUNTER — Telehealth: Payer: Self-pay | Admitting: Family Medicine

## 2018-11-24 DIAGNOSIS — F418 Other specified anxiety disorders: Secondary | ICD-10-CM

## 2018-11-24 DIAGNOSIS — O99345 Other mental disorders complicating the puerperium: Secondary | ICD-10-CM

## 2018-11-24 MED ORDER — SERTRALINE HCL 50 MG PO TABS: 50.0000 mg | ORAL_TABLET | Freq: Every day | ORAL | 3 refills | Status: DC

## 2018-11-24 NOTE — Telephone Encounter (Signed)
Patient seen by Dr. Gwendlyn Shepard earlier this year--- doing well. Please let patient know medication has been refilled for 1 year.   MyChart message:   ---- Message from Olivia Shepard, [place] sent at 11/24/2018 11:32 AM EST -----      ----- Message from Olivia Shepard to Olivia Feil, MD sent at 11/24/2018 11:12 AM -----   Hi, my sertraline prescription is due to be refilled in about 2 weeks. Is that something you can refill? 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 I took it for about 1 year after my son was born last time and my most current baby is now 7 months so I anticipate needing it at least another 3-4 months.

## 2018-11-24 NOTE — Telephone Encounter (Signed)
LVM for patient concerning med refill.  Olivia Shepard, [place]

## 2019-05-30 ENCOUNTER — Encounter: Payer: Self-pay | Admitting: Family Medicine

## 2019-06-02 ENCOUNTER — Other Ambulatory Visit: Payer: Self-pay | Admitting: Family Medicine

## 2019-06-02 ENCOUNTER — Other Ambulatory Visit: Payer: BLUE CROSS/BLUE SHIELD

## 2019-06-02 ENCOUNTER — Telehealth: Payer: Self-pay | Admitting: Family Medicine

## 2019-06-02 DIAGNOSIS — Z111 Encounter for screening for respiratory tuberculosis: Secondary | ICD-10-CM

## 2019-06-02 NOTE — Progress Notes (Signed)
Quantiferon ordered. Paperwork specified Tb skin test, patient will call to ensure this is acceptable.

## 2019-06-02 NOTE — Telephone Encounter (Signed)
Given the option of a TB skin test (2 visits) or a blood test which requires only one visit; patient choose blood testing.  Appointment is today 06/02/2019 for a Lab visit at 1330 for Quantiferon Gold Plus testing.    Ozella Almond, [place]

## 2019-06-02 NOTE — Telephone Encounter (Signed)
Given to admin to mail to patients home. Christen Bame, CMA

## 2019-06-02 NOTE — Telephone Encounter (Signed)
Called patient about Tb questions. 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   Nursing---can you please call patient to schedule this test at her convenience?   Thank you, Dorris Singh, MD  [facility] Medicine Teaching Service

## 2019-06-02 NOTE — Telephone Encounter (Signed)
Patient sent Tb skin test from 1 year ago via [place]. Form completed, signed, in Nurse box. Please mail to patient's home address. Also uploaded to Media tab.   Thank you, Dorris Singh, MD  [facility] Medicine Teaching Service

## 2019-09-06 ENCOUNTER — Other Ambulatory Visit: Payer: Self-pay

## 2019-09-06 DIAGNOSIS — Z20822 Contact with and (suspected) exposure to covid-19: Secondary | ICD-10-CM

## 2019-09-08 LAB — NOVEL CORONAVIRUS, NAA: SARS-CoV-2, NAA: NOT DETECTED

## 2019-09-09 ENCOUNTER — Other Ambulatory Visit: Payer: Self-pay

## 2019-09-09 DIAGNOSIS — E039 Hypothyroidism, unspecified: Secondary | ICD-10-CM

## 2019-09-09 MED ORDER — LEVOTHYROXINE SODIUM 25 MCG PO TABS: 25.0000 ug | ORAL_TABLET | Freq: Every day | ORAL | 3 refills | Status: DC

## 2019-10-10 DIAGNOSIS — O3680X0 Pregnancy with inconclusive fetal viability, not applicable or unspecified: Secondary | ICD-10-CM | POA: Diagnosis not present

## 2019-10-11 DIAGNOSIS — Z3A09 9 weeks gestation of pregnancy: Secondary | ICD-10-CM | POA: Diagnosis not present

## 2019-10-11 DIAGNOSIS — O09291 Supervision of pregnancy with other poor reproductive or obstetric history, first trimester: Secondary | ICD-10-CM | POA: Diagnosis not present

## 2019-10-12 DIAGNOSIS — O99345 Other mental disorders complicating the puerperium: Secondary | ICD-10-CM | POA: Insufficient documentation

## 2019-10-12 DIAGNOSIS — O09299 Supervision of pregnancy with other poor reproductive or obstetric history, unspecified trimester: Secondary | ICD-10-CM | POA: Insufficient documentation

## 2019-10-12 DIAGNOSIS — F418 Other specified anxiety disorders: Secondary | ICD-10-CM | POA: Insufficient documentation

## 2019-11-08 DIAGNOSIS — J029 Acute pharyngitis, unspecified: Secondary | ICD-10-CM | POA: Diagnosis not present

## 2019-11-09 ENCOUNTER — Telehealth: Payer: BLUE CROSS/BLUE SHIELD | Admitting: Family Medicine

## 2019-11-09 ENCOUNTER — Other Ambulatory Visit: Payer: Self-pay

## 2019-11-24 DIAGNOSIS — O2342 Unspecified infection of urinary tract in pregnancy, second trimester: Secondary | ICD-10-CM | POA: Diagnosis not present

## 2019-11-24 DIAGNOSIS — Z3480 Encounter for supervision of other normal pregnancy, unspecified trimester: Secondary | ICD-10-CM | POA: Diagnosis not present

## 2019-12-19 DIAGNOSIS — Z3689 Encounter for other specified antenatal screening: Secondary | ICD-10-CM | POA: Diagnosis not present

## 2019-12-19 DIAGNOSIS — O2200 Varicose veins of lower extremity in pregnancy, unspecified trimester: Secondary | ICD-10-CM | POA: Insufficient documentation

## 2020-01-04 DIAGNOSIS — Z3A21 21 weeks gestation of pregnancy: Secondary | ICD-10-CM | POA: Diagnosis not present

## 2020-01-04 DIAGNOSIS — O4442 Low lying placenta NOS or without hemorrhage, second trimester: Secondary | ICD-10-CM | POA: Diagnosis not present

## 2020-01-04 DIAGNOSIS — O4392 Unspecified placental disorder, second trimester: Secondary | ICD-10-CM | POA: Diagnosis not present

## 2020-02-15 ENCOUNTER — Other Ambulatory Visit: Payer: Self-pay

## 2020-02-15 ENCOUNTER — Ambulatory Visit (INDEPENDENT_AMBULATORY_CARE_PROVIDER_SITE_OTHER): Payer: Medicaid Other | Admitting: Family Medicine

## 2020-02-15 VITALS — BP 98/60 | HR 82 | Wt 162.8 lb

## 2020-02-15 DIAGNOSIS — B353 Tinea pedis: Secondary | ICD-10-CM | POA: Diagnosis present

## 2020-02-15 MED ORDER — BUTENAFINE HCL 1 % EX CREA: 30.0000 g | TOPICAL_CREAM | Freq: Two times a day (BID) | CUTANEOUS | 0 refills | Status: DC

## 2020-02-15 MED ORDER — BUTENAFINE HCL 1 % EX CREA: 1.0000 "application " | TOPICAL_CREAM | Freq: Two times a day (BID) | CUTANEOUS | 0 refills | Status: DC

## 2020-02-15 NOTE — Progress Notes (Signed)
    SUBJECTIVE:   CHIEF COMPLAINT / HPI:   Blisters on feet Occurring since 2014, very pruritic, occur between toes and in arch of each foot, now involving the nails, which look more brittle.  Has tried topical Lamisil without relief.  Has taken oral antifungals in the past when rash has worsened, but since she is currently pregnant, oral antifungals are contraindicated.  She has also been wearing support hose to help reduce her varicose veins, which is exacerbating the fungal infection on her feet due to the increased humid environment  PERTINENT  PMH / PSH: Tinea pedis, hypothyroidism, current pregnancy  OBJECTIVE:   BP 98/60   Pulse 82   Wt 162 lb 12.8 oz (73.8 kg)   SpO2 98%   BMI 25.50 kg/m   General: well appearing, appears stated age Skin: Scattered, erythematous, fine papules on sole of foot around the arch and between toes bilaterally with signs of excoriation.  No signs of overlying infection.    ASSESSMENT/PLAN:   Tinea pedis of both feet Possibly exacerbated by relative immunocompromise of pregnancy as well as support stockings.  Will treat with butenafine since patient has already tried Lamisil without improvement, and -azoles are contraindicated in pregnancy.  No oral agent that I can find is indicated during pregnancy either, so patient's fungal infection of the nail will likely need to be treated after delivery.  Patient will call if she has no improvement after 2 weeks of using this medication.     Kathrene Alu, MD [place]

## 2020-02-15 NOTE — Patient Instructions (Addendum)
It was nice meeting you today Ms. Karow!  I am sending butenafine, which is in the same family as Lamisil, for your rash.  Please apply this twice daily for a week, although you may need to apply for a longer stretch of time depending on when your symptoms resolve.  If after 2 weeks you do not have any improvement, please call us, and I will send in a different medication.  You do not necessarily need to return unless you have worsening symptoms.    If you have any questions or concerns, please feel free to call the clinic.   Be well,  Dr. Shan Levans

## 2020-02-15 NOTE — Assessment & Plan Note (Addendum)
Possibly exacerbated by relative immunocompromise of pregnancy as well as support stockings.  Will treat with butenafine since patient has already tried Lamisil without improvement, and -azoles are contraindicated in pregnancy.  No oral agent that I can find is indicated during pregnancy either, so patient's fungal infection of the nail will likely need to be treated after delivery.  Patient will call if she has no improvement after 2 weeks of using this medication.

## 2020-02-17 DIAGNOSIS — O99283 Endocrine, nutritional and metabolic diseases complicating pregnancy, third trimester: Secondary | ICD-10-CM | POA: Diagnosis not present

## 2020-02-17 DIAGNOSIS — Z3480 Encounter for supervision of other normal pregnancy, unspecified trimester: Secondary | ICD-10-CM | POA: Diagnosis not present

## 2020-02-17 DIAGNOSIS — E039 Hypothyroidism, unspecified: Secondary | ICD-10-CM | POA: Diagnosis not present

## 2020-04-16 DIAGNOSIS — Z3685 Encounter for antenatal screening for Streptococcus B: Secondary | ICD-10-CM | POA: Diagnosis not present

## 2020-04-17 DIAGNOSIS — Z3689 Encounter for other specified antenatal screening: Secondary | ICD-10-CM | POA: Diagnosis not present

## 2020-04-30 DIAGNOSIS — Z3480 Encounter for supervision of other normal pregnancy, unspecified trimester: Secondary | ICD-10-CM | POA: Diagnosis not present

## 2020-04-30 DIAGNOSIS — Z23 Encounter for immunization: Secondary | ICD-10-CM | POA: Diagnosis not present

## 2020-05-05 DIAGNOSIS — E039 Hypothyroidism, unspecified: Secondary | ICD-10-CM | POA: Diagnosis not present

## 2020-05-05 DIAGNOSIS — Z8659 Personal history of other mental and behavioral disorders: Secondary | ICD-10-CM | POA: Diagnosis not present

## 2020-05-05 DIAGNOSIS — O2203 Varicose veins of lower extremity in pregnancy, third trimester: Secondary | ICD-10-CM | POA: Diagnosis not present

## 2020-05-05 DIAGNOSIS — Z3A39 39 weeks gestation of pregnancy: Secondary | ICD-10-CM | POA: Diagnosis not present

## 2020-05-05 DIAGNOSIS — O99284 Endocrine, nutritional and metabolic diseases complicating childbirth: Secondary | ICD-10-CM | POA: Diagnosis not present

## 2020-05-05 DIAGNOSIS — Z8759 Personal history of other complications of pregnancy, childbirth and the puerperium: Secondary | ICD-10-CM | POA: Diagnosis not present

## 2020-07-10 ENCOUNTER — Other Ambulatory Visit: Payer: Self-pay

## 2020-07-10 ENCOUNTER — Ambulatory Visit: Payer: Medicaid Other | Admitting: Family Medicine

## 2020-07-10 VITALS — BP 102/80 | HR 72 | Wt 156.0 lb

## 2020-07-10 DIAGNOSIS — E039 Hypothyroidism, unspecified: Secondary | ICD-10-CM | POA: Diagnosis not present

## 2020-07-10 DIAGNOSIS — B070 Plantar wart: Secondary | ICD-10-CM | POA: Diagnosis not present

## 2020-07-10 MED ORDER — LEVOTHYROXINE SODIUM 25 MCG PO TABS: 25.0000 ug | ORAL_TABLET | Freq: Every day | ORAL | 0 refills | Status: DC

## 2020-07-10 NOTE — Assessment & Plan Note (Signed)
Previously had wart excised by the podiatrist, but it has since grown back and an additional one popped up 3 months ago on the same toe.  Resistant to other forms of treatment such as salicylic acid or occlusion.  We will send in referral to podiatry again, but advised patient that since she is already been the patient with the podiatrist she may be able to just call them without the referral if this is quicker for her.

## 2020-07-10 NOTE — Patient Instructions (Signed)
It was nice to meet you today,  I will resend a referral to the podiatrist for your plantar wart, but you can also just call them yourself if you do not want to wait and they will likely be able to schedule you without our referral since you have already been there once.  I will order a TSH and refill your thyroid medication for another month, but Dr. Owens Shark as your PCP should refill it after that.  You are technically not eligible to get the vaccine until tomorrow, therefore we cannot give it to you today.  You can schedule appointment with [place] for a nurse visit tomorrow or you can have it done at the health department where he got the first shot.  Have a great day,  Clemetine Marker, MD

## 2020-07-10 NOTE — Assessment & Plan Note (Signed)
Checked throughout her pregnancy by the obstetrician.  Did not make any adjustments to her dosage during pregnancy which was 25 mcg daily.  Patient needs refill.  We will refill for 1 month, advised patient to submit refills to PCP after that.  Order TSH today and follow-up with patient through my chart.

## 2020-07-10 NOTE — Progress Notes (Signed)
    SUBJECTIVE:   CHIEF COMPLAINT / HPI:   Hypothyroid: Patient takes 25 mcg daily of levothyroxine.  She did not change dose while pregnant.  She had a TSH checked regularly during her pregnancy by [facility].  She is now 8 weeks postpartum.  She feels more fatigued than usual, but thinks this may be due to having 2 tablets in an infant.  She also feels hot a lot of the time.  She was originally diagnosed with hypothyroidism during one of her pregnancies, and has had multiple unsuccessful attempts to come off of it due to reduction in milk supply when she comes off.  Plantar wart: Patient previously went to the podiatrist and had plantar wart on her right foot removed.  It subsequently came back and for the past 3 months she has also developed a second 1 on the side of her first toe.  She has tried freezing them off several times and using adhesive bandaging but nothing has worked.  She was uncertain if she could schedule appointment directly with the podiatrist where she needed another referral.  PERTINENT  PMH / PSH: Hypothyroidism  OBJECTIVE:   BP 102/80   Pulse 72   Wt 156 lb (70.8 kg)   SpO2 97%   BMI 24.43 kg/m   General: Alert, oriented.  No acute distress. Neck: No thyromegaly, no cervical lymphadenopathy CV: Regular rate and rhythm, no murmurs. Pulmonary: Lungs clear to auscultation bilaterally, no wheezes or crackles. Skin: 2 flat wart on the plantar medial surface of the right first toe  ASSESSMENT/PLAN:   Hypothyroidism Checked throughout her pregnancy by the obstetrician.  Did not make any adjustments to her dosage during pregnancy which was 25 mcg daily.  Patient needs refill.  We will refill for 1 month, advised patient to submit refills to PCP after that.  Order TSH today and follow-up with patient through my chart.  Plantar wart, right foot Previously had wart excised by the podiatrist, but it has since grown back and an additional one popped up 3 months ago on the same  toe.  Resistant to other forms of treatment such as salicylic acid or occlusion.  We will send in referral to podiatry again, but advised patient that since she is already been the patient with the podiatrist she may be able to just call them without the referral if this is quicker for her.     Benay Pike, MD [place]

## 2020-07-11 ENCOUNTER — Ambulatory Visit: Payer: BLUE CROSS/BLUE SHIELD | Admitting: Family Medicine

## 2020-07-11 ENCOUNTER — Ambulatory Visit (INDEPENDENT_AMBULATORY_CARE_PROVIDER_SITE_OTHER): Payer: Medicaid Other

## 2020-07-11 DIAGNOSIS — Z23 Encounter for immunization: Secondary | ICD-10-CM

## 2020-07-11 LAB — TSH: TSH: 1.81 u[IU]/mL (ref 0.450–4.500)

## 2020-07-11 NOTE — Progress Notes (Signed)
   Covid-19 Vaccination Clinic  Name:  Olivia Shepard    MRN: [id number] DOB: [birth year]  07/11/2020  [name] was observed post Covid-19 immunization for 15 minutes without incident. She was provided with Vaccine Information Sheet and instruction to access the V-Safe system.   [name] was instructed to call 911 with any severe reactions post vaccine: Marland Kitchen Difficulty breathing  . Swelling of face and throat  . A fast heartbeat  . A bad rash all over body  . Dizziness and weakness   Patient presents in nurse clinic for #2 Covid Vaccine. Patient has lost her original card. I called the health department to confirm 1st dose, confirmation was given and patient due today for second.   #2 Covid Vaccine given LD without complication. I gave her an updated fully vaccinated card.

## 2020-07-12 ENCOUNTER — Telehealth: Payer: Self-pay | Admitting: Family Medicine

## 2020-07-12 ENCOUNTER — Encounter: Payer: Self-pay | Admitting: Family Medicine

## 2020-07-12 NOTE — Telephone Encounter (Signed)
**  After Hours/ Emergency Line Call**  Received a page to call [phone]) - [phone].  Patient: Olivia Shepard  Caller: Self  Confirmed name & DOB of patient with caller  Subjective:  Caller reports that she received second COVID 19 vaccine yesterday morning. She reports that she woke up tonight feeling arm pain at site of vaccination. She felt like her joints were throbbing, generalized malaise and felt as if she was going to pass out when she got up to get ibuprofen. She denies fevers, but does report that she had trouble falling asleep due to chills.    Objective:  Observations: NAD, no respiratory distress and speaking in full sentences.    Assessment & Plan  Ritika Hellickson is a 29 y.o. female with PMHx of hypothyroidism who calls with the following complaints and concerns: malaise, chills after second COVID 19 vaccine.  Patient is in no acute distress and speaking in full sentences over the phone.  Her symptoms are consistent with postvaccination symptoms.  Patient can take Tylenol or ibuprofen for generalized malaise.  If patient has episode of syncope or has worsening symptoms, she can go to the emergency department for further evaluation.  The symptoms will likely dissipate soon as her body mount immune response against vaccine.  If she continues to have the symptoms more than 24 to 48 hours after obtaining vaccine, patient can call the clinic for further management options.  -- Red flags discussed.   -- Will forward to PCP.  Wilber Oliphant, M.D.  PGY-3  [facility] 07/12/2020 4:41 AM

## 2020-07-19 ENCOUNTER — Encounter: Payer: Self-pay | Admitting: Family Medicine

## 2020-07-19 DIAGNOSIS — B353 Tinea pedis: Secondary | ICD-10-CM

## 2020-07-19 DIAGNOSIS — E039 Hypothyroidism, unspecified: Secondary | ICD-10-CM

## 2020-07-19 MED ORDER — LEVOTHYROXINE SODIUM 25 MCG PO TABS: 25.0000 ug | ORAL_TABLET | Freq: Every day | ORAL | 3 refills | Status: DC

## 2020-07-23 MED ORDER — TERBINAFINE HCL 1 % EX CREA: 1.0000 "application " | TOPICAL_CREAM | Freq: Two times a day (BID) | CUTANEOUS | 1 refills | Status: DC

## 2020-07-23 NOTE — Addendum Note (Signed)
Addended by: Owens Shark, Seymore Brodowski on: 07/23/2020 07:01 AM   Modules accepted: Orders

## 2020-08-20 ENCOUNTER — Other Ambulatory Visit: Payer: Self-pay

## 2020-08-20 ENCOUNTER — Encounter: Payer: Self-pay | Admitting: Podiatry

## 2020-08-20 ENCOUNTER — Ambulatory Visit (INDEPENDENT_AMBULATORY_CARE_PROVIDER_SITE_OTHER): Payer: Medicaid Other | Admitting: Podiatry

## 2020-08-20 DIAGNOSIS — D492 Neoplasm of unspecified behavior of bone, soft tissue, and skin: Secondary | ICD-10-CM

## 2020-08-20 DIAGNOSIS — B070 Plantar wart: Secondary | ICD-10-CM

## 2020-08-22 NOTE — Progress Notes (Signed)
Subjective:   Patient ID: Olivia Shepard, female   DOB: 29 y.o.   MRN: [id number]   HPI Patient presents stating that she is developed a lesion again on her right big toe and fourth metatarsal that are painful and its been over the last few months    ROS      Objective:  Physical Exam  Neurovascular status intact with lesion subright hallux and into the fourth metatarsal that upon debridement show pinpoint bleeding pain to lateral pressure     Assessment:  Verruca plantaris plantar aspect right x3     Plan:  Reviewed condition sterile prep and debrided all 3 lesions and applied chemical agent to create immune response with sterile dressings reappoint as needed

## 2020-09-16 ENCOUNTER — Encounter: Payer: Self-pay | Admitting: Podiatry

## 2020-09-18 DIAGNOSIS — O906 Postpartum mood disturbance: Secondary | ICD-10-CM | POA: Diagnosis not present

## 2020-09-26 ENCOUNTER — Other Ambulatory Visit: Payer: Self-pay

## 2020-09-26 ENCOUNTER — Ambulatory Visit (INDEPENDENT_AMBULATORY_CARE_PROVIDER_SITE_OTHER): Payer: Medicaid Other | Admitting: Podiatry

## 2020-09-26 DIAGNOSIS — B070 Plantar wart: Secondary | ICD-10-CM | POA: Diagnosis not present

## 2020-09-26 DIAGNOSIS — D492 Neoplasm of unspecified behavior of bone, soft tissue, and skin: Secondary | ICD-10-CM

## 2020-09-26 MED ORDER — FLUOROURACIL 5 % EX CREA: TOPICAL_CREAM | Freq: Two times a day (BID) | CUTANEOUS | 2 refills | Status: DC

## 2020-09-26 NOTE — Progress Notes (Signed)
Subjective:   Patient ID: Olivia Shepard, female   DOB: 29 y.o.   MRN: [id number]   HPI Patient presents stating I still have a lesion it is improved but the one of my big toe can be bothersome   ROS      Objective:  Physical Exam  Neurovascular status intact with keratotic lesion noted right hallux right fifth metatarsal that upon debridement shows pinpoint bleeding with reduction of the amount from previous visit     Assessment:  Verruca plantaris plantar right painful that has improved but present     Plan:  Sharp sterile debridement accomplished applied chemical agent to create immune response and instructed on what to do if any blistering were to occur.  Placed on Efudex to use at home and will be seen back as needed

## 2020-10-02 ENCOUNTER — Other Ambulatory Visit: Payer: Self-pay

## 2020-10-02 ENCOUNTER — Ambulatory Visit (INDEPENDENT_AMBULATORY_CARE_PROVIDER_SITE_OTHER): Payer: Medicaid Other | Admitting: Family Medicine

## 2020-10-02 ENCOUNTER — Encounter: Payer: Self-pay | Admitting: Family Medicine

## 2020-10-02 VITALS — BP 102/62 | HR 89 | Wt 153.4 lb

## 2020-10-02 DIAGNOSIS — M6208 Separation of muscle (nontraumatic), other site: Secondary | ICD-10-CM | POA: Diagnosis not present

## 2020-10-02 DIAGNOSIS — R1319 Other dysphagia: Secondary | ICD-10-CM | POA: Diagnosis not present

## 2020-10-02 DIAGNOSIS — M6289 Other specified disorders of muscle: Secondary | ICD-10-CM

## 2020-10-02 DIAGNOSIS — K219 Gastro-esophageal reflux disease without esophagitis: Secondary | ICD-10-CM

## 2020-10-02 NOTE — Patient Instructions (Signed)
It was wonderful to see you today.  Please bring ALL of your medications with you to every visit.  Happy Thanksgiving---thanks for coming in today!   Today we talked about:  -Miralax- 1 scoop in 8 ounces of water TWICE per day--goa is one SOFT (very soft) painless stool per day  - You will be called by Gastroenterology  - You will be called by Pelvic PT   - Let me know if you do NOT hear back from these two places in ~ 2 weeks    Thank you for choosing [facility].   Please call [phone] with any questions about today's appointment.  Please be sure to schedule follow up at the front  desk before you leave today.   Dorris Singh, MD  Family Medicine

## 2020-10-02 NOTE — Progress Notes (Signed)
SUBJECTIVE:   CHIEF COMPLAINT / HPI:   Olivia Shepard is a pleasant 29 year old woman with history significant for hypothyroidism and esophageal stricture presenting today for follow up.  She delivered via SVD in June 2021--infant is just over 40 months old. She is joined by her four children and two other kids (friends) today. She reports she is doing well. Had some postpartum anxiety which resulted in an increased dose of Sertraline. TSH was checked in August, normal, no changes to Synthroid. She is breastfeeding and feels her anxiety is doing better.   The patient reports a many year history of dysphagia. This was last a problem about 10 years ago. She has a history of 'reflux', typical heartburn symptoms. She has had a prior dilation due to esophageal stricture formation while in college. This improved dysphagia until a few months ago in postpartum period. Reflux symptoms worsened in pregnancy. She is taking lansoprazole once per day. She denies excessive weight loss, nausea, melena. No OTC medication use.  She endorses dysphagia to solid foods, including the the point where 'food get stuck' and she had to induce emesis once to relieve this. She has constipation with hemorrhoids as below  The patient reports worsening pelvic floor dysfunction and 'diastasis'. She has stress urinary incontinence. She has chronic constipation and reports a history of hemorrhoids. She reports a she was seen by pelvic PT X1 who noted she had a rectocele. She was instructed to use intravaginal pressure (with digits) during defecation. She has attempted this but feels a 'bulge, like my colon' in the area. Drinks 80 ounces of water to avoid constipation but has hard stools. Has had ongoing constipation in pregnancy. Has a family history of colon cancer in great grandparents (paternal). Father has Barrett's. 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   Patient is due for Pap today--will defer to when able to  have examination room space as has other children with her (friend had emergency). Using natural family planning for pregnancy. Taking prenatal   PERTINENT  PMH / PSH/Family/Social History : updated, updated obstetric history   OBJECTIVE:   BP 102/62   Pulse 89   Wt 153 lb 6.4 oz (69.6 kg)   SpO2 99%   BMI 24.03 kg/m   Today's weight:  Last Weight  Most recent update: 10/02/2020  9:16 AM   Weight  69.6 kg (153 lb 6.4 oz)           Review of prior weights: Filed Weights   10/02/20 0915  Weight: 153 lb 6.4 oz (69.6 kg)    Cardiac: Regular rate and rhythm. Normal S1/S2. No murmurs, rubs, or gallops appreciated. Lungs: Clear bilaterally to ascultation.  Abdomen: Normoactive bowel sounds. Abdomen is soft, no masses + small diastasisNo tenderness to deep or light palpation. No rebound or guarding.  Psych: Pleasant and appropriate   ASSESSMENT/PLAN:   GERD (gastroesophageal reflux disease) Given new dysphagia and history of stricture, refer to GI. Consider ferritin, B12 as vitamin deficiency can cause this at follow up.   Pelvic floor dysfunction Likely related to constipation and vaginal delivery which does slightly increase risk. Discussed constipation management, goal to have very soft stool each day. Will complete exam for rectocele and Pap at follow up.     HCM UTD on Flu and COVID- received both at work  Pap and rectocele exam at follow up  Updated history tab   Dorris Singh, MD  [facility] Medicine Teaching  [place]

## 2020-10-02 NOTE — Assessment & Plan Note (Signed)
Likely related to constipation and vaginal delivery which does slightly increase risk. Discussed constipation management, goal to have very soft stool each day. Will complete exam for rectocele and Pap at follow up.

## 2020-10-02 NOTE — Assessment & Plan Note (Signed)
Given new dysphagia and history of stricture, refer to GI. Consider ferritin, B12 as vitamin deficiency can cause this at follow up.

## 2020-11-05 ENCOUNTER — Other Ambulatory Visit: Payer: Self-pay

## 2020-11-05 DIAGNOSIS — E039 Hypothyroidism, unspecified: Secondary | ICD-10-CM

## 2020-11-05 MED ORDER — LEVOTHYROXINE SODIUM 25 MCG PO TABS: 25.0000 ug | ORAL_TABLET | Freq: Every day | ORAL | 3 refills | Status: DC

## 2020-11-13 ENCOUNTER — Ambulatory Visit: Payer: Medicaid Other | Admitting: Nurse Practitioner

## 2020-11-13 NOTE — Progress Notes (Deleted)
11/13/2020 Bijal Siglin [id number] [birth year]   CHIEF COMPLAINT:   HISTORY OF PRESENT ILLNESS: Olivia Shepard is a 30 year old female with a past medical history of anxiety, hypothyroidism, anemia, GERD.  CBC Latest Ref Rng & Units 08/27/2018 04/07/2018 04/06/2018  WBC 3.4 - 10.8 x10E3/uL 4.8 8.0 12.8(H)  Hemoglobin 11.1 - 15.9 g/dL 13.1 9.2(L) 10.9(L)  Hematocrit 34.0 - 46.6 % 39.0 28.3(L) 34.1(L)  Platelets 150 - 450 x10E3/uL 222 137(L) 155    CMP Latest Ref Rng & Units 08/27/2018 12/27/2015  Glucose 65 - 99 mg/dL 76 71  BUN 6 - 20 mg/dL 17 12  Creatinine 0.57 - 1.00 mg/dL 0.75 0.55  Sodium 134 - 144 mmol/L 144 140  Potassium 3.5 - 5.2 mmol/L 4.3 3.7  Chloride 96 - 106 mmol/L 104 104  CO2 20 - 29 mmol/L 25 27  Calcium 8.7 - 10.2 mg/dL 10.1 9.3   Past Medical History:  Diagnosis Date  . Anemia   . Anxiety   . GERD (gastroesophageal reflux disease)   . Hypothyroidism    Past Surgical History:  Procedure Laterality Date  . ESOPHAGEAL DILATION     Social History:  Family History: Paternal great grandfather and paternal great grandmother with history of colon cancer.  Maternal grandmother with history of lung cancer.  Mother with history of diabetes.  Father with history of atrial fibrillation.  reports that she has never smoked. She has never used smokeless tobacco. She reports that she does not drink alcohol and does not use drugs. family history includes Atrial fibrillation in her father; Colon cancer in her paternal great-grandfather and paternal great-grandmother; Diabetes in her mother; Lung cancer in her maternal grandmother. No Known Allergies    Outpatient Encounter Medications as of 11/13/2020  Medication Sig  . Butenafine HCl 1 % cream Apply 1 application topically 2 (two) times daily. (Patient not taking: Reported on 10/02/2020)  . Cholecalciferol 25 MCG (1000 UT) tablet Take by mouth.  . fluorouracil (EFUDEX) 5 % cream Apply topically 2 (two) times daily.  (Patient not taking: Reported on 10/02/2020)  . influenza vac split quadrivalent PF (AFLURIA QUADRIVALENT) 0.5 ML injection  (Patient not taking: Reported on 10/02/2020)  . lansoprazole (PREVACID) 30 MG capsule Take 30 mg by mouth daily.  Marland Kitchen levothyroxine (SYNTHROID) 25 MCG tablet Take 1 tablet (25 mcg total) by mouth daily before breakfast.  . nystatin-triamcinolone (MYCOLOG II) cream Apply topically.  . Salicylic Acid 17 % KIT Apply topically.  . sertraline (ZOLOFT) 100 MG tablet Take by mouth.  . terbinafine (LAMISIL) 1 % cream Apply 1 application topically 2 (two) times daily.  . traZODone (DESYREL) 50 MG tablet Take by mouth. (Patient not taking: Reported on 10/02/2020)   No facility-administered encounter medications on file as of 11/13/2020.     REVIEW OF SYSTEMS:   Gen: Denies fever, sweats or chills. No weight loss.  CV: Denies chest pain, palpitations or edema. Resp: Denies cough, shortness of breath of hemoptysis.  GI: Denies heartburn, dysphagia, stomach or lower abdominal pain. No diarrhea or constipation.  GU : Denies urinary burning, blood in urine, increased urinary frequency or incontinence. MS: Denies joint pain, muscles aches or weakness. Derm: Denies rash, itchiness, skin lesions or unhealing ulcers. Psych: Denies depression, anxiety, memory loss, suicidal ideation and confusion. Heme: Denies bruising, bleeding. Neuro:  Denies headaches, dizziness or paresthesias. Endo:  Denies any problems with DM, thyroid or adrenal function.    PHYSICAL EXAM: There were no vitals taken for this visit.  General: Well developed ... in no acute distress. Head: Normocephalic and atraumatic. Eyes:  Sclerae non-icteric, conjunctive pink. Ears: Normal auditory acuity. Mouth: Dentition intact. No ulcers or lesions.  Neck: Supple, no lymphadenopathy or thyromegaly.  Lungs: Clear bilaterally to auscultation without wheezes, crackles or rhonchi. Heart: Regular rate and rhythm. No  murmur, rub or gallop appreciated.  Abdomen: Soft, nontender, non distended. No masses. No hepatosplenomegaly. Normoactive bowel sounds x 4 quadrants.  Rectal:  Musculoskeletal: Symmetrical with no gross deformities. Skin: Warm and dry. No rash or lesions on visible extremities. Extremities: No edema. Neurological: Alert oriented x 4, no focal deficits.  Psychological:  Alert and cooperative. Normal mood and affect.  ASSESSMENT AND PLAN:    CC:  Martyn Malay, MD

## 2020-11-14 ENCOUNTER — Encounter: Payer: Self-pay | Admitting: Nurse Practitioner

## 2020-11-14 ENCOUNTER — Ambulatory Visit (INDEPENDENT_AMBULATORY_CARE_PROVIDER_SITE_OTHER): Payer: Medicaid Other | Admitting: Nurse Practitioner

## 2020-11-14 VITALS — BP 90/60 | HR 85 | Ht 67.0 in | Wt 149.0 lb

## 2020-11-14 DIAGNOSIS — R1310 Dysphagia, unspecified: Secondary | ICD-10-CM

## 2020-11-14 NOTE — Progress Notes (Signed)
Agree with the assessment and plan as outlined by Alcide Evener, NP.  Plan for expedited EGD with esophageal dilation and esophageal biopsies as appropriate.  Evaluate for erosive esophagitis, LES laxity, hiatal hernia at time of EGD as well.  Will route note to Cathlyn Parsons as well for anesthesia review prior to procedure.  Doristine Locks, DO, [facility]

## 2020-11-14 NOTE — Patient Instructions (Addendum)
If you are age 32 or older, your body mass index should be between 23-30. Your Body mass index is 23.34 kg/m. If this is out of the aforementioned range listed, please consider follow up with your Primary Care Provider.  If you are age 44 or younger, your body mass index should be between 19-25. Your Body mass index is 23.34 kg/m. If this is out of the aformentioned range listed, please consider follow up with your Primary Care Provider.    You have been scheduled for an endoscopy. Please follow written instructions given to you at your visit today. If you use inhalers (even only as needed), please bring them with you on the day of your procedure.    1. After your endoscopy procedure, pump breast milk for 8 hours and dump it, please verify this with your         pediatrician first.  2. Please puree your food, and drink 2 cans daily of Boost or Ensure.  3. If your symptoms worsen, please go to the nearest emergency room.  4. One week before the endoscopy, please go to the lab for a blood test to make sure you are not pregnant.   It was great seeing you today!  Thank you for entrusting me with your care and choosing [facility].  Alcide Evener, NP

## 2020-11-14 NOTE — Progress Notes (Signed)
11/14/2020 Olivia Shepard [id number] 1991/08/14   CHIEF COMPLAINT: Dysphagia  HISTORY OF PRESENT ILLNESS: Olivia Shepard is a 30 year old female with a past medical history of anxiety, hypothyroidism and GERD.  She presents to our office today for further evaluation regarding heartburn and dysphagia.  She is accompanied by her 104 delightful young children and infant son.  She has a history of GERD and dysphagia dating back to her college years when she underwent an EGD with esophageal dilatation in [place], [place]. She has been on various PPIs since that time.  She has worse reflux symptoms during her 4 pregnancies. She vomits when her reflux is severe. She developed dysphagia 3 months ago which has progressively worsened. She describes having food which gets stuck to the upper esophagus 3 to 4 times weekly. She drinks water and the stuck food typically passes down. However, she described 3 episodes over the past 3 months when the stuck food did not pass down, she could not speak or breath and she hit her chest with her fist to expel the stuck food. She is often home alone with her 3 small children and 62 month old infant son. She is breast feeding her infant son. She is taking Lansoprazole 25m po daily as prescribed by her OB.  No NSAID use.  As noted above, she underwent an EGD with esophageal dilatation in her earl 20's due to dysphagia. Her blood pressure dropped to "40/20" after she received the sedation so the EGD was completed while she was awake without further sedation.  She denies having any upper or lower abdominal pain.  She has constipation which is well controlled by taking MiraLAX daily.  No rectal bleeding or black stools.  Her paternal great grandmother and grandfather had colon cancer.  Father with history of Barrett's esophagus.  No menstrual cycle while breast-feeding.  She and her spouse currently utilize natural family planning for contraception.  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   Past Medical History:  Diagnosis Date  . Anemia   . Anxiety   . GERD (gastroesophageal reflux disease)   . Hypothyroidism    Past Surgical History:  Procedure Laterality Date  . ESOPHAGEAL DILATION     Social History: She is married.  She is a rEquities trader  No tobacco use.  No alcohol use.  No drug use.  Family History: Father with history of Barrett's esophagus.  Mother with history of diabetes.  Paternal great grandfather and paternal great grandmother with history of colon cancer.  Maternal grandmother with lung cancer.  No Known Allergies    Outpatient Encounter Medications as of 11/14/2020  Medication Sig  . Cholecalciferol 25 MCG (1000 UT) tablet Take by mouth.  . lansoprazole (PREVACID) 30 MG capsule Take 30 mg by mouth daily.  .Marland Kitchenlevothyroxine (SYNTHROID) 25 MCG tablet Take 1 tablet (25 mcg total) by mouth daily before breakfast.  . nystatin-triamcinolone (MYCOLOG II) cream Apply topically.  . sertraline (ZOLOFT) 100 MG tablet Take by mouth.  . traZODone (DESYREL) 50 MG tablet Take by mouth at bedtime as needed.  . [DISCONTINUED] Butenafine HCl 1 % cream Apply 1 application topically 2 (two) times daily. (Patient not taking: Reported on 10/02/2020)  . [DISCONTINUED] fluorouracil (EFUDEX) 5 % cream Apply topically 2 (two) times daily. (Patient not taking: Reported on 10/02/2020)  . [DISCONTINUED] influenza vac split quadrivalent PF (AFLURIA QUADRIVALENT) 0.5 ML injection  (Patient not taking: Reported on 10/02/2020)  . [DISCONTINUED] Salicylic Acid 17 %  KIT Apply topically.  . [DISCONTINUED] terbinafine (LAMISIL) 1 % cream Apply 1 application topically 2 (two) times daily.   No facility-administered encounter medications on file as of 11/14/2020.     REVIEW OF SYSTEMS: Gen: Denies fever, sweats or chills. No weight loss.  CV: Denies chest pain, palpitations or edema. Resp: Denies cough, shortness of breath of hemoptysis.  GI: See HPI. GU : Denies  urinary burning, blood in urine, increased urinary frequency or incontinence. MS: Denies joint pain, muscles aches or weakness. Derm: Denies rash, itchiness, skin lesions or unhealing ulcers. Psych: + Postpartum anxiety. Heme: Denies bruising, bleeding. Neuro:  Denies headaches, dizziness or paresthesias. Endo:  Denies any problems with DM, thyroid or adrenal function.  PHYSICAL EXAM: BP 90/60   Pulse 85   Ht 5' 7" (1.702 m)   Wt 149 lb (67.6 kg)   BMI 23.34 kg/m  General: Well developed 30 year old female in no acute distress. Head: Normocephalic and atraumatic. Eyes:  Sclerae non-icteric, conjunctive pink. Ears: Normal auditory acuity. Mouth: Dentition intact. No ulcers or lesions.  Neck: Supple, no lymphadenopathy or thyromegaly.  Lungs: Clear bilaterally to auscultation without wheezes, crackles or rhonchi. Heart: Regular rate and rhythm. No murmur, rub or gallop appreciated.  Abdomen: Soft, nontender, non distended. No masses. No hepatosplenomegaly. Normoactive bowel sounds x 4 quadrants.  Rectal: Deferred. Musculoskeletal: Symmetrical with no gross deformities. Skin: Warm and dry. No rash or lesions on visible extremities. Extremities: No edema. Neurological: Alert oriented x 4, no focal deficits.  Psychological:  Alert and cooperative. Normal mood and affect.  ASSESSMENT AND PLAN:  41.  30 year old female with a history of GERD and dysphagia s/p EGD with esophageal dilatation during her college years. She became hypotensive after she received IV sedation and the EGD with dilatation was completed without further sedation or anesthesia. Recurrent GERD, worse during pregnancies. Dysphagia recurred 3 months ago which has progressively worsened with 3 episodes when food became stuck and she could not speak or breathe, esophagus felt tight and she hit her chest to expel the stuck food.  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 EGD with esophageal dilatation scheduled 11/28/2020. EGD benefits and risks discussed including risk with sedation, risk of bleeding, perforation and infection. -Pump and dump breast milk for 8 hours post EGD, patient to verify these instructions with her pediatrician -check beta HCG 1 week prior to EGD date -Puree all food, drink 2 cans of Ensure or Boost. No bread.  -To the ED if symptoms worsen. Patient to keep phone with her at all times.  -Continue Lansoprazole 30 mg p.o. daily -Further follow-up to be determined after the above evaluation completed        CC:  Martyn Malay, MD

## 2020-11-22 ENCOUNTER — Other Ambulatory Visit (INDEPENDENT_AMBULATORY_CARE_PROVIDER_SITE_OTHER): Payer: Medicaid Other

## 2020-11-22 DIAGNOSIS — R1310 Dysphagia, unspecified: Secondary | ICD-10-CM | POA: Diagnosis not present

## 2020-11-22 LAB — HCG, QUANTITATIVE, PREGNANCY: Quantitative HCG: 0.63 m[IU]/mL

## 2020-11-23 ENCOUNTER — Ambulatory Visit: Payer: Medicaid Other | Attending: Family Medicine | Admitting: Physical Therapy

## 2020-11-23 ENCOUNTER — Other Ambulatory Visit: Payer: Self-pay

## 2020-11-23 DIAGNOSIS — R293 Abnormal posture: Secondary | ICD-10-CM

## 2020-11-23 DIAGNOSIS — R279 Unspecified lack of coordination: Secondary | ICD-10-CM | POA: Diagnosis present

## 2020-11-23 DIAGNOSIS — M6281 Muscle weakness (generalized): Secondary | ICD-10-CM | POA: Diagnosis not present

## 2020-11-23 NOTE — Therapy (Signed)
[facility] [address], [place], [place], [address] Phone: [phone]   Fax:  [phone]  Physical Therapy Evaluation  Patient Details  Name: Olivia Shepard MRN: [id number] Date of Birth: Mar 05, 1991 Referring Provider (PT): Martyn Malay, MD   Encounter Date: 11/23/2020   PT End of Session - 11/23/20 0958    Visit Number 1    Date for PT Re-Evaluation 02/15/21    Authorization Type mcaid prep healthy blue    PT Start Time [id number]   arrived late   PT Stop Time 0931    PT Time Calculation (min) 35 min    Activity Tolerance Patient tolerated treatment well    Behavior During Therapy [facility] for tasks assessed/performed           Past Medical History:  Diagnosis Date  . Anemia   . Anxiety   . GERD (gastroesophageal reflux disease)   . Hypothyroidism     Past Surgical History:  Procedure Laterality Date  . ESOPHAGEAL DILATION      There were no vitals filed for this visit.    Subjective Assessment - 11/23/20 0900    Subjective SUI with sneezing and coughing almost every time, DRA and sore pain lower ab, Lt SI joint pain.  Pt swims and works out at home.  Pt is having constipation and feeling rectocele is painful when pushing on it to try having a BM.    Limitations Lifting    Patient Stated Goals reduce pain    Currently in Pain? No/denies              [facility] PT Assessment - 11/23/20 0001      Assessment   Medical Diagnosis M62.08 (ICD-10-CM) - Diastasis recti;M62.89 (ICD-10-CM) - Pelvic floor dysfunction    Referring Provider (PT) Martyn Malay, MD    Onset Date/Surgical Date --   5 years, mostly since previous pregnancy   Prior Therapy one visit in the past      Balance Screen   Has the patient fallen in the past 6 months No      [place] residence    Living Arrangements Spouse/significant other;Children   4 children     Prior Function   Level of  Independence Independent    Vocation Part time employment    Vocation Requirements labor and delivery nurse; move patiens and pushing people      Cognition   Overall Cognitive Status Within Functional Limits for tasks assessed      Posture/Postural Control   Posture/Postural Control Postural limitations    Postural Limitations Rounded Shoulders;Increased lumbar lordosis;Anterior pelvic tilt;Weight shift right    Posture Comments trunk flexed to the Rt in thoracic; Rt arch collapsed      ROM / Strength   AROM / PROM / Strength AROM;Strength      AROM   Overall AROM Comments fwd flexion 50% and Lt SI locked      Strength   Overall Strength Comments Lt hip add, abd, ext 4/5      Flexibility   Soft Tissue Assessment /Muscle Length yes    Hamstrings 80%      Palpation   Palpation comment Lt SI joint TTP      Special Tests   Other special tests compression with ASLR improved ASLR; no pain with compress in SIjoint      Ambulation/Gait   Gait Pattern Within Functional Limits  Objective measurements completed on examination: See above findings.     Pelvic Floor Special Questions - 11/23/20 0001    Prior Pelvic/Prostate Exam Yes    Are you Pregnant or attempting pregnancy? No    Prior Pregnancies Yes    Number of Pregnancies 4    Number of Vaginal Deliveries 4   2nd deg tear with 1-3   Any difficulty with labor and deliveries --   tearing 2nd deg   Currently Sexually Active Yes    Is this Painful No    Marinoff Scale no problems    Urinary Leakage Yes    How often some    Pad use no    Activities that cause leaking Coughing;Sneezing;With strong urge    Urinary urgency Yes   50% of the time   Perineal Body/Introitus  Normal    Pelvic Floor Internal Exam pt identity confirmed and informed consent given to perform assessment    Exam Type Vaginal    Palpation Lt side weaker than Rt    Strength fair squeeze, definite lift    Strength # of  reps --   6 quick flicks but not relaxed all the way   Strength # of seconds 20            [facility] Adult PT Treatment/Exercise - 11/23/20 0001      Self-Care   Self-Care Other Self-Care Comments    Other Self-Care Comments  toileting techniques                  PT Education - 11/23/20 0957    Education Details toilet techniques    Person(s) Educated Patient    Methods Explanation;Demonstration;Handout;Verbal cues    Comprehension Verbalized understanding;Returned demonstration            PT Short Term Goals - 11/23/20 0948      PT SHORT TERM GOAL #1   Title ind with urge and toilet techniques    Time 4    Period Weeks    Status New    Target Date 12/21/20      PT SHORT TERM GOAL #2   Title ind with initial HEP for core/TrA strength    Time 4    Period Weeks    Status New    Target Date 12/21/20             PT Long Term Goals - 11/23/20 0952      PT LONG TERM GOAL #1   Title Ind with advanced HEP for return to exercise without back pain    Time 12    Period Weeks    Status New    Target Date 02/15/21      PT LONG TERM GOAL #2   Title pt will report able to avoid leakage with coughing and sneezing at least 80% of the time    Time 12    Period Weeks    Status New    Target Date 02/15/21      PT LONG TERM GOAL #3   Title Pt will report 50% easier time having a BM due to improved core strength creating adequate intra-abdominal pressure    Time 12    Period Weeks    Status New    Target Date 02/15/21      PT LONG TERM GOAL #4   Title pt will report at least 75% less back pain during a typical day    Time 12    Period Weeks  Status New    Target Date 02/15/21                  Plan - 11/23/20 0930    Clinical Impression Statement Pt presents to therapy with multiple complaints that are related to pregnancies and deliveries. She has history of 4 vaginal deliveries with tearing occuring with first 3.  Pt has rectocele and mild  cystocele.  Pt has DRA of 2 fingers at and 1 inch above umbilicus.  Lt hip weakness abduction, adduction, ext.  Pt has posture with trunk shifted into Rt and Lt SI joint is locked and TTP, and Rt arch collapsed.  Pt has moderate pelvic floor strength of 3/5 and can hold 20 seconds.  Quick flicks are more challanging due to difficutly releaxing pelvic floor after she engages those muscles.  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    Personal Factors and Comorbidities Time since onset of injury/illness/exacerbation;Comorbidity 2    Comorbidities 4 vaginal deliveries; 2nd degree tears    Examination-Activity Limitations Bend;Caring for Others;Carry;Lift;Toileting;Continence    Examination-Participation Restrictions Community Activity    Stability/Clinical Decision Making Evolving/Moderate complexity    Clinical Decision Making Moderate    Rehab Potential Excellent    PT Frequency 2x / week    PT Duration 12 weeks    PT Treatment/Interventions ADLs/Self Care Home Management;Cryotherapy;Neuromuscular re-education;Manual techniques;Patient/family education;Therapeutic exercise;Therapeutic activities;Dry needling;Passive range of motion;Taping    PT Next Visit Plan address TrA activation with basic core ex's, fascial release to bil hip flexors; posture; urge techniques and f/u on toileting techniques    PT Home Exercise Plan gave toileting handout at eval    Consulted and Agree with Plan of Care Patient           Patient will benefit from skilled therapeutic intervention in order to improve the following deficits and impairments:  Pain,Postural dysfunction,Decreased strength,Increased muscle spasms,Increased fascial restricitons,Decreased coordination,Decreased range of motion,Impaired flexibility  Visit Diagnosis: Muscle weakness (generalized)  Unspecified lack of coordination  Abnormal  posture     Problem List Patient Active Problem List   Diagnosis Date Noted  . Dysphagia 11/14/2020  . Pelvic floor dysfunction 10/02/2020  . Varicose veins of legs, antepartum 12/19/2019  . Plantar wart, right foot 03/18/2018  . GERD (gastroesophageal reflux disease) 07/25/2016  . Tinea pedis of both feet 07/25/2016  . Hypothyroidism 07/19/2015  . 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  . Chronic UTI 10/24/2013  . IBS (irritable bowel syndrome) 06/29/2012    Jule Ser, PT 11/23/2020, 11:16 AM  [facility] [address], [place], [place], [address] Phone: [phone]   Fax:  [phone]  Name: Olivia Shepard MRN: [id number] Date of Birth: 1991/05/04

## 2020-11-23 NOTE — Patient Instructions (Addendum)
Toileting Techniques for Bowel Movements (Defecation) Using your belly (abdomen) and pelvic floor muscles to have a bowel movement is usually instinctive.  Sometimes people can have problems with these muscles and have to relearn proper defecation (emptying) techniques.  If you have weakness in your muscles, organs that are falling out, decreased sensation in your pelvis, or ignore your urge to go, you may find yourself straining to have a bowel movement.  You are straining if you are: . holding your breath or taking in a huge gulp of air and holding it  . keeping your lips and jaw tensed and closed tightly . turning red in the face because of excessive pushing or forcing . developing or worsening your  hemorrhoids . getting faint while pushing . not emptying completely and have to defecate many times a day  If you are straining, you are actually making it harder for yourself to have a bowel movement.  Many people find they are pulling up with the pelvic floor muscles and closing off instead of opening the anus. Due to lack pelvic floor relaxation and coordination the abdominal muscles, one has to work harder to push the feces out.  Many people have never been taught how to defecate efficiently and effectively.  Notice what happens to your body when you are having a bowel movement.  While you are sitting on the toilet pay attention to the following areas: . Jaw and mouth position . Angle of your hips   . Whether your feet touch the ground or not . Arm placement  . Spine position . Waist . Belly tension . Anus (opening of the anal canal)  An Evacuation/Defecation Plan   Here are the 4 basic points:  1. Lean forward enough for your elbows to rest on your knees 2. Support your feet on the floor or use a low stool if your feet don't touch the floor  3. Push out your belly as if you have swallowed a beach ball-you should feel a widening of your waist 4. Open and relax your pelvic floor muscles,  rather than tightening around the anus      The following conditions my require modifications to your toileting posture:  . If you have had surgery in the past that limits your back, hip, pelvic, knee or ankle flexibility . Constipation   Your healthcare practitioner may make the following additional suggestions and adjustments:  1) Sit on the toilet  a) Make sure your feet are supported. b) Notice your hip angle and spine position-most people find it effective to lean forward or raise their knees, which can help the muscles around the anus to relax  c) When you lean forward, place your forearms on your thighs for support  2) Relax suggestions a) Breath deeply in through your nose and out slowly through your mouth as if you are smelling the flowers and blowing out the candles. b) To become aware of how to relax your muscles, contracting and releasing muscles can be helpful.  Pull your pelvic floor muscles in tightly by using the image of holding back gas, or closing around the anus (visualize making a circle smaller) and lifting the anus up and in.  Then release the muscles and your anus should drop down and feel open. Repeat 5 times ending with the feeling of relaxation. c) Keep your pelvic floor muscles relaxed; let your belly bulge out. d) The digestive tract starts at the mouth and ends at the anal opening, so be   sure to relax both ends of the tube.  Place your tongue on the roof of your mouth with your teeth separated.  This helps relax your mouth and will help to relax the anus at the same time.  3) Empty (defecation) a) Keep your pelvic floor and sphincter relaxed, then bulge your anal muscles.  Make the anal opening wide.  b) Stick your belly out as if you have swallowed a beach ball. c) Make your belly wall hard using your belly muscles while continuing to breathe. Doing this makes it easier to open your anus. d) Breath out and give a grunt (or try using other sounds such as  ahhhh, shhhhh, ohhhh or grrrrrrr).  4) Finish a) As you finish your bowel movement, pull the pelvic floor muscles up and in.  This will leave your anus in the proper place rather than remaining pushed out and down. If you leave your anus pushed out and down, it will start to feel as though that is normal and give you incorrect signals about needing to have a bowel movement.   [facility] [address] [place],  [address]  

## 2020-11-27 ENCOUNTER — Telehealth: Payer: Self-pay

## 2020-11-27 NOTE — Telephone Encounter (Signed)
Pt is scheduled to see Dr. Bryan Lemma on 11/28/20 at 2:30. Left VM to see if pt is interested in coming at the 1100 procedure time.

## 2020-11-28 ENCOUNTER — Other Ambulatory Visit: Payer: Self-pay

## 2020-11-28 ENCOUNTER — Ambulatory Visit (AMBULATORY_SURGERY_CENTER): Payer: Medicaid Other | Admitting: Gastroenterology

## 2020-11-28 ENCOUNTER — Encounter: Payer: Self-pay | Admitting: Gastroenterology

## 2020-11-28 VITALS — BP 89/51 | HR 82 | Temp 96.9°F | Resp 17 | Ht 67.0 in | Wt 149.0 lb

## 2020-11-28 DIAGNOSIS — K2100 Gastro-esophageal reflux disease with esophagitis, without bleeding: Secondary | ICD-10-CM | POA: Diagnosis not present

## 2020-11-28 DIAGNOSIS — R1310 Dysphagia, unspecified: Secondary | ICD-10-CM

## 2020-11-28 DIAGNOSIS — K449 Diaphragmatic hernia without obstruction or gangrene: Secondary | ICD-10-CM | POA: Diagnosis not present

## 2020-11-28 DIAGNOSIS — K219 Gastro-esophageal reflux disease without esophagitis: Secondary | ICD-10-CM | POA: Diagnosis not present

## 2020-11-28 DIAGNOSIS — K222 Esophageal obstruction: Secondary | ICD-10-CM | POA: Diagnosis not present

## 2020-11-28 DIAGNOSIS — E039 Hypothyroidism, unspecified: Secondary | ICD-10-CM | POA: Diagnosis not present

## 2020-11-28 MED ORDER — SODIUM CHLORIDE 0.9 % IV SOLN: 500.0000 mL | Freq: Once | INTRAVENOUS | Status: DC

## 2020-11-28 NOTE — [place] Notes (Signed)
1130 Robinul 0.1 mg IV given due large amount of secretions upon assessment.  MD made aware, vss 

## 2020-11-28 NOTE — Op Note (Signed)
[place] Patient Name: Olivia Shepard Procedure Date: 11/28/2020 11:25 AM MRN: [id number] Endoscopist: Gerrit Heck , MD Age: 30 Referring MD:  Date of Birth: [birth year] Gender: Female Account #: [id number] Procedure:                Upper GI endoscopy Indications:              Dysphagia, Heartburn, Suspected esophageal reflux Medicines:                Monitored Anesthesia Care Procedure:                Pre-Anesthesia Assessment:                           - Prior to the procedure, a History and Physical                            was performed, and patient medications and                            allergies were reviewed. The patient's tolerance of                            previous anesthesia was also reviewed. The risks                            and benefits of the procedure and the sedation                            options and risks were discussed with the patient.                            All questions were answered, and informed consent                            was obtained. Prior Anticoagulants: The patient has                            taken no previous anticoagulant or antiplatelet                            agents. ASA Grade Assessment: II - A patient with                            mild systemic disease. After reviewing the risks                            and benefits, the patient was deemed in                            satisfactory condition to undergo the procedure.                           After obtaining informed consent, the endoscope was  passed under direct vision. Throughout the                            procedure, the patient's blood pressure, pulse, and                            oxygen saturations were monitored continuously. The                            Endoscope was introduced through the mouth, and                            advanced to the second part of duodenum. The upper                            GI  endoscopy was accomplished without difficulty.                            The patient tolerated the procedure well. Scope In: Scope Out: Findings:                 One benign-appearing, intrinsic mild stenosis was                            found 38 cm from the incisors. This stenosis                            measured less than one cm (in length). The stenosis                            was traversed. A TTS dilator was passed through the                            scope. Dilation with an 18-19-20 mm balloon dilator                            was performed to 19 mm. The dilation site was                            examined and showed mild mucosal disruption.                            Estimated blood loss was minimal.                           The upper third of the esophagus and middle third                            of the esophagus were normal. Biopsies were                            obtained from the proximal and distal esophagus  with cold forceps for histology of suspected                            eosinophilic esophagitis.                           LA Grade A (one or more mucosal breaks less than 5                            mm, not extending between tops of 2 mucosal folds)                            esophagitis with no bleeding was found in the lower                            third of the esophagus.                           A 1-2 cm sliding type hiatal hernia was present.                           The gastroesophageal flap valve was visualized                            endoscopically and classified as Hill Grade III                            (minimal fold, loose to endoscope, hiatal hernia                            likely).                           The entire examined stomach was normal.                           The examined duodenum was normal. Complications:            No immediate complications. Estimated Blood Loss:     Estimated blood  loss was minimal. Impression:               - Benign-appearing esophageal stenosis. Dilated.                           - Normal upper third of esophagus and middle third                            of esophagus. Biopsied.                           - LA Grade A reflux esophagitis with no bleeding.                           - 1-2 cm sliding type hiatal hernia.                           -  Gastroesophageal flap valve classified as Hill                            Grade III (minimal fold, loose to endoscope, hiatal                            hernia likely).                           - Normal stomach.                           - Normal examined duodenum. Recommendation:           - Patient has a contact number available for                            emergencies. The signs and symptoms of potential                            delayed complications were discussed with the                            patient. Return to normal activities tomorrow.                            Written discharge instructions were provided to the                            patient.                           - Soft diet today.                           - Continue present medications.                           - Await pathology results.                           - Repeat upper endoscopy PRN.                           - Return to GI office at appointment to be                            scheduled. Gerrit Heck, MD 11/28/2020 11:47:17 AM

## 2020-11-28 NOTE — [place] Notes (Signed)
Report given to PACU, vss 

## 2020-11-28 NOTE — Patient Instructions (Signed)
YOU HAD AN ENDOSCOPIC PROCEDURE TODAY AT THE [facility]:   Refer to the procedure report that was given to you for any specific questions about what was found during the examination.  If the procedure report does not answer your questions, please call your gastroenterologist to clarify.  If you requested that your care partner not be given the details of your procedure findings, then the procedure report has been included in a sealed envelope for you to review at your convenience later.  YOU SHOULD EXPECT: Some feelings of bloating in the abdomen. Passage of more gas than usual.  Walking can help get rid of the air that was put into your GI tract during the procedure and reduce the bloating. If you had a lower endoscopy (such as a colonoscopy or flexible sigmoidoscopy) you may notice spotting of blood in your stool or on the toilet paper. If you underwent a bowel prep for your procedure, you may not have a normal bowel movement for a few days.  Please Note:  You might notice some irritation and congestion in your nose or some drainage.  This is from the oxygen used during your procedure.  There is no need for concern and it should clear up in a day or so.  SYMPTOMS TO REPORT IMMEDIATELY:    Following upper endoscopy (EGD)  Vomiting of blood or coffee ground material  New chest pain or pain under the shoulder blades  Painful or persistently difficult swallowing  New shortness of breath  Fever of 100F or higher  Black, tarry-looking stools  For urgent or emergent issues, a gastroenterologist can be reached at any hour by calling [phone]. Do not use MyChart messaging for urgent concerns.    DIET:  We do recommend a small meal at first, but then you may proceed to your regular diet.  Drink plenty of fluids but you should avoid alcoholic beverages for 24 hours.  ACTIVITY:  You should plan to take it easy for the rest of today and you should NOT DRIVE or use heavy machinery  until tomorrow (because of the sedation medicines used during the test).    FOLLOW UP: Our staff will call the number listed on your records 48-72 hours following your procedure to check on you and address any questions or concerns that you may have regarding the information given to you following your procedure. If we do not reach you, we will leave a message.  We will attempt to reach you two times.  During this call, we will ask if you have developed any symptoms of COVID 19. If you develop any symptoms (ie: fever, flu-like symptoms, shortness of breath, cough etc.) before then, please call [phone].  If you test positive for Covid 19 in the 2 weeks post procedure, please call and report this information to us.    If any biopsies were taken you will be contacted by phone or by letter within the next 1-3 weeks.  Please call us at [phone] if you have not heard about the biopsies in 3 weeks.    SIGNATURES/CONFIDENTIALITY: You and/or your care partner have signed paperwork which will be entered into your electronic medical record.  These signatures attest to the fact that that the information above on your After Visit Summary has been reviewed and is understood.  Full responsibility of the confidentiality of this discharge information lies with you and/or your care-partner. 

## 2020-11-28 NOTE — [place] Notes (Signed)
Called to room to assist during endoscopic procedure.  Patient ID and intended procedure confirmed with present staff. Received instructions for my participation in the procedure from the performing physician.  

## 2020-11-29 ENCOUNTER — Ambulatory Visit: Payer: Medicaid Other | Admitting: Physical Therapy

## 2020-11-29 DIAGNOSIS — R279 Unspecified lack of coordination: Secondary | ICD-10-CM

## 2020-11-29 DIAGNOSIS — M6281 Muscle weakness (generalized): Secondary | ICD-10-CM | POA: Diagnosis not present

## 2020-11-29 DIAGNOSIS — R293 Abnormal posture: Secondary | ICD-10-CM

## 2020-11-29 NOTE — Patient Instructions (Signed)
Access Code: [email or web address] URL: [email or web address] Date: 11/29/2020 Prepared by: Jari Favre  Exercises Supine Transversus Abdominis Bracing with Heel Slide - 1 x daily - 7 x weekly - 2 sets - 10 reps Quadruped Alternating Arm Lift - 1 x daily - 7 x weekly - 2 sets - 10 reps Quadruped Alternating Leg Extensions - 1 x daily - 7 x weekly - 3 sets - 10 reps

## 2020-11-29 NOTE — Therapy (Signed)
[facility] [address] [place], [place], [address] Phone: [phone]   Fax:  [phone]  Physical Therapy Treatment  Patient Details  Name: Wallace Cogliano MRN: [id number] Date of Birth: 02-11-1991 Referring Provider (PT): Martyn Malay, MD   Encounter Date: 11/29/2020   PT End of Session - 11/29/20 1014    Visit Number 2    Date for PT Re-Evaluation 02/15/21    Authorization Type mcaid prep healthy blue    PT Start Time 0940   arrived late   PT Stop Time 1006    PT Time Calculation (min) 26 min    Activity Tolerance Patient tolerated treatment well    Behavior During Therapy [facility] for tasks assessed/performed           Past Medical History:  Diagnosis Date  . Anemia   . Anxiety   . GERD (gastroesophageal reflux disease)   . Hypothyroidism     Past Surgical History:  Procedure Laterality Date  . ESOPHAGEAL DILATION      There were no vitals filed for this visit.   Subjective Assessment - 11/29/20 1058    Subjective Pt states she used the toileting techniques, still SUI with sneezing etc; DRA and Lt SIJ pain.    Currently in Pain? No/denies                             [facility] Adult PT Treatment/Exercise - 11/29/20 0001      Self-Care   Other Self-Care Comments  urge techniques      Neuro Re-ed    Neuro Re-ed Details  TrA and oblique activition - tactile cues and cue to take break when needed      Lumbar Exercises: Supine   Heel Slides 15 reps    Bent Knee Raise 15 reps    Other Supine Lumbar Exercises qped UE and LE reaches 5x each      Manual Therapy   Manual Therapy Myofascial release    Myofascial Release lower mesentary release, bilat colon release and central fascia to rectum                  PT Education - 11/29/20 1032    Education Details Access Code: [email or web address] and urge technique    Person(s) Educated Patient    Methods  Explanation;Demonstration;Handout;Verbal cues    Comprehension Verbalized understanding;Returned demonstration            PT Short Term Goals - 11/23/20 0948      PT SHORT TERM GOAL #1   Title ind with urge and toilet techniques    Time 4    Period Weeks    Status New    Target Date 12/21/20      PT SHORT TERM GOAL #2   Title ind with initial HEP for core/TrA strength    Time 4    Period Weeks    Status New    Target Date 12/21/20             PT Long Term Goals - 11/23/20 0952      PT LONG TERM GOAL #1   Title Ind with advanced HEP for return to exercise without back pain    Time 12    Period Weeks    Status New    Target Date 02/15/21      PT LONG TERM GOAL #2   Title pt will report able  to avoid leakage with coughing and sneezing at least 80% of the time    Time 12    Period Weeks    Status New    Target Date 02/15/21      PT LONG TERM GOAL #3   Title Pt will report 50% easier time having a BM due to improved core strength creating adequate intra-abdominal pressure    Time 12    Period Weeks    Status New    Target Date 02/15/21      PT LONG TERM GOAL #4   Title pt will report at least 75% less back pain during a typical day    Time 12    Period Weeks    Status New    Target Date 02/15/21                 Plan - 11/29/20 1018    Clinical Impression Statement Pt did well with initial HEP and doing TrA.  Able to layer oblique activation onto TrA and no difficulty with breath holding.  Pt was given education on urge technique and responded well to fascial release.  No  progress made towards goals due to first visit today    PT Treatment/Interventions ADLs/Self Care Home Management;Cryotherapy;Neuromuscular re-education;Manual techniques;Patient/family education;Therapeutic exercise;Therapeutic activities;Dry needling;Passive range of motion;Taping    PT Next Visit Plan f/u on urge and intitial HEPposture with arch stab and core activated squats     PT Home Exercise Plan urge and toilet; Access Code: [email or web address]    Consulted and Agree with Plan of Care Patient           Patient will benefit from skilled therapeutic intervention in order to improve the following deficits and impairments:  Pain,Postural dysfunction,Decreased strength,Increased muscle spasms,Increased fascial restricitons,Decreased coordination,Decreased range of motion,Impaired flexibility  Visit Diagnosis: Muscle weakness (generalized)  Unspecified lack of coordination  Abnormal posture     Problem List Patient Active Problem List   Diagnosis Date Noted  . Dysphagia 11/14/2020  . Pelvic floor dysfunction 10/02/2020  . Varicose veins of legs, antepartum 12/19/2019  . Plantar wart, right foot 03/18/2018  . GERD (gastroesophageal reflux disease) 07/25/2016  . Tinea pedis of both feet 07/25/2016  . Hypothyroidism 07/19/2015  . 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  . Chronic UTI 10/24/2013  . IBS (irritable bowel syndrome) 06/29/2012    Jule Ser, PT 11/29/2020, 11:05 AM  [facility] [address], [place], [place], [address] Phone: [phone]   Fax:  [phone]  Name: Ayza Ripoll MRN: [id number] Date of Birth: [birth year]

## 2020-11-30 ENCOUNTER — Telehealth: Payer: Self-pay

## 2020-11-30 NOTE — Telephone Encounter (Signed)
  Follow up Call-  Call back number 11/28/2020  Post procedure Call Back phone  # [phone]  Permission to leave phone message Yes  Some recent data might be hidden     Patient questions:  Do you have a fever, pain , or abdominal swelling? No. Pain Score  0 *  Have you tolerated food without any problems? Yes.    Have you been able to return to your normal activities? Yes.    Do you have any questions about your discharge instructions: Diet   No. Medications  No. Follow up visit  No.  Do you have questions or concerns about your Care? No.  Actions: * If pain score is 4 or above: No action needed, pain <4.  1. Have you developed a fever since your procedure? No  2.   Have you had an respiratory symptoms (SOB or cough) since your procedure? No 3.   Have you tested positive for COVID 19 since your procedure No  4.   Have you had any family members/close contacts diagnosed with the COVID 19 since your procedure?  No   If yes to any of these questions please route to Joylene John, RN and Joella Prince, RN

## 2020-12-06 ENCOUNTER — Other Ambulatory Visit: Payer: Self-pay

## 2020-12-06 ENCOUNTER — Encounter: Payer: Self-pay | Admitting: Physical Therapy

## 2020-12-06 ENCOUNTER — Ambulatory Visit: Payer: Medicaid Other | Admitting: Physical Therapy

## 2020-12-06 DIAGNOSIS — R279 Unspecified lack of coordination: Secondary | ICD-10-CM

## 2020-12-06 DIAGNOSIS — M6281 Muscle weakness (generalized): Secondary | ICD-10-CM

## 2020-12-06 DIAGNOSIS — R293 Abnormal posture: Secondary | ICD-10-CM

## 2020-12-06 NOTE — Patient Instructions (Signed)
Access Code: [email or web address] URL: [email or web address] Date: 12/06/2020 Prepared by: Jari Favre  Exercises Supine Transversus Abdominis Bracing with Heel Slide - 1 x daily - 7 x weekly - 2 sets - 10 reps Quadruped Alternating Arm Lift - 1 x daily - 7 x weekly - 2 sets - 10 reps Quadruped Alternating Leg Extensions - 1 x daily - 7 x weekly - 3 sets - 10 reps Standing Transverse Abdominis Contraction - 1 x daily - 7 x weekly - 3 sets - 10 reps Standing Shoulder Flexion with Resistance - 1 x daily - 7 x weekly - 3 sets - 10 reps

## 2020-12-07 NOTE — Therapy (Signed)
[facility] [address], [place], [place], [address] Phone: [phone]   Fax:  [phone]  Physical Therapy Treatment  Patient Details  Name: Olivia Shepard MRN: [id number] Date of Birth: 04-20-1991 Referring Provider (PT): Martyn Malay, MD   Encounter Date: 12/06/2020   PT End of Session - 12/06/20 1149    Visit Number 3    Date for PT Re-Evaluation 02/15/21    Authorization Type mcaid prep healthy blue    PT Start Time 1147    PT Stop Time 1226    PT Time Calculation (min) 39 min    Activity Tolerance Patient tolerated treatment well    Behavior During Therapy [facility] for tasks assessed/performed           Past Medical History:  Diagnosis Date  . Anemia   . Anxiety   . GERD (gastroesophageal reflux disease)   . Hypothyroidism     Past Surgical History:  Procedure Laterality Date  . ESOPHAGEAL DILATION      There were no vitals filed for this visit.   Subjective Assessment - 12/07/20 0915    Subjective I am feeling stronger in the core.  My Lt hip still hurts especially at night lying in bed is the worst    Patient Stated Goals reduce pain    Currently in Pain? No/denies                             [facility] Adult PT Treatment/Exercise - 12/07/20 0001      Self-Care   Other Self-Care Comments  self massage with tennis ball - glutes      Lumbar Exercises: Standing   Functional Squats 10 reps    Functional Squats Limitations no weight and exhale as coming up; lifting baby and exhale    Lifting From waist;10 reps    Lifting Limitations lift 4lb in each hand up to shoulder height- exhale with lift      Lumbar Exercises: Supine   Other Supine Lumbar Exercises hip flexor stretch in supine LE off table - 30 sec      Manual Therapy   Manual Therapy Soft tissue mobilization    Soft tissue mobilization Lt gluteals and lumbar paraspinals                  PT Education -  12/06/20 1230    Education Details Access Code: [email or web address]    Person(s) Educated Patient    Methods Explanation;Demonstration;Tactile cues;Verbal cues;Handout    Comprehension Verbalized understanding;Returned demonstration            PT Short Term Goals - 12/06/20 1150      PT SHORT TERM GOAL #1   Title ind with urge and toilet techniques    Status Achieved      PT SHORT TERM GOAL #2   Title ind with initial HEP for core/TrA strength    Status Achieved             PT Long Term Goals - 11/23/20 2947      PT LONG TERM GOAL #1   Title Ind with advanced HEP for return to exercise without back pain    Time 12    Period Weeks    Status New    Target Date 02/15/21      PT LONG TERM GOAL #2   Title pt will report able to avoid leakage with coughing and sneezing  at least 80% of the time    Time 71    Period Weeks    Status New    Target Date 02/15/21      PT LONG TERM GOAL #3   Title Pt will report 50% easier time having a BM due to improved core strength creating adequate intra-abdominal pressure    Time 12    Period Weeks    Status New    Target Date 02/15/21      PT LONG TERM GOAL #4   Title pt will report at least 75% less back pain during a typical day    Time 12    Period Weeks    Status New    Target Date 02/15/21                 Plan - 12/07/20 0917    Clinical Impression Statement Today's session focused on transverse abdominus engaged in different positions and during functional movements.  Pt did well with cues to exhale to activate the transverse abd and then able to maintain tension and still breathing with movements.  Pt was abel to feel more muscle activation and less pain when standing and holding her son with cues to unlock knees.  Pt also has tension in Lt gluteals and glute medius in particular.  Pt was educated on stretches to reduce tension and self massage with tennis ball.  Pt will benefit from skilled PT to continue to address core  strength and STM as needed to lengthen muscle and reduce spasms.    PT Treatment/Interventions ADLs/Self Care Home Management;Cryotherapy;Neuromuscular re-education;Manual techniques;Patient/family education;Therapeutic exercise;Therapeutic activities;Dry needling;Passive range of motion;Taping    PT Next Visit Plan f/u on Lt hip/SI pain; progress squats add hip hinge with flat back; pidgeon pose    PT Home Exercise Plan urge and toilet; Access Code: [email or web address]    Consulted and Agree with Plan of Care Patient           Patient will benefit from skilled therapeutic intervention in order to improve the following deficits and impairments:  Pain,Postural dysfunction,Decreased strength,Increased muscle spasms,Increased fascial restricitons,Decreased coordination,Decreased range of motion,Impaired flexibility  Visit Diagnosis: Muscle weakness (generalized)  Unspecified lack of coordination  Abnormal posture     Problem List Patient Active Problem List   Diagnosis Date Noted  . Dysphagia 11/14/2020  . Pelvic floor dysfunction 10/02/2020  . Varicose veins of legs, antepartum 12/19/2019  . Plantar wart, right foot 03/18/2018  . GERD (gastroesophageal reflux disease) 07/25/2016  . Tinea pedis of both feet 07/25/2016  . Hypothyroidism 07/19/2015  . 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  . Chronic UTI 10/24/2013  . IBS (irritable bowel syndrome) 06/29/2012    Jule Ser, PT 12/07/2020, 9:27 AM  [facility] [address] [place], [place], [address] Phone: [phone]   Fax:  [phone]  Name: Calianne Larue MRN: [id number] Date of Birth: January 26, 1991

## 2020-12-25 ENCOUNTER — Ambulatory Visit: Payer: Medicaid Other | Attending: Internal Medicine

## 2020-12-25 ENCOUNTER — Other Ambulatory Visit (HOSPITAL_COMMUNITY): Payer: Self-pay | Admitting: Internal Medicine

## 2020-12-25 DIAGNOSIS — Z23 Encounter for immunization: Secondary | ICD-10-CM

## 2020-12-25 NOTE — Progress Notes (Signed)
   Covid-19 Vaccination Clinic  Name:  Olivia Shepard    MRN: [id number] DOB: 08/18/1991  12/25/2020  [name] was observed post Covid-19 immunization for 15 minutes without incident. She was provided with Vaccine Information Sheet and instruction to access the V-Safe system.   [name] was instructed to call 911 with any severe reactions post vaccine: Marland Kitchen Difficulty breathing  . Swelling of face and throat  . A fast heartbeat  . A bad rash all over body  . Dizziness and weakness   Immunizations Administered    Name Date Dose VIS Date Route   PFIZER Comrnaty(Gray TOP) Covid-19 Vaccine 12/25/2020 11:43 AM 0.3 mL 10/18/2020 Intramuscular   Manufacturer: [place]   Lot: [id number]   NDC: [phone]

## 2020-12-28 ENCOUNTER — Telehealth: Payer: Self-pay

## 2020-12-28 NOTE — Telephone Encounter (Signed)
Attempted to call X1 to discuss symptoms.

## 2020-12-28 NOTE — Telephone Encounter (Signed)
Patient calls nurse line regarding side effects from booster vaccine that was received on 2/15. Patient reports severe headache, fever (unknown tmax), chills, pain and swelling under arm and into breast for the first two days.   Headache, nausea and body aches, pain in arm and into breast is still persistent.   Spoke with Dr. McDiarmid due to the length of side effects. Advised that patient continue supportive care and will call office back on Monday if no improvement. Informed patient. Patient verbalizes understanding.   FYI to PCP  Talbot Grumbling, RN

## 2020-12-30 NOTE — Telephone Encounter (Signed)
Called and spoke with patient.  Vaccine on 2/15---started early in PM and throughout 2/16.   Ongoing headaches that are slightly improved and other symptoms resolved.  Recommended follow up Tuesday if not improved.  Dorris Singh, MD  Family Medicine Teaching Service

## 2021-01-03 ENCOUNTER — Encounter: Payer: Self-pay | Admitting: Physical Therapy

## 2021-01-03 ENCOUNTER — Ambulatory Visit: Payer: Medicaid Other | Attending: Family Medicine | Admitting: Physical Therapy

## 2021-01-03 ENCOUNTER — Other Ambulatory Visit: Payer: Self-pay

## 2021-01-03 DIAGNOSIS — R279 Unspecified lack of coordination: Secondary | ICD-10-CM | POA: Diagnosis present

## 2021-01-03 DIAGNOSIS — M6281 Muscle weakness (generalized): Secondary | ICD-10-CM

## 2021-01-03 DIAGNOSIS — R293 Abnormal posture: Secondary | ICD-10-CM | POA: Insufficient documentation

## 2021-01-03 NOTE — Therapy (Signed)
[facility] [address] [place], [place], [address] Phone: [phone]   Fax:  [phone]  Physical Therapy Treatment  Patient Details  Name: Olivia Shepard MRN: [id number] Date of Birth: 1991/10/31 Referring Provider (PT): Martyn Malay, MD   Encounter Date: 01/03/2021   PT End of Session - 01/03/21 1502    Visit Number 4    Date for PT Re-Evaluation 02/15/21    Authorization Type mcaid prep healthy blue    PT Start Time 1451    PT Stop Time 1533    PT Time Calculation (min) 42 min    Activity Tolerance Patient tolerated treatment well    Behavior During Therapy [facility] for tasks assessed/performed           Past Medical History:  Diagnosis Date  . Anemia   . Anxiety   . GERD (gastroesophageal reflux disease)   . Hypothyroidism     Past Surgical History:  Procedure Laterality Date  . ESOPHAGEAL DILATION      There were no vitals filed for this visit.   Subjective Assessment - 01/03/21 1452    Subjective Pt states she is having hip pain on the left side still and that is the same.  Pt states the abdominal muscles feel stronger    Limitations Lifting    Patient Stated Goals reduce pain    Currently in Pain? Yes    Pain Score 1    5/10 when carrying kids   Pain Location Hip    Pain Orientation Left                             [facility] Adult PT Treatment/Exercise - 01/03/21 0001      Exercises   Exercises Other Exercises    Other Exercises  hip hinge bilat and staggared stance; lateral shift left. child pose with side bend; pidgeon pose; lean on table with kegel and core activation, qped with kegel and feet out      Manual Therapy   Soft tissue mobilization Lt glutes and Rt QL                  PT Education - 01/03/21 1718    Education Details updates to [email or web address]    Person(s) Educated Patient    Methods Explanation;Demonstration;Verbal cues;Handout;Tactile cues     Comprehension Verbalized understanding;Returned demonstration            PT Short Term Goals - 12/06/20 1150      PT SHORT TERM GOAL #1   Title ind with urge and toilet techniques    Status Achieved      PT SHORT TERM GOAL #2   Title ind with initial HEP for core/TrA strength    Status Achieved             PT Long Term Goals - 01/03/21 1509      PT LONG TERM GOAL #2   Title pt will report able to avoid leakage with coughing and sneezing at least 80% of the time    Baseline haven't been coughing      PT LONG TERM GOAL #3   Title Pt will report 50% easier time having a BM due to improved core strength creating adequate intra-abdominal pressure    Status On-going                 Plan - 01/03/21 1717    Clinical  Impression Statement Today's session focused on functional hip hinge with improved posture. Pt has lateral shift to the side from holding infants to the Rt side more than Left.  pt still has leakage with quick movemnts and added basic kegel with core strengthening today.  Pt is feeling like the core is getting a little stronger and she will benefit from skilled PT to continue to progress impairments for full return to funcitonal activities without pain.    Examination-Activity Limitations Bend;Caring for Others;Carry;Lift;Toileting;Continence    PT Treatment/Interventions ADLs/Self Care Home Management;Cryotherapy;Neuromuscular re-education;Manual techniques;Patient/family education;Therapeutic exercise;Therapeutic activities;Dry needling;Passive range of motion;Taping    PT Next Visit Plan lumbar and thoracic sidebend and rotation; core and glute strength; f/u on adding kegel; discuss kegel weights; lateral movements    PT Home Exercise Plan urge and toilet; Access Code: [email or web address]    Consulted and Agree with Plan of Care Patient           Patient will benefit from skilled therapeutic intervention in order to improve the following deficits and impairments:   Pain,Postural dysfunction,Decreased strength,Increased muscle spasms,Increased fascial restricitons,Decreased coordination,Decreased range of motion,Impaired flexibility  Visit Diagnosis: Muscle weakness (generalized)  Unspecified lack of coordination  Abnormal posture     Problem List Patient Active Problem List   Diagnosis Date Noted  . Dysphagia 11/14/2020  . Pelvic floor dysfunction 10/02/2020  . Varicose veins of legs, antepartum 12/19/2019  . Plantar wart, right foot 03/18/2018  . GERD (gastroesophageal reflux disease) 07/25/2016  . Tinea pedis of both feet 07/25/2016  . Hypothyroidism 07/19/2015  . 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  . Chronic UTI 10/24/2013  . IBS (irritable bowel syndrome) 06/29/2012    Jule Ser, PT 01/03/2021, 5:23 PM  [facility] [address], [place], [place], [address] Phone: [phone]   Fax:  [phone]  Name: Shron Ozer MRN: [id number] Date of Birth: Jul 14, 1991

## 2021-01-08 ENCOUNTER — Ambulatory Visit: Payer: Medicaid Other | Admitting: Physical Therapy

## 2021-01-10 ENCOUNTER — Other Ambulatory Visit: Payer: Self-pay

## 2021-01-10 ENCOUNTER — Ambulatory Visit: Payer: Medicaid Other | Attending: Family Medicine | Admitting: Physical Therapy

## 2021-01-10 DIAGNOSIS — M6281 Muscle weakness (generalized): Secondary | ICD-10-CM | POA: Insufficient documentation

## 2021-01-10 DIAGNOSIS — R279 Unspecified lack of coordination: Secondary | ICD-10-CM | POA: Diagnosis present

## 2021-01-10 DIAGNOSIS — R293 Abnormal posture: Secondary | ICD-10-CM | POA: Diagnosis present

## 2021-01-10 NOTE — Therapy (Signed)
[facility] [address] [place], [place], [address] Phone: [phone]   Fax:  [phone]  Physical Therapy Treatment  Patient Details  Name: Olivia Shepard MRN: [id number] Date of Birth: [birth year] Referring Provider (PT): Martyn Malay, MD   Encounter Date: 01/10/2021   PT End of Session - 01/10/21 1653    Visit Number 5    Date for PT Re-Evaluation 04/04/21    Authorization Type mcaid prep healthy blue    PT Start Time 1619    PT Stop Time 1648    PT Time Calculation (min) 29 min    Activity Tolerance Patient tolerated treatment well    Behavior During Therapy [facility] for tasks assessed/performed           Past Medical History:  Diagnosis Date  . Anemia   . Anxiety   . GERD (gastroesophageal reflux disease)   . Hypothyroidism     Past Surgical History:  Procedure Laterality Date  . ESOPHAGEAL DILATION      There were no vitals filed for this visit.   Subjective Assessment - 01/11/21 1132    Subjective Pt reports she is feeling stronger.  Pt still has a little leakage when sneezing but much less.  Pt cannot do her normal activities such as zumba.  Pain is intermittent    Patient Stated Goals reduce pain, return to zumba    Currently in Pain? No/denies              [facility] PT Assessment - 01/11/21 0001      Assessment   Medical Diagnosis M62.08 (ICD-10-CM) - Diastasis recti;M62.89 (ICD-10-CM) - Pelvic floor dysfunction    Referring Provider (PT) Martyn Malay, MD      Posture/Postural Control   Posture Comments Rt weight shift and rotation in thoracic      Palpation   Palpation comment Lt SI joint TTP; no tenting or diastasis other than <1 finger just around umbilicus                         [facility] Adult PT Treatment/Exercise - 01/11/21 0001      Self-Care   Other Self-Care Comments  info and educated on kegel weights      Lumbar Exercises: Standing   Functional Squats 10  reps    Functional Squats Limitations holding her son; minimal TC to work on shifting weight evenly into the Lt LE      Lumbar Exercises: Supine   Dead Bug Limitations LE up and tapping down; LE up and UE overhead;                    PT Short Term Goals - 12/06/20 1150      PT SHORT TERM GOAL #1   Title ind with urge and toilet techniques    Status Achieved      PT SHORT TERM GOAL #2   Title ind with initial HEP for core/TrA strength    Status Achieved             PT Long Term Goals - 01/10/21 1629      PT LONG TERM GOAL #1   Title Ind with advanced HEP for return to exercise without back pain    Time 8    Period Weeks    Status On-going    Target Date 03/07/21      PT LONG TERM GOAL #2   Title  pt will report able to avoid leakage with coughing and sneezing at least 80% of the time    Baseline sneezed and had leakage but was smaller amount    Time 8    Period Weeks    Status Partially Met    Target Date 03/07/21      PT LONG TERM GOAL #3   Title Pt will report 50% easier time having a BM due to improved core strength creating adequate intra-abdominal pressure    Time 8    Period Weeks    Status On-going    Target Date 03/07/21      PT LONG TERM GOAL #4   Title pt will report at least 75% less back pain during a typical day    Baseline 50% better    Time 8    Period Weeks    Status Partially Met    Target Date 03/07/21      PT LONG TERM GOAL #5   Title Pt will be able to resume taking zumba classes without the need for modifications    Time 8    Period Weeks    Status New    Target Date 03/07/21                 Plan - 01/11/21 1134    Clinical Impression Statement Today's session was re-assessment of goals and progress and reviewing current exercises . Pt did well with squas and only minimal posture corrections needed.  Pt continues to have some lumbar and thoracic leaning and rotation to the Lt, but is not as pronounced as it was  previously.  Pt has improved diastasis and has made excellent progress. She will benefit from skilled PT to continue to address and progress strength, coordination and posture for fully return to functional activities and goals as stated    Personal Factors and Comorbidities Time since onset of injury/illness/exacerbation;Comorbidity 2    Comorbidities 4 vaginal deliveries; 2nd degree tears    Examination-Activity Limitations Bend;Caring for Others;Carry;Lift;Toileting;Continence    Examination-Participation Restrictions Community Activity    Rehab Potential Excellent    PT Frequency 2x / week    PT Duration 8 weeks    PT Treatment/Interventions ADLs/Self Care Home Management;Cryotherapy;Neuromuscular re-education;Manual techniques;Patient/family education;Therapeutic exercise;Therapeutic activities;Dry needling;Passive range of motion;Taping    PT Next Visit Plan lumbar and thoracic sidebend and rotation; pelvic, core and glute strength; f/u on kegel weights    PT Home Exercise Plan urge and toilet; Access Code: [email or web address]    Consulted and Agree with Plan of Care Patient           Patient will benefit from skilled therapeutic intervention in order to improve the following deficits and impairments:  Pain,Postural dysfunction,Decreased strength,Increased muscle spasms,Increased fascial restricitons,Decreased coordination,Decreased range of motion,Impaired flexibility  Visit Diagnosis: Muscle weakness (generalized)  Unspecified lack of coordination  Abnormal posture     Problem List Patient Active Problem List   Diagnosis Date Noted  . Dysphagia 11/14/2020  . Pelvic floor dysfunction 10/02/2020  . Varicose veins of legs, antepartum 12/19/2019  . Plantar wart, right foot 03/18/2018  . GERD (gastroesophageal reflux disease) 07/25/2016  . Tinea pedis of both feet 07/25/2016  . Hypothyroidism 07/19/2015  . 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  . Chronic UTI 10/24/2013  . IBS  (irritable bowel syndrome) 06/29/2012    Jule Ser, PT 01/11/2021, 11:57 AM  [facility] [address], [place], [place], [address] Phone:  [phone]   Fax:  [phone]  Name: Wanette Robison MRN: [id number] Date of Birth: Apr 21, 1991

## 2021-01-11 ENCOUNTER — Encounter: Payer: Self-pay | Admitting: Physical Therapy

## 2021-02-07 ENCOUNTER — Ambulatory Visit: Payer: Medicaid Other | Admitting: Physical Therapy

## 2021-02-07 DIAGNOSIS — R279 Unspecified lack of coordination: Secondary | ICD-10-CM

## 2021-02-07 DIAGNOSIS — M6281 Muscle weakness (generalized): Secondary | ICD-10-CM

## 2021-02-07 DIAGNOSIS — R293 Abnormal posture: Secondary | ICD-10-CM

## 2021-02-08 ENCOUNTER — Other Ambulatory Visit: Payer: Self-pay

## 2021-02-08 ENCOUNTER — Encounter: Payer: Self-pay | Admitting: Physical Therapy

## 2021-02-08 NOTE — Therapy (Addendum)
[facility] [address] [place], [place], [address] Phone: [phone]   Fax:  [phone]  Physical Therapy Treatment  Patient Details  Name: Apoorva Bugay MRN: [id number] Date of Birth: Feb 09, 1991 Referring Provider (PT): Martyn Malay, MD   Encounter Date: 02/07/2021   PT End of Session - 02/08/21 0829    Visit Number 6    Date for PT Re-Evaluation 04/04/21    Authorization Type mcaid prep healthy blue    PT Start Time 0349    PT Stop Time 1622    PT Time Calculation (min) 33 min    Activity Tolerance Patient tolerated treatment well    Behavior During Therapy [facility] for tasks assessed/performed           Past Medical History:  Diagnosis Date  . Anemia   . Anxiety   . GERD (gastroesophageal reflux disease)   . Hypothyroidism     Past Surgical History:  Procedure Laterality Date  . ESOPHAGEAL DILATION      There were no vitals filed for this visit.   Subjective Assessment - 02/08/21 0755    Subjective Pt states pain is better and not leaking unless doing long walk with her baby pressed on front of her body    Currently in Pain? No/denies                             [facility] Adult PT Treatment/Exercise - 02/08/21 0001      Exercises   Exercises Other Exercises    Other Exercises  jumping progression, half kneel iwth chop, extensive review of HEP; hallow holds for core strength, planks front and side, self massage to abdomen                    PT Short Term Goals - 12/06/20 1150      PT SHORT TERM GOAL #1   Title ind with urge and toilet techniques    Status Achieved      PT SHORT TERM GOAL #2   Title ind with initial HEP for core/TrA strength    Status Achieved             PT Long Term Goals - 02/08/21 0905      PT LONG TERM GOAL #1   Title Ind with advanced HEP for return to exercise without back pain    Status Achieved      PT LONG TERM GOAL #2    Title pt will report able to avoid leakage with coughing and sneezing at least 80% of the time    Baseline no leakage lately unless baby on front of belly with full bladder    Status Achieved      PT LONG TERM GOAL #3   Title Pt will report 50% easier time having a BM due to improved core strength creating adequate intra-abdominal pressure    Baseline still has "loop" that falls when constipated    Status Partially Met      PT LONG TERM GOAL #4   Title pt will report at least 75% less back pain during a typical day    Baseline no pain    Status Achieved      PT LONG TERM GOAL #5   Title Pt will be able to resume taking zumba classes without the need for modifications    Baseline not progressed to this yet  Status Not Met                 Plan - 02/08/21 0856    Clinical Impression Statement Pt did well with exercises today.  She is ind with HEP and today's session did extensive review of HEP and how to progress.  Pt is recommended to d/c with HEP today.    PT Treatment/Interventions ADLs/Self Care Home Management;Cryotherapy;Neuromuscular re-education;Manual techniques;Patient/family education;Therapeutic exercise;Therapeutic activities;Dry needling;Passive range of motion;Taping    PT Next Visit Plan d/c today    PT Home Exercise Plan urge and toilet; Access Code: [email or web address]    Consulted and Agree with Plan of Care Patient           Patient will benefit from skilled therapeutic intervention in order to improve the following deficits and impairments:  Pain,Postural dysfunction,Decreased strength,Increased muscle spasms,Increased fascial restricitons,Decreased coordination,Decreased range of motion,Impaired flexibility  Visit Diagnosis: Muscle weakness (generalized)  Unspecified lack of coordination  Abnormal posture     Problem List Patient Active Problem List   Diagnosis Date Noted  . Dysphagia 11/14/2020  . Pelvic floor dysfunction 10/02/2020  . Varicose veins  of legs, antepartum 12/19/2019  . Plantar wart, right foot 03/18/2018  . GERD (gastroesophageal reflux disease) 07/25/2016  . Tinea pedis of both feet 07/25/2016  . Hypothyroidism 07/19/2015  . 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  . Chronic UTI 10/24/2013  . IBS (irritable bowel syndrome) 06/29/2012    Jule Ser, PT 02/08/2021, 9:13 AM  [facility] [address] [place], [place], [address] Phone: [phone]   Fax:  [phone]  Name: Eunique Balik MRN: [id number] Date of Birth: [birth year]  PHYSICAL THERAPY DISCHARGE SUMMARY  Visits from Start of Care: 6  Current functional level related to goals / functional outcomes: See above goals   Remaining deficits: See above details   Education / Equipment: HEP  Plan: Patient agrees to discharge.  Patient goals were met. Patient is being discharged due to meeting the stated rehab goals.  ?????    Almost all goals met and doing great, still issue with constipation and rectocele unresolved with pT  Gustavus Bryant, PT 02/08/21 9:28 AM

## 2021-02-21 ENCOUNTER — Ambulatory Visit: Payer: Medicaid Other | Admitting: Physical Therapy

## 2021-06-12 ENCOUNTER — Encounter: Payer: Self-pay | Admitting: Family Medicine

## 2021-06-17 ENCOUNTER — Telehealth: Payer: Self-pay | Admitting: Family Medicine

## 2021-06-17 NOTE — Telephone Encounter (Signed)
Reviewed, completed, and signed form.  Note routed to RN team inbasket and placed completed form in Clinic RN's office (wall pocket above desk). Please call patient and let her know it is ready.   Martyn Malay, MD

## 2021-06-17 NOTE — Telephone Encounter (Signed)
Patient called and informed that forms are ready for pick up. Copy made and placed in batch scanning. Original placed at front desk for pick up.   Yadier Bramhall C Tao Satz, RN  

## 2021-07-01 ENCOUNTER — Ambulatory Visit: Payer: Medicaid Other | Admitting: Family Medicine

## 2021-08-02 ENCOUNTER — Other Ambulatory Visit: Payer: Self-pay

## 2021-08-02 ENCOUNTER — Ambulatory Visit: Payer: Medicaid Other | Admitting: Family Medicine

## 2021-08-02 ENCOUNTER — Encounter: Payer: Self-pay | Admitting: Family Medicine

## 2021-08-02 VITALS — BP 116/89 | HR 89 | Wt 141.0 lb

## 2021-08-02 DIAGNOSIS — R5383 Other fatigue: Secondary | ICD-10-CM | POA: Insufficient documentation

## 2021-08-02 DIAGNOSIS — Z23 Encounter for immunization: Secondary | ICD-10-CM | POA: Diagnosis not present

## 2021-08-02 DIAGNOSIS — E039 Hypothyroidism, unspecified: Secondary | ICD-10-CM | POA: Diagnosis not present

## 2021-08-02 NOTE — Patient Instructions (Signed)
Thank you for coming to see me today. It was a pleasure. Today we discussed your energy levels. It could be due to anemia, your low thyroid levels or also being a busy mother of 4! We will get some labs today.  If they are abnormal or we need to do something about them, I will call you.  If they are normal, I will send you a message on MyChart (if it is active) or a letter in the mail.  If you don't hear from [place] in 2 weeks, please call the office at the number below.   Let us know if you need anything for anxiety. You can restart the zoloft or we can give you PRN hydroxyzine.   Please follow-up with your PCP as needed.   If you have any questions or concerns, please do not hesitate to call the office at [phone].  Best wishes,   Dr Posey Pronto

## 2021-08-02 NOTE — Assessment & Plan Note (Addendum)
Pt reports worsening of fatigue over the last few months. Could be multifactorial: thyroid disease, possible anemia (pt has a hx of this) and also she is a busy mother of 4 with other foster children too which is likely contributing. She denies menorrhagia. Considered mood disorder such as anxiety/depression. Pt reports she no longer has anxiety and this was mostly during and after her pregnancies. Also considered OSA however less likely given body habitus. Obtained CBC, CMP, anemia panel and TSH today.

## 2021-08-02 NOTE — Assessment & Plan Note (Signed)
Rechecked TSH today. Will adjust Synthroid dose according to TSH.

## 2021-08-02 NOTE — Progress Notes (Signed)
     SUBJECTIVE:   CHIEF COMPLAINT / HPI:   Olivia Shepard is a 30 y.o. female presents for fatigue  Fatigue Pt takes synthroid 60mcg daily since 2017. She has been feeling very tired recently and not sleeping well. Denies fevers, weight loss, night sweats. Has been anemic in the past with all her pregnancies. She took iron tablets in the past and no longer takes them. Now she takes supplement which has iron in it.   LMP 07/23/21, regular cycles. They are heavier initially then they become light. Denies passing clots. Not on birth control.   [facility] Office Visit from 08/02/2021 in [place]  PHQ-9 Total Score 1       PERTINENT  PMH / PSH: GERD, IBS, hypothyroidism  OBJECTIVE:   BP 116/89   Pulse 89   Wt 141 lb (64 kg)   LMP 07/24/2021   Breastfeeding Yes   BMI 22.08 kg/m    General: Alert, no acute distress, pleasant, well appearing  Cardio: Normal S1 and S2, RRR, no r/m/g Pulm: CTAB, normal work of breathing Abdomen: Bowel sounds normal. Abdomen soft and non-tender.  Extremities: No peripheral edema.  Neuro: Cranial nerves grossly intact   ASSESSMENT/PLAN:   Hypothyroidism Rechecked TSH today. Will adjust Synthroid dose according to TSH.   Fatigue Pt reports worsening of fatigue over the last few months. Could be multifactorial: thyroid disease, possible anemia (pt has a hx of this) and also she is a busy mother of 4 with other foster children too which is likely contributing. She denies menorrhagia. Considered mood disorder such as anxiety/depression. Pt reports she no longer has anxiety and this was mostly during and after her pregnancies. Also considered OSA however less likely given body habitus. Obtained CBC, CMP, anemia panel and TSH today.     Lattie Haw, MD PGY-3 [place]

## 2021-08-03 ENCOUNTER — Encounter: Payer: Self-pay | Admitting: Family Medicine

## 2021-08-04 LAB — COMPREHENSIVE METABOLIC PANEL
ALT: 12 IU/L (ref 0–32)
AST: 11 IU/L (ref 0–40)
Albumin/Globulin Ratio: 2.1 (ref 1.2–2.2)
Albumin: 4.8 g/dL (ref 3.8–4.8)
Alkaline Phosphatase: 62 IU/L (ref 44–121)
BUN/Creatinine Ratio: 27 — ABNORMAL HIGH (ref 9–23)
BUN: 18 mg/dL (ref 6–20)
Bilirubin Total: 0.3 mg/dL (ref 0.0–1.2)
CO2: 26 mmol/L (ref 20–29)
Calcium: 10 mg/dL (ref 8.7–10.2)
Chloride: 103 mmol/L (ref 96–106)
Creatinine, Ser: 0.67 mg/dL (ref 0.57–1.00)
Globulin, Total: 2.3 g/dL (ref 1.5–4.5)
Glucose: 68 mg/dL (ref 65–99)
Potassium: 5.1 mmol/L (ref 3.5–5.2)
Sodium: 143 mmol/L (ref 134–144)
Total Protein: 7.1 g/dL (ref 6.0–8.5)
eGFR: 120 mL/min/{1.73_m2} (ref >59)

## 2021-08-04 LAB — ANEMIA PANEL
Ferritin: 75 ng/mL (ref 15–150)
Folate, Hemolysate: 444 ng/mL
Folate, RBC: 1147 ng/mL (ref >498)
Hematocrit: 38.7 % (ref 34.0–46.6)
Iron Saturation: 40 % (ref 15–55)
Iron: 104 ug/dL (ref 27–159)
Retic Ct Pct: 0.9 % (ref 0.6–2.6)
Total Iron Binding Capacity: 262 ug/dL (ref 250–450)
UIBC: 158 ug/dL (ref 131–425)
Vitamin B-12: 1179 pg/mL (ref 232–1245)

## 2021-08-04 LAB — CBC
Hemoglobin: 13 g/dL (ref 11.1–15.9)
MCH: 29 pg (ref 26.6–33.0)
MCHC: 33.6 g/dL (ref 31.5–35.7)
MCV: 86 fL (ref 79–97)
Platelets: 231 10*3/uL (ref 150–450)
RBC: 4.49 x10E6/uL (ref 3.77–5.28)
RDW: 12.4 % (ref 11.7–15.4)
WBC: 3.6 10*3/uL (ref 3.4–10.8)

## 2021-08-04 LAB — TSH: TSH: 2.48 u[IU]/mL (ref 0.450–4.500)

## 2021-09-03 ENCOUNTER — Other Ambulatory Visit: Payer: Self-pay

## 2021-09-03 DIAGNOSIS — E039 Hypothyroidism, unspecified: Secondary | ICD-10-CM

## 2021-09-03 MED ORDER — LEVOTHYROXINE SODIUM 25 MCG PO TABS: 25.0000 ug | ORAL_TABLET | Freq: Every day | ORAL | 3 refills | Status: DC

## 2021-09-21 ENCOUNTER — Encounter: Payer: Self-pay | Admitting: Family Medicine

## 2021-09-23 ENCOUNTER — Other Ambulatory Visit: Payer: Self-pay | Admitting: Family Medicine

## 2021-09-23 ENCOUNTER — Encounter: Payer: Self-pay | Admitting: Family Medicine

## 2021-09-23 DIAGNOSIS — Z111 Encounter for screening for respiratory tuberculosis: Secondary | ICD-10-CM

## 2021-09-23 DIAGNOSIS — Z113 Encounter for screening for infections with a predominantly sexual mode of transmission: Secondary | ICD-10-CM

## 2021-09-24 ENCOUNTER — Other Ambulatory Visit (HOSPITAL_COMMUNITY)
Admission: RE | Admit: 2021-09-24 | Discharge: 2021-09-24 | Disposition: A | Discharge status: Home / Self Care | Payer: Medicaid Other | Source: Ambulatory Visit | Attending: Family Medicine | Admitting: Family Medicine

## 2021-09-24 ENCOUNTER — Other Ambulatory Visit: Payer: Medicaid Other

## 2021-09-24 ENCOUNTER — Other Ambulatory Visit: Payer: Self-pay

## 2021-09-24 DIAGNOSIS — Z111 Encounter for screening for respiratory tuberculosis: Secondary | ICD-10-CM

## 2021-09-24 DIAGNOSIS — Z113 Encounter for screening for infections with a predominantly sexual mode of transmission: Secondary | ICD-10-CM | POA: Diagnosis not present

## 2021-09-26 LAB — URINE CYTOLOGY ANCILLARY ONLY
Chlamydia: NEGATIVE
Comment: NEGATIVE
Comment: NORMAL
Neisseria Gonorrhea: NEGATIVE

## 2021-09-27 ENCOUNTER — Other Ambulatory Visit: Payer: Self-pay

## 2021-09-27 ENCOUNTER — Telehealth: Payer: Self-pay | Admitting: Family Medicine

## 2021-09-27 ENCOUNTER — Other Ambulatory Visit (HOSPITAL_COMMUNITY)
Admission: RE | Admit: 2021-09-27 | Discharge: 2021-09-27 | Disposition: A | Discharge status: Home / Self Care | Payer: Medicaid Other | Source: Ambulatory Visit | Attending: Family Medicine | Admitting: Family Medicine

## 2021-09-27 ENCOUNTER — Encounter: Payer: Self-pay | Admitting: Family Medicine

## 2021-09-27 ENCOUNTER — Ambulatory Visit (INDEPENDENT_AMBULATORY_CARE_PROVIDER_SITE_OTHER): Payer: Medicaid Other | Admitting: Family Medicine

## 2021-09-27 VITALS — BP 108/78 | HR 67 | Ht 67.0 in | Wt 141.2 lb

## 2021-09-27 DIAGNOSIS — Z124 Encounter for screening for malignant neoplasm of cervix: Secondary | ICD-10-CM | POA: Insufficient documentation

## 2021-09-27 NOTE — Patient Instructions (Addendum)
It was wonderful to see you today.  Please bring ALL of your medications with you to every visit.   Today we talked about:  --- Horse chestnut seed extract for cramps   -- It was great to see you--- good luck with the adoption process  -- I will call you with results of of your Pap smear    -- I will let you know ASAP about the notary paperwork   Thank you for choosing [facility].   Please call [phone] with any questions about today's appointment.  Please be sure to schedule follow up at the front  desk before you leave today.   Dorris Singh, MD  Family Medicine

## 2021-09-27 NOTE — Progress Notes (Signed)
    SUBJECTIVE:   Chief compliant/HPI: annual examination  Olivia Shepard is a 30 y.o. who presents today for an annual exam. She reports intermittent left leg pain.  This only occurs during menses.  She has associated varicose veins.  No difficulty walking.  No lower extremity edema.  Complete review of systems conducted which was negative.  Review of systems negative for chest pain, headaches, vision changes, dyspnea exertion, abdominal pain.  She takes a prenatal vitamin menses are regular.  Updated history tabs and problem list family history notable for father recently diagnosed with prostate cancer.   OBJECTIVE:   BP 108/78   Pulse 67   Ht $R'5\' 7"'JS$  (1.702 m)   Wt 141 lb 4 oz (64.1 kg)   LMP 09/18/2021   SpO2 100%   BMI 22.12 kg/m   HEENT: EOMI. Sclera without injection or icterus. MMM. External auditory canal examined and WNL. TM normal appearance, no erythema or bulging. Neck: Supple.  Cardiac: Regular rate and rhythm. Normal S1/S2. No murmurs, rubs, or gallops appreciated. Lungs: Clear bilaterally to ascultation.  Abdomen: Normoactive bowel sounds. No tenderness to deep or light palpation. No rebound or guarding.    Neuro: normal gait GU Exam:  Chaperoned exam.  External exam: Normal-appearing female external genitalia.  Vaginal exam notable for normal exam.  Cervix without discharge or obvious lesion.  Bimanual exam reveals normal sized uterus no masses appreciated, no pain with examination.  Mild TTP over left ischial tuberosity  Ext: no edema   Psych: Pleasant and appropriate   ASSESSMENT/PLAN:   Intermittent Pelvic Pain, unclear cause, possibly due to adhesive disease vs. Obturator irritation. Discussed--if persists consider Uro-Gynecology referral.   Assessment for Adoption the patient is in good health and appears to have no communicable diseases.  She will have a normal life expectancy. Continue Synthroid--TSH at goal. We will complete paperwork as her test are  available.  Annual Examination  See AVS for age appropriate recommendations.   PHQ score 0   Considered the following items based upon USPSTF recommendations: HIV testing: discussed Hepatitis C: discussed Hepatitis B: discussed Syphilis if at high risk: discussed GC/CT  previously negative  Lipid panel (nonfasting or fasting) discussed based upon AHA recommendations and not ordered.  Consider repeat every 4-6 years.  Reviewed risk factors for latent tuberculosis and requested  Discussed family history, BRCA testing not indicated.  Cervical cancer screening: due for Pap today, cytology + HPV ordered Immunizations up to date on all vaccines including COVID   Follow up in 1  year or sooner if indicated.   Repeat LDL at follow up.   Martyn Malay, MD [place]

## 2021-09-27 NOTE — Telephone Encounter (Signed)
Attempted to call patient to let her know that TB testing not yet resulted.  Unable to reach patient.

## 2021-09-28 ENCOUNTER — Encounter: Payer: Self-pay | Admitting: Family Medicine

## 2021-09-29 LAB — HEPATITIS B CORE ANTIBODY, TOTAL: Hep B Core Total Ab: NEGATIVE

## 2021-09-29 LAB — HIV ANTIBODY (ROUTINE TESTING W REFLEX): HIV Screen 4th Generation wRfx: NONREACTIVE

## 2021-09-29 LAB — QUANTIFERON-TB GOLD PLUS
QuantiFERON Mitogen Value: >10 IU/mL
QuantiFERON Nil Value: 0.06 IU/mL
QuantiFERON TB1 Ag Value: 0.04 IU/mL
QuantiFERON TB2 Ag Value: 0.05 IU/mL
QuantiFERON-TB Gold Plus: NEGATIVE

## 2021-09-29 LAB — HCV INTERPRETATION

## 2021-09-29 LAB — RPR: RPR Ser Ql: NONREACTIVE

## 2021-09-29 LAB — HEPATITIS B SURFACE ANTIBODY, QUANTITATIVE: Hepatitis B Surf Ab Quant: 106.1 m[IU]/mL (ref >9.9)

## 2021-09-29 LAB — HEPATITIS B SURFACE ANTIGEN: Hepatitis B Surface Ag: NEGATIVE

## 2021-09-29 LAB — HCV AB W REFLEX TO QUANT PCR: HCV Ab: <0.1 s/co ratio (ref 0.0–0.9)

## 2021-10-01 ENCOUNTER — Telehealth: Payer: Self-pay | Admitting: Family Medicine

## 2021-10-01 NOTE — Telephone Encounter (Signed)
Adoption paperwork--- Reviewed, completed, and signed form.  Note routed to RN team inbasket and placed completed form in Clinic RN's office (wall pocket above desk).  Martyn Malay, MD

## 2021-10-07 ENCOUNTER — Telehealth: Payer: Self-pay | Admitting: Family Medicine

## 2021-10-07 LAB — CYTOLOGY - PAP
Comment: NEGATIVE
Diagnosis: UNDETERMINED — AB
High risk HPV: NEGATIVE

## 2021-10-07 NOTE — Telephone Encounter (Signed)
Called patient and discussed results. HPV negative, recommend repeat in 3 years. Patient would prefer 1 year follow up--discussed false positive rate. Re-examine 1 year.  Dorris Singh, MD  Family Medicine Teaching Service

## 2021-10-07 NOTE — Telephone Encounter (Signed)
Patient returns call to nurse line requesting to speak with Dr. Owens Shark regarding results.   Please advise.   Talbot Grumbling, RN

## 2021-10-07 NOTE — Telephone Encounter (Signed)
Attempted to call patient with results. ASCUS and HPV negative with adequate sample. Recommend repeat in 3 years.  Left generic voicemail. If patient calls back, please let her know repeat is due in 3 years.  Sent via [employer] as well.  Dorris Singh, MD  Family Medicine Teaching Service

## 2021-10-08 NOTE — Telephone Encounter (Signed)
Patient contacted and advised of forms and letter ready for pick up.   A copy of form was made for batch scanning.

## 2021-10-25 DIAGNOSIS — Z3201 Encounter for pregnancy test, result positive: Secondary | ICD-10-CM | POA: Diagnosis not present

## 2021-11-10 NOTE — L&D Delivery Note (Signed)
Delivery Note   Called to patient's room and told that the head was already out.  When I arrived less than 3 minutes later, baby had delivered and patient was on all knees, holding baby with placenta still in.  Per RN , pt stated had urge to push and immediately pushed once and at 1:42 AM a healthy female was delivered via Vaginal, Spontaneous (Presentation: unknown).  APGAR: 8, 9; weight  pending.  We helped her lie down and then cut the cord, placenta was visible at introitus and delivered with one push.  Placenta status: Spontaneous, Intact.  Cord:   with the following complications: none .    Anesthesia:  nitrous Episiotomy:  none Lacerations:  none  Est. Blood Loss (mL):  115m   Mom to postpartum.  Baby to Couplet care / Skin to Skin. D/w pt and husband circumcision and they desire. Pt requested IV toradol post delivery and given.  Bleeding well-controlled and pt declined IV pitocin  KLogan Bores12/01/2022, 2:08 AM

## 2021-11-19 DIAGNOSIS — Z3169 Encounter for other general counseling and advice on procreation: Secondary | ICD-10-CM | POA: Diagnosis not present

## 2021-11-21 DIAGNOSIS — Z3169 Encounter for other general counseling and advice on procreation: Secondary | ICD-10-CM | POA: Diagnosis not present

## 2021-11-29 ENCOUNTER — Other Ambulatory Visit: Payer: Self-pay

## 2021-11-29 ENCOUNTER — Ambulatory Visit: Payer: Medicaid Other | Admitting: Family Medicine

## 2021-11-29 VITALS — BP 104/70 | HR 76 | Wt 141.0 lb

## 2021-11-29 DIAGNOSIS — N3090 Cystitis, unspecified without hematuria: Secondary | ICD-10-CM | POA: Diagnosis not present

## 2021-11-29 DIAGNOSIS — R399 Unspecified symptoms and signs involving the genitourinary system: Secondary | ICD-10-CM

## 2021-11-29 LAB — POCT URINALYSIS DIP (MANUAL ENTRY)
Bilirubin, UA: NEGATIVE
Blood, UA: NEGATIVE
Glucose, UA: NEGATIVE mg/dL
Ketones, POC UA: NEGATIVE mg/dL
Leukocytes, UA: NEGATIVE
Nitrite, UA: POSITIVE — AB
Protein Ur, POC: 30 mg/dL — AB
Spec Grav, UA: 1.015 (ref 1.010–1.025)
Urobilinogen, UA: 0.2 E.U./dL
pH, UA: 7 (ref 5.0–8.0)

## 2021-11-29 LAB — POCT UA - MICROSCOPIC ONLY

## 2021-11-29 MED ORDER — CEPHALEXIN 500 MG PO CAPS: 500.0000 mg | ORAL_CAPSULE | Freq: Two times a day (BID) | ORAL | 0 refills | Status: AC

## 2021-11-29 NOTE — Progress Notes (Signed)
° ° °  SUBJECTIVE:   CHIEF COMPLAINT / HPI:  Chief Complaint  Patient presents with   Urinary Tract Infection    Started having dysuria and urinary urgency this morning around 3am. Denies back pain, fever, chills. Has had a UTI before and this feels similar. Reports previous proteus mirabilis UTI before which was sensitive to cephalexin but resistant to nitrofurantoin and tetracycline.  PERTINENT  PMH / PSH: noncontributory  Patient Care Team: Martyn Malay, MD as PCP - General (Family Medicine)   OBJECTIVE:   BP 104/70    Pulse 76    Wt 141 lb (64 kg)    SpO2 100%    BMI 22.08 kg/m   Physical Exam Constitutional:      General: She is not in acute distress. Cardiovascular:     Rate and Rhythm: Normal rate and regular rhythm.  Pulmonary:     Effort: Pulmonary effort is normal. No respiratory distress.     Breath sounds: Normal breath sounds.  Abdominal:     Tenderness: There is no right CVA tenderness or left CVA tenderness.  Musculoskeletal:     Cervical back: Neck supple.  Neurological:     Mental Status: She is alert.     Depression screen [facility] 2/9 09/27/2021  Decreased Interest 0  Down, Depressed, Hopeless 0  PHQ - 2 Score 0  Altered sleeping 0  Tired, decreased energy 0  Change in appetite 0  Feeling bad or failure about yourself  0  Trouble concentrating 0  Moving slowly or fidgety/restless 0  Suicidal thoughts 0  PHQ-9 Score 0  Difficult doing work/chores Not difficult at all     {Show previous vital signs (optional):23777}    ASSESSMENT/PLAN:   Acute cystitis Typical UTI symptoms with positive nitrites on UA. No signs of ascending infection. - cephalexin 500 mg BID x 7 d - urine culture  Return if symptoms worsen or fail to improve.   Zola Button, MD [place]

## 2021-11-29 NOTE — Patient Instructions (Addendum)
It was nice seeing you today!  Take antibiotics as prescribed.  Stay well, Zola Button, MD [facility] [phone]  --  Make sure to check out at the front desk before you leave today.  Please arrive at least 15 minutes prior to your scheduled appointments.  If you had blood work today, I will send you a MyChart message or a letter if results are normal. Otherwise, I will give you a call.  If you had a referral placed, they will call you to set up an appointment. Please give [place] a call if you don't hear back in the next 2 weeks.  If you need additional refills before your next appointment, please call your pharmacy first.

## 2021-12-04 LAB — URINE CULTURE

## 2021-12-12 ENCOUNTER — Other Ambulatory Visit: Payer: Self-pay

## 2021-12-12 ENCOUNTER — Encounter (HOSPITAL_COMMUNITY): Payer: Self-pay | Admitting: Emergency Medicine

## 2021-12-12 ENCOUNTER — Ambulatory Visit (HOSPITAL_COMMUNITY)
Admission: EM | Admit: 2021-12-12 | Discharge: 2021-12-12 | Disposition: A | Discharge status: Home / Self Care | Payer: Medicaid Other | Attending: Student | Admitting: Student

## 2021-12-12 DIAGNOSIS — R300 Dysuria: Secondary | ICD-10-CM | POA: Diagnosis not present

## 2021-12-12 LAB — POCT URINALYSIS DIPSTICK, ED / UC
Bilirubin Urine: NEGATIVE
Glucose, UA: NEGATIVE mg/dL
Hgb urine dipstick: NEGATIVE
Ketones, ur: NEGATIVE mg/dL
Leukocytes,Ua: NEGATIVE
Nitrite: NEGATIVE
Protein, ur: 100 mg/dL — AB
Specific Gravity, Urine: >=1.03 (ref 1.005–1.030)
Urobilinogen, UA: 0.2 mg/dL (ref 0.0–1.0)
pH: 6 (ref 5.0–8.0)

## 2021-12-12 LAB — POC URINE PREG, ED: Preg Test, Ur: NEGATIVE

## 2021-12-12 MED ORDER — FLUCONAZOLE 150 MG PO TABS: 150.0000 mg | ORAL_TABLET | Freq: Once | ORAL | 0 refills | Status: AC

## 2021-12-12 NOTE — ED Provider Notes (Signed)
[place]    CSN: [id number] Arrival date & time: 12/12/21  1540      History   Chief Complaint Chief Complaint  Patient presents with   Dysuria    HPI Olivia Shepard is a 31 y.o. female presenting with concern for UTI. Last evaluated by PCP for this on 11/29/21 and prescribed keflex, which she has completed as directed. Reports previous proteus mirabilis UTI before which was sensitive to cephalexin but resistant to nitrofurantoin and tetracycline.  States history of recurrent UTI.  Today with dysuria that is constant worse with urination.  Denies other symptoms including abdominal pain, flank pain, hematuria, frequency, fever/chills, vaginal discharge.  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 Authorizing Provider  fluconazole (DIFLUCAN) 150 MG tablet Take 1 tablet (150 mg total) by mouth once for 1 dose. -For your yeast infection, start the Diflucan (fluconazole)- Take one pill today (day 1). If you're still having symptoms in 3 days, take the second pill. 12/12/21 12/12/21  Yes Hazel Sams, PA-C  levothyroxine (SYNTHROID) 25 MCG tablet Take 1 tablet (25 mcg total) by mouth daily before breakfast. 09/03/21  Yes Martyn Malay, MD  Multiple Vitamin (MULTIVITAMIN ADULT PO) Take 1 tablet by mouth daily.   Yes [provider]  Cholecalciferol 25 MCG (1000 UT) tablet Take by mouth. 12/21/19   [provider]  COVID-19 mRNA Vac-TriS, Pfizer, SUSP injection USE AS DIRECTED 12/25/20 12/25/21  Carlyle Basques, MD  lansoprazole (PREVACID) 30 MG capsule Take 30 mg by mouth daily. 05/25/20   [provider]  nystatin-triamcinolone (MYCOLOG II) cream Apply topically. 07/12/19   [provider]    Family History Family History  Problem Relation Age of Onset   Diabetes Mother    Atrial fibrillation Father    Prostate cancer Father    Lung cancer Maternal Grandmother    Colon cancer Paternal Great-grandfather    Stomach cancer Paternal Great-grandfather    Colon cancer Paternal Great-grandmother    Esophageal cancer Neg Hx     Social History Social History   Tobacco Use   Smoking status: Never   Smokeless tobacco: Never  Vaping Use   Vaping Use: Never used  Substance Use Topics   Alcohol  use: No    Alcohol/week: 0.0 standard drinks   Drug use: No     Allergies   Patient has no known allergies.   Review of Systems Review of Systems  Constitutional:  Negative for appetite change, chills, diaphoresis and fever.  Respiratory:  Negative for shortness of breath.   Cardiovascular:  Negative for chest pain.  Gastrointestinal:  Negative for abdominal pain, blood in stool, constipation, diarrhea, nausea and vomiting.  Genitourinary:  Positive for dysuria. Negative for decreased urine volume, difficulty urinating, flank pain, frequency, genital sores, hematuria and urgency.  Musculoskeletal:  Negative for back pain.  Neurological:  Negative for dizziness, weakness and light-headedness.  All other systems reviewed and are  negative.   Physical Exam Triage Vital Signs ED Triage Vitals  Enc Vitals Group     BP      Pulse      Resp      Temp      Temp src      SpO2      Weight      Height      Head Circumference      Peak Flow      Pain Score      Pain Loc      Pain Edu?      Excl. in [place]?    No data found.  Updated Vital Signs BP 108/70    Pulse 71    Temp 98.1 F (36.7 C) (Oral)    Resp 16    LMP 11/21/2021    SpO2 98%   Visual Acuity Right Eye Distance:   Left Eye Distance:   Bilateral Distance:    Right Eye Near:   Left Eye Near:    Bilateral Near:     Physical Exam Vitals reviewed.  Constitutional:      General: She is not in acute distress.    Appearance: Normal appearance. She is not ill-appearing.  HENT:     Head: Normocephalic and atraumatic.     Mouth/Throat:     Mouth: Mucous membranes are moist.     Comments: Moist mucous membranes Eyes:     Extraocular Movements: Extraocular movements intact.     Pupils: Pupils are equal, round, and reactive to light.  Cardiovascular:     Rate and Rhythm: Normal rate and regular rhythm.     Heart sounds: Normal heart sounds.  Pulmonary:     Effort: Pulmonary effort is normal.     Breath sounds: Normal breath sounds. No wheezing, rhonchi or rales.  Abdominal:     General: Bowel sounds are normal. There is no distension.     Palpations: Abdomen is soft. There is no mass.     Tenderness: There is no abdominal tenderness. There is no right CVA tenderness, left CVA tenderness, guarding or rebound.  Genitourinary:    Comments: deferred Skin:    General: Skin is warm.     Capillary Refill: Capillary refill takes less than 2 seconds.     Comments: Good skin turgor  Neurological:     General: No focal deficit present.     Mental Status: She is alert and oriented to person, place, and time.  Psychiatric:        Mood and Affect: Mood normal.        Behavior: Behavior normal.     UC Treatments / Results  Labs (all labs ordered  are listed, but only abnormal results are displayed) Labs Reviewed  POCT URINALYSIS DIPSTICK, ED /  UC - Abnormal; Notable for the following components:      Result Value   Protein, ur 100 (*)    All other components within normal limits  URINE CULTURE  POC URINE PREG, ED  CERVICOVAGINAL ANCILLARY ONLY    EKG   Radiology No results found.  Procedures Procedures (including critical care time)  Medications Ordered in UC Medications - No data to display  Initial Impression / Assessment and Plan / UC Course  I have reviewed the triage vital signs and the nursing notes.  Pertinent labs & imaging results that were available during my care of the patient were reviewed by me and considered in my medical decision making (see chart for details).     This patient is a very pleasant 31 y.o. year old female presenting with dysuria. Last evaluated by PCP for this on 11/29/21 and prescribed keflex for UTI, which she has completed as directed with only some relief.   UA negative for blood, leuk, nitrite. Culture sent.  Suspect vaginitis. Denies STI risk. Will send self-swab for G/C, trich, yeast, BV testing. Declines HIV, RPR. Safe sex precautions.   Diflucan sent.   ED return precautions discussed. Patient verbalizes understanding and agreement.   Coding Level 4 for review of past notes/labs, order and interpretation of labs today, and prescription drug management  Final Clinical Impressions(s) / UC Diagnoses   Final diagnoses:  Dysuria     Discharge Instructions      -For your yeast infection, start the Diflucan (fluconazole)- Take one pill today (day 1). If you're still having symptoms in 3 days, take the second pill.  -We'll call if any other positive test results, and could send additional treatment at that time     ED Prescriptions     Medication Sig Dispense Auth. Provider   fluconazole (DIFLUCAN) 150 MG tablet Take 1 tablet (150 mg total) by mouth once for 1 dose.  -For your yeast infection, start the Diflucan (fluconazole)- Take one pill today (day 1). If you're still having symptoms in 3 days, take the second pill. 2 tablet Hazel Sams, PA-C      PDMP not reviewed this encounter.   Hazel Sams, PA-C 12/12/21 1056

## 2021-12-12 NOTE — ED Triage Notes (Signed)
PT has history of ABX resistant UTIs. Recently treated for UTI with partial relief of symptoms, still have dysuria.

## 2021-12-12 NOTE — Discharge Instructions (Addendum)
-  For your yeast infection, start the Diflucan (fluconazole)- Take one pill today (day 1). If you're still having symptoms in 3 days, take the second pill.  -We'll call if any other positive test results, and could send additional treatment at that time

## 2021-12-13 LAB — CERVICOVAGINAL ANCILLARY ONLY
Bacterial Vaginitis (gardnerella): NEGATIVE
Candida Glabrata: NEGATIVE
Candida Vaginitis: NEGATIVE
Chlamydia: NEGATIVE
Comment: NEGATIVE
Comment: NEGATIVE
Comment: NEGATIVE
Comment: NEGATIVE
Comment: NEGATIVE
Comment: NORMAL
Neisseria Gonorrhea: NEGATIVE
Trichomonas: NEGATIVE

## 2021-12-13 LAB — URINE CULTURE: Culture: NO GROWTH

## 2021-12-18 ENCOUNTER — Encounter: Payer: Self-pay | Admitting: Family Medicine

## 2021-12-23 ENCOUNTER — Other Ambulatory Visit: Payer: Self-pay | Admitting: Family Medicine

## 2021-12-23 DIAGNOSIS — R300 Dysuria: Secondary | ICD-10-CM

## 2021-12-26 ENCOUNTER — Other Ambulatory Visit: Payer: Self-pay

## 2021-12-26 ENCOUNTER — Other Ambulatory Visit: Payer: Medicaid Other

## 2021-12-26 DIAGNOSIS — R300 Dysuria: Secondary | ICD-10-CM | POA: Diagnosis not present

## 2021-12-31 ENCOUNTER — Telehealth: Payer: Self-pay | Admitting: Family Medicine

## 2021-12-31 DIAGNOSIS — R102 Pelvic and perineal pain: Secondary | ICD-10-CM

## 2021-12-31 LAB — GENITAL MYCOPLASMAS NAA, URINE
Mycoplasma genitalium NAA: NEGATIVE
Mycoplasma hominis NAA: NEGATIVE
Ureaplasma spp NAA: NEGATIVE

## 2021-12-31 NOTE — Telephone Encounter (Signed)
Called patient to discuss results.  She is a G4, P4 and has had intermittent left-sided pelvic pain for quite some time.  This typically occurs with menses but can be bothersome to her.  More recently she has had multiple bouts of severe dysuria with negative urine culture x3.  She had a negative mycoplasma and Ureaplasma.  No hematuria.  Discussed at length.  Referred to urogynecology for further recommendations, suspicious for potential painful bladder syndrome.

## 2022-01-14 ENCOUNTER — Emergency Department (HOSPITAL_BASED_OUTPATIENT_CLINIC_OR_DEPARTMENT_OTHER)
Admission: EM | Admit: 2022-01-14 | Discharge: 2022-01-15 | Disposition: A | Discharge status: Home / Self Care | Payer: Medicaid Other | Attending: Emergency Medicine | Admitting: Emergency Medicine

## 2022-01-14 ENCOUNTER — Emergency Department (HOSPITAL_BASED_OUTPATIENT_CLINIC_OR_DEPARTMENT_OTHER): Payer: Medicaid Other

## 2022-01-14 ENCOUNTER — Other Ambulatory Visit: Payer: Self-pay

## 2022-01-14 ENCOUNTER — Encounter (HOSPITAL_BASED_OUTPATIENT_CLINIC_OR_DEPARTMENT_OTHER): Payer: Self-pay | Admitting: Emergency Medicine

## 2022-01-14 ENCOUNTER — Telehealth: Payer: Self-pay

## 2022-01-14 DIAGNOSIS — R300 Dysuria: Secondary | ICD-10-CM | POA: Diagnosis not present

## 2022-01-14 DIAGNOSIS — M545 Low back pain, unspecified: Secondary | ICD-10-CM | POA: Diagnosis not present

## 2022-01-14 DIAGNOSIS — M5126 Other intervertebral disc displacement, lumbar region: Secondary | ICD-10-CM | POA: Insufficient documentation

## 2022-01-14 DIAGNOSIS — R110 Nausea: Secondary | ICD-10-CM | POA: Diagnosis not present

## 2022-01-14 DIAGNOSIS — S3992XA Unspecified injury of lower back, initial encounter: Secondary | ICD-10-CM | POA: Diagnosis not present

## 2022-01-14 DIAGNOSIS — Z20822 Contact with and (suspected) exposure to covid-19: Secondary | ICD-10-CM | POA: Insufficient documentation

## 2022-01-14 DIAGNOSIS — X500XXA Overexertion from strenuous movement or load, initial encounter: Secondary | ICD-10-CM | POA: Diagnosis not present

## 2022-01-14 LAB — URINALYSIS, ROUTINE W REFLEX MICROSCOPIC
Bilirubin Urine: NEGATIVE
Glucose, UA: NEGATIVE mg/dL
Ketones, ur: NEGATIVE mg/dL
Leukocytes,Ua: NEGATIVE
Nitrite: NEGATIVE
Protein, ur: NEGATIVE mg/dL
Specific Gravity, Urine: 1.015 (ref 1.005–1.030)
pH: 6 (ref 5.0–8.0)

## 2022-01-14 LAB — PREGNANCY, URINE: Preg Test, Ur: NEGATIVE

## 2022-01-14 MED ORDER — HYDROMORPHONE HCL 1 MG/ML IJ SOLN
1.0000 mg | INTRAMUSCULAR | Status: DC | PRN
  Filled 2022-01-14: qty 1

## 2022-01-14 MED ORDER — SODIUM CHLORIDE 0.9 % IV SOLN: Freq: Once | INTRAVENOUS | Status: AC

## 2022-01-14 MED ORDER — DEXAMETHASONE SODIUM PHOSPHATE 10 MG/ML IJ SOLN
10.0000 mg | Freq: Once | INTRAMUSCULAR | Status: AC
  Administered 2022-01-14: 10 mg via INTRAVENOUS
  Filled 2022-01-14: qty 1

## 2022-01-14 MED ORDER — OXYCODONE-ACETAMINOPHEN 5-325 MG PO TABS
1.0000 | ORAL_TABLET | ORAL | Status: AC | PRN
  Administered 2022-01-14 (×2): 1 via ORAL
  Filled 2022-01-14 (×2): qty 1

## 2022-01-14 MED ORDER — METHOCARBAMOL 1000 MG/10ML IJ SOLN
500.0000 mg | Freq: Once | INTRAMUSCULAR | Status: DC
  Filled 2022-01-14: qty 10

## 2022-01-14 MED ORDER — METHOCARBAMOL 500 MG PO TABS
750.0000 mg | ORAL_TABLET | Freq: Once | ORAL | Status: AC
  Administered 2022-01-14: 750 mg via ORAL
  Filled 2022-01-14: qty 2

## 2022-01-14 MED ORDER — HYDROMORPHONE HCL 1 MG/ML IJ SOLN
1.0000 mg | Freq: Once | INTRAMUSCULAR | Status: AC
  Administered 2022-01-14: 1 mg via INTRAVENOUS
  Filled 2022-01-14: qty 1

## 2022-01-14 MED ORDER — ONDANSETRON HCL 4 MG/2ML IJ SOLN
4.0000 mg | Freq: Once | INTRAMUSCULAR | Status: AC
Start: 2022-01-14 — End: 2022-01-14
  Administered 2022-01-14: 4 mg via INTRAVENOUS
  Filled 2022-01-14: qty 2

## 2022-01-14 NOTE — Telephone Encounter (Signed)
Patient returns call to nurse line. Patient reports that she is unable to stand or walk due to pain. States that husband had to carry her to the car.  ? ?Advised that she should be evaluated today. Patient states that she is already on her way to the ED, she just wanted to check in and make Dr. Owens Shark aware.  ? ?Forwarding to PCP.  ? ?Talbot Grumbling, RN ? ?

## 2022-01-14 NOTE — ED Provider Notes (Signed)
[facility] EMERGENCY DEPT Provider Note   CSN: [id number] Arrival date & time: 01/14/22  1608     History  Chief Complaint  Patient presents with   Back Pain    Olivia Shepard is a 31 y.o. female who presents to the emergency department complaining of lower back pain onset 10 AM today.  She notes that she picked up her child in felt a spasm to her lower back after picking up her child.  Patient was also laying in bed and noted a spasm to her lower back.  She attempted to have her chiropractor assess her however noted increased pain to her lower back on assessment.  Patient had a back brace placed by her chiropractor.  Has associated nausea, numbness to posterior right lower extremity, dysuria (patient previously with a UTI and treated with Keflex, she has a follow-up appointment with urogyn on 04/09/2022).  Has not tried medications for her symptoms. Denies bowel/bladder incontinence, saddle paresthesia, nausea, vomiting, dysuria, hematuria.   The history is provided by the patient. No language interpreter was used.      Home Medications Prior to Admission medications   Medication Sig Start Date End Date Taking? Authorizing Provider  Cholecalciferol 25 MCG (1000 UT) tablet Take by mouth. 12/21/19   [provider]  lansoprazole (PREVACID) 30 MG capsule Take 30 mg by mouth daily. 05/25/20   [provider]  levothyroxine (SYNTHROID) 25 MCG tablet Take 1 tablet (25 mcg total) by mouth daily before breakfast. 09/03/21   Martyn Malay, MD  Multiple Vitamin (MULTIVITAMIN ADULT PO) Take 1 tablet by mouth daily.    [provider]  nystatin-triamcinolone (MYCOLOG II) cream Apply topically. 07/12/19   [provider]      Allergies    Patient has no known allergies.    Review of Systems   Review of Systems  Constitutional:  Negative for chills and fever.  Gastrointestinal:  Positive for nausea. Negative for vomiting.       -bowel incontinence   Genitourinary:  Negative for dysuria and hematuria.       -bladder incontinence  Musculoskeletal:  Positive for back pain (Lower).  Skin:  Negative for rash.  Neurological:  Positive for numbness.  All other systems reviewed and are negative.  Physical Exam Updated Vital Signs BP 110/77 (BP Location: Left Arm)    Pulse 84    Temp 97.8 F (36.6 C)    Resp 18    Ht '5\' 7"'$  (1.702 m)    Wt 63.5 kg    SpO2 100%    BMI 21.93 kg/m  Physical Exam Vitals and nursing note reviewed.  Constitutional:      General: She is not in acute distress.    Appearance: She is not diaphoretic.  HENT:     Head: Normocephalic and atraumatic.     Mouth/Throat:     Pharynx: No oropharyngeal exudate.  Eyes:     General: No scleral icterus.    Conjunctiva/sclera: Conjunctivae normal.  Cardiovascular:     Rate and Rhythm: Normal rate and regular rhythm.     Pulses: Normal pulses.     Heart sounds: Normal heart sounds.  Pulmonary:     Effort: Pulmonary effort is normal. No respiratory distress.     Breath sounds: Normal breath sounds. No wheezing.  Abdominal:     General: Bowel sounds are normal.     Palpations: Abdomen is soft. There is no mass.     Tenderness: There is no  abdominal tenderness. There is no guarding or rebound.  Musculoskeletal:        General: Normal range of motion.     Cervical back: Normal range of motion and neck supple.     Comments: Patient with back brace in place.  Tenderness to palpation noted to lumbar spine without overlying skin changes.  No tenderness to palpation noted to musculature of back.  No tenderness to palpation noted to the C, T spine.  Decreased strength noted to right lower extremity.  Sensation intact to bilateral lower extremities.  Pedal pulses intact bilaterally.  Skin:    General: Skin is warm and dry.  Neurological:     Mental Status: She is alert.  Psychiatric:        Behavior: Behavior normal.    ED Results / Procedures / Treatments   Labs (all  labs ordered are listed, but only abnormal results are displayed) Labs Reviewed  URINALYSIS, ROUTINE W REFLEX MICROSCOPIC - Abnormal; Notable for the following components:      Result Value   Hgb urine dipstick TRACE (*)    All other components within normal limits  PREGNANCY, URINE    EKG None  Radiology CT Lumbar Spine Wo Contrast  Result Date: 01/14/2022 CLINICAL DATA:  Initial evaluation for acute trauma, lower back pain. EXAM: CT LUMBAR SPINE WITHOUT CONTRAST TECHNIQUE: Multidetector CT imaging of the lumbar spine was performed without intravenous contrast administration. Multiplanar CT image reconstructions were also generated. RADIATION DOSE REDUCTION: This exam was performed according to the departmental dose-optimization program which includes automated exposure control, adjustment of the mA and/or kV according to patient size and/or use of iterative reconstruction technique. COMPARISON:  None available. FINDINGS: Segmentation: Transitional features seen about the lumbosacral junction. For the purposes of this dictation, lowest rib-bearing vertebral body will be labeled T12. There is lumbarization of the S1 segment with a rudimentary S1-2 interspace. Alignment: Mild levoscoliosis with straightening of the normal lumbar lordosis. No listhesis. Vertebrae: Vertebral body height maintained without acute or chronic fracture. No discrete or worrisome osseous lesions. Visualized sacrum and pelvis intact. SI joints symmetric and within normal limits. Paraspinal and other soft tissues: Paraspinous soft tissues within normal limits. Disc levels: L1-2:  Unremarkable. L2-3:  Unremarkable. L3-4:  Unremarkable. L4-5: Mild disc bulge. No spinal stenosis. Foramina remain patent. L5-S1: Mild posterior disc bulge. Minimal facet spurring. Resultant mild narrowing of the lateral recesses bilaterally. Central canal remains patent. No significant foraminal encroachment. S1-2: Rudimentary S1-2 interspace. No  significant disc bulge. Right worse than left facet hypertrophy. No stenosis or impingement. IMPRESSION: 1. No acute traumatic injury within the lumbar spine. 2. Mild disc bulge with facet spurring at L5-S1 with resultant mild narrowing of the lateral recesses bilaterally. No other significant stenosis or impingement. 3. Transitional lumbosacral anatomy. Careful correlation with numbering system on this exam recommended prior to any potential future intervention. Electronically Signed   By: Jeannine Boga M.D.   On: 01/14/2022 20:32    Procedures Procedures    Medications Ordered in ED Medications  HYDROmorphone (DILAUDID) injection 1 mg (has no administration in time range)  dexamethasone (DECADRON) injection 10 mg (has no administration in time range)  oxyCODONE-acetaminophen (PERCOCET/ROXICET) 5-325 MG per tablet 1 tablet (1 tablet Oral Given 01/14/22 1924)  methocarbamol (ROBAXIN) tablet 750 mg (750 mg Oral Given 01/14/22 1736)    ED Course/ Medical Decision Making/ A&P Clinical Course as of 01/14/22 2149  Tue Jan 14, 2022  2133 Patient reevaluated and noted that she still  has pain.  Also notes that she has been with a shuffled gait since picking up her child earlier.  She notes that she has numbness down the back of her right lower extremity.  Also notes that she is unable to lift her right lower extremity high, not due to pain but due to the inability to do so. [SB]    Clinical Course User Index [SB] Chaniyah Jahr A, PA-C                           Medical Decision Making Amount and/or Complexity of Data Reviewed Labs: ordered. Radiology: ordered.  Risk Prescription drug management.   Patient with lumbar back pain onset prior to arrival status post lifting up her child.  Decreased strength noted to right lower extremity.  Sensation intact to bilateral lower extremities.  Pedal pulses intact bilaterally.  No loss of bowel or bladder control.  No saddle paresthesia.  No urinary  symptoms suggestive of UTI.  Vital signs stable, patient afebrile, not tachycardic or hypoxic. Differential diagnosis includes but is not limited to fracture, herniation, muscle strain, cauda equina, pancreatitis, appendicitis, pyelonephritis, nephrolithiasis.   Labs:  I ordered, and personally interpreted labs.  The pertinent results include:   Urinalysis with trace hemoglobin otherwise unremarkable. Negative pregnancy urine  Imaging: I ordered imaging studies including CT lumbar spine I independently visualized and interpreted imaging which showed: 1. No acute traumatic injury within the lumbar spine. 2. Mild disc bulge with facet spurring at L5-S1 with resultant mild narrowing of the lateral recesses bilaterally. No other significant stenosis or impingement.  I agree with the radiologist interpretation  Medications:  I ordered medication including Percocet, Robaxin for pain management Reevaluation of the patient after these medicines and interventions, I reevaluated the patient and found that they have stayed the same I have reviewed the patients home medicines and have made adjustments as needed  Patient case discussed with Dr. Johnney Killian at sign-out. Plan at sign-out is pending pain control and symptom managment. Pt may need transfer for MRI to rule out impingement if symptoms persist. Patient care transferred at sign out.    This chart was dictated using voice recognition software, Dragon. Despite the best efforts of this provider to proofread and correct errors, errors may still occur which can change documentation meaning.   Final Clinical Impression(s) / ED Diagnoses Final diagnoses:  Acute midline low back pain, unspecified whether sciatica present    Rx / DC Orders ED Discharge Orders     None         Wandy Bossler A, PA-C 01/14/22 2206    Charlesetta Shanks, MD 01/16/22 2313

## 2022-01-14 NOTE — ED Notes (Signed)
Patient transported to CT 

## 2022-01-14 NOTE — Telephone Encounter (Signed)
Patient calls nurse line and LVM regarding "excruciating back pain." ? ?Attempted to call patient back. Patient did not answer, LVM for patient to return call to office to discuss message further.  ? ?Talbot Grumbling, RN ? ?

## 2022-01-14 NOTE — ED Triage Notes (Signed)
Pt reports lower mid back pain around 10 am today. Pt states she was picking up her 23 lb son when her back locked up.  ?

## 2022-01-14 NOTE — ED Provider Notes (Signed)
I provided a substantive portion of the care of this patient.  I personally performed the entirety of the medical decision making for this encounter. ? ?   ?Reassess and dispo. ? ?Patient severe sudden back pain with lifting her child.  She has been treated for pain but continues to have weakness of the right lower extremity with numbness to the back of the leg.  She can easily elevate the left lower extremity.  At this time with neurologic symptoms persisting after pain control, will proceed with MRI. ? ?Plan for ED to ED transfer to [facility] for MRI.  Dr. Carmin Muskrat except for transfer. ? ?If MRI without any acute impingement and patient adequately pain controlled anticipate discharge with pain medication and muscle relaxer.  Patient did get Decadron 10 mg IV in the emergency department at [place]. ?  ?Charlesetta Shanks, MD ?01/14/22 2351 ? ?

## 2022-01-14 NOTE — ED Notes (Signed)
Pt c/o nausea/dizziness.  Dr. Johnney Killian notified, orders received for Zofran 4 mg IVx1. ?

## 2022-01-15 ENCOUNTER — Emergency Department (HOSPITAL_COMMUNITY): Payer: Medicaid Other

## 2022-01-15 ENCOUNTER — Encounter: Payer: Self-pay | Admitting: Family Medicine

## 2022-01-15 DIAGNOSIS — M5441 Lumbago with sciatica, right side: Secondary | ICD-10-CM

## 2022-01-15 DIAGNOSIS — R110 Nausea: Secondary | ICD-10-CM | POA: Diagnosis not present

## 2022-01-15 DIAGNOSIS — M545 Low back pain, unspecified: Secondary | ICD-10-CM | POA: Diagnosis not present

## 2022-01-15 DIAGNOSIS — X500XXA Overexertion from strenuous movement or load, initial encounter: Secondary | ICD-10-CM | POA: Diagnosis not present

## 2022-01-15 DIAGNOSIS — R300 Dysuria: Secondary | ICD-10-CM | POA: Diagnosis not present

## 2022-01-15 DIAGNOSIS — M5126 Other intervertebral disc displacement, lumbar region: Secondary | ICD-10-CM | POA: Diagnosis not present

## 2022-01-15 DIAGNOSIS — Z20822 Contact with and (suspected) exposure to covid-19: Secondary | ICD-10-CM | POA: Diagnosis not present

## 2022-01-15 LAB — RESP PANEL BY RT-PCR (FLU A&B, COVID) ARPGX2
Influenza A by PCR: NEGATIVE
Influenza B by PCR: NEGATIVE
SARS Coronavirus 2 by RT PCR: NEGATIVE

## 2022-01-15 MED ORDER — NAPROXEN 500 MG PO TABS: 500.0000 mg | ORAL_TABLET | Freq: Two times a day (BID) | ORAL | 0 refills | Status: DC

## 2022-01-15 MED ORDER — CYCLOBENZAPRINE HCL 10 MG PO TABS
5.0000 mg | ORAL_TABLET | Freq: Once | ORAL | Status: AC
Start: 2022-01-15 — End: 2022-01-15
  Administered 2022-01-15: 5 mg via ORAL
  Filled 2022-01-15: qty 1

## 2022-01-15 MED ORDER — ONDANSETRON HCL 4 MG/2ML IJ SOLN
4.0000 mg | Freq: Once | INTRAMUSCULAR | Status: AC
  Administered 2022-01-15: 4 mg via INTRAVENOUS
  Filled 2022-01-15: qty 2

## 2022-01-15 MED ORDER — CYCLOBENZAPRINE HCL 10 MG PO TABS: 10.0000 mg | ORAL_TABLET | Freq: Two times a day (BID) | ORAL | 0 refills | Status: DC | PRN

## 2022-01-15 NOTE — Telephone Encounter (Signed)
Noted, sent mychart follow up  ?

## 2022-01-15 NOTE — Discharge Instructions (Addendum)
You were seen today for back pain.  Your MRI suggest a small herniated disc in the lower lumbar spine L5/S1 region.  Take naproxen twice daily for the next 3 to 5 days.  You may ultimately benefit from physical therapy.  Follow-up with your primary physician. ?

## 2022-01-15 NOTE — ED Provider Notes (Signed)
Patient presents as a transfer for MRI.  Reports her pain right now is 2 out of 10.  She does have some persistent nausea.  MRI ordered. ? ?7:02 AM ?MRI reviewed.  She has an L5 of S1 disc protrusion.  This is likely the source of the patient's pain.  No cord signal.  She remains stable and has been pain controlled in the emergency department.  Will discharge with naproxen twice daily and encouraged that she take it scheduled.  Additionally, will give Flexeril. ? ?Physical Exam  ?BP 115/82 (BP Location: Right Arm)   Pulse 70   Temp 98 ?F (36.7 ?C) (Oral)   Resp 18   Ht 1.702 m ('5\' 7"'$ )   Wt 63.5 kg   SpO2 100%   BMI 21.93 kg/m?  ? ?Physical Exam ? ?Procedures  ?Procedures ? ?ED Course / MDM  ? ?Clinical Course as of 01/15/22 0702  ?Tue Jan 14, 2022  ?2133 Patient reevaluated and noted that she still has pain.  Also notes that she has been with a shuffled gait since picking up her child earlier.  She notes that she has numbness down the back of her right lower extremity.  Also notes that she is unable to lift her right lower extremity high, not due to pain but due to the inability to do so. [SB]  ?  ?Clinical Course User Index ?[SB] Blue, Soijett A, PA-C  ? ?Medical Decision Making ?Amount and/or Complexity of Data Reviewed ?Labs: ordered. ?Radiology: ordered. ? ?Risk ?Prescription drug management. ? ? ?After history, exam, and medical workup I feel the patient has been appropriately medically screened and is safe for discharge home. Pertinent diagnoses were discussed with the patient. Patient was given return precautions. ? ?Problem List Items Addressed This Visit   ?None ?Visit Diagnoses   ? ? Acute midline low back pain, unspecified whether sciatica present    -  Primary  ? Relevant Medications  ? oxyCODONE-acetaminophen (PERCOCET/ROXICET) 5-325 MG per tablet 1 tablet (Completed)  ? methocarbamol (ROBAXIN) tablet 750 mg (Completed)  ? HYDROmorphone (DILAUDID) injection 1 mg (Completed)  ? dexamethasone  (DECADRON) injection 10 mg (Completed)  ? HYDROmorphone (DILAUDID) injection 1 mg  ? cyclobenzaprine (FLEXERIL) tablet 5 mg (Completed)  ? acetaminophen (TYLENOL) 500 MG tablet  ? ibuprofen (ADVIL) 200 MG tablet  ? naproxen (NAPROSYN) 500 MG tablet  ? cyclobenzaprine (FLEXERIL) 10 MG tablet  ? Lumbar herniated disc      ? ?  ? ? ? ? ? ? ?  ?Merryl Hacker, MD ?01/15/22 [phone] ? ?

## 2022-01-15 NOTE — ED Triage Notes (Signed)
Pt arrive from standing ED to get MRI. ?

## 2022-01-16 ENCOUNTER — Telehealth: Payer: Self-pay

## 2022-01-16 NOTE — Telephone Encounter (Signed)
Called and discussed ED visit and symptoms with patient.  She is improved but still has some right intermittent leg numbness.  Referral to orthopedics for potential evaluation of her spine and further symptoms.  Referral to PT.  Discussed that this will likely improve and early ambulation is key. ?

## 2022-01-16 NOTE — Telephone Encounter (Signed)
Transition Care Management Follow-up Telephone Call ?Date of discharge and from where: 01/15/2022 from [facility] ?How have you been since you were released from the hospital? Patient stated that she is still in some pain across her back and numbness down her leg.  ?Any questions or concerns? No ? ?Items Reviewed: ?Did the pt receive and understand the discharge instructions provided? Yes  ?Medications obtained and verified? Yes  ?Other? No  ?Any new allergies since your discharge? No  ?Dietary orders reviewed? No ?Do you have support at home? Yes  ? ?Functional Questionnaire: (I = Independent and D = Dependent) ?ADLs: I ?Bathing/Dressing- I ?Meal Prep- I ?Eating- I ?Maintaining continence- I ?Transferring/Ambulation- I ?Managing Meds- I ? ?Follow up appointments reviewed: ? ?PCP Hospital f/u appt confirmed? No   ?Specialist Hospital f/u appt confirmed? No  Physical Therapy referral and appt will be made when they call patient.  ?Are transportation arrangements needed? No  ?If their condition worsens, is the pt aware to call PCP or go to the Emergency Dept.? Yes ?Was the patient provided with contact information for the PCP's office or ED? Yes ?Was to pt encouraged to call back with questions or concerns? Yes ? ?

## 2022-01-19 MED ORDER — CYCLOBENZAPRINE HCL 10 MG PO TABS: 10.0000 mg | ORAL_TABLET | Freq: Three times a day (TID) | ORAL | 1 refills | Status: DC | PRN

## 2022-01-19 NOTE — Addendum Note (Signed)
Addended byOwens Shark, Danyele Smejkal on: 01/19/2022 03:39 PM ? ? Modules accepted: Orders ? ?

## 2022-01-21 ENCOUNTER — Ambulatory Visit: Payer: Medicaid Other | Attending: Family Medicine

## 2022-01-21 ENCOUNTER — Other Ambulatory Visit: Payer: Self-pay

## 2022-01-21 DIAGNOSIS — M5441 Lumbago with sciatica, right side: Secondary | ICD-10-CM | POA: Diagnosis not present

## 2022-01-21 DIAGNOSIS — M6281 Muscle weakness (generalized): Secondary | ICD-10-CM | POA: Diagnosis not present

## 2022-01-21 DIAGNOSIS — M5386 Other specified dorsopathies, lumbar region: Secondary | ICD-10-CM | POA: Diagnosis not present

## 2022-01-21 DIAGNOSIS — R252 Cramp and spasm: Secondary | ICD-10-CM | POA: Diagnosis not present

## 2022-01-21 DIAGNOSIS — M9905 Segmental and somatic dysfunction of pelvic region: Secondary | ICD-10-CM | POA: Diagnosis not present

## 2022-01-21 DIAGNOSIS — R279 Unspecified lack of coordination: Secondary | ICD-10-CM | POA: Diagnosis not present

## 2022-01-21 DIAGNOSIS — R293 Abnormal posture: Secondary | ICD-10-CM | POA: Insufficient documentation

## 2022-01-21 DIAGNOSIS — M9904 Segmental and somatic dysfunction of sacral region: Secondary | ICD-10-CM | POA: Diagnosis not present

## 2022-01-21 NOTE — Therapy (Signed)
?OUTPATIENT PHYSICAL THERAPY THORACOLUMBAR EVALUATION ? ? ?Patient Name: Olivia Shepard ?MRN: [id number] ?DOB:Sep 24, 1991, 31 y.o., female ?Today's Date: 01/22/2022 ? ? PT End of Session - 01/22/22 0721   ? ? Visit Number 1   ? Number of Visits 13   ? Date for PT Re-Evaluation 03/14/22   ? Authorization Type [place] MEDICAID HEALTHY BLUE. reassess  Mod Oswestry on the 6th and 11th visits   ? Progress Note Due on Visit 10   ? PT Start Time [phone]   ? PT Stop Time 0923   ? PT Time Calculation (min) 47 min   ? Activity Tolerance Patient tolerated treatment well   ? Behavior During Therapy [facility] for tasks assessed/performed   ? ?  ?  ? ?  ? ? ?Past Medical History:  ?Diagnosis Date  ? Anemia   ? Anxiety   ? GERD (gastroesophageal reflux disease)   ? Hypothyroidism   ? ?Past Surgical History:  ?Procedure Laterality Date  ? ESOPHAGEAL DILATION    ? ?Patient Active Problem List  ? Diagnosis Date Noted  ? Fatigue 08/02/2021  ? Dysphagia 11/14/2020  ? Pelvic floor dysfunction 10/02/2020  ? Varicose veins of legs, antepartum 12/19/2019  ? Plantar wart, right foot 03/18/2018  ? GERD (gastroesophageal reflux disease) 07/25/2016  ? Tinea pedis of both feet 07/25/2016  ? Hypothyroidism 07/19/2015  ? 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 Chronic UTI 10/24/2013  ? IBS (irritable bowel syndrome) 06/29/2012  ? ? ?PCP: Martyn Malay, MD ? ?REFERRING PROVIDER: Martyn Malay, MD ? ?REFERRING DIAG: Acute bilateral low back pain with right-sided sciatica ? ?THERAPY DIAG:  ?Acute bilateral low back pain with right-sided sciatica - Plan: PT plan of care cert/re-cert ? ?Cramp and spasm - Plan: PT plan of care cert/re-cert ? ?ONSET DATE: 01/14/22 ? ?SUBJECTIVE:                                                                                                                                                                                          ? ?SUBJECTIVE STATEMENT: ?Pt reports she injured her low back last week when placing her child in car  seat and experienced a sharp pain c muscle spasms. She saw a chiropractor that day who tried to help, but the pain increased with treatment and she went to the ED. Now the pain is improving, and is currently having pain in the middle of her low back. Pt notes she did have R LE numbness which resolved 2 days ago. She has been water walking which her low back tolerates, but exhausts her. Prior to the incident she reports being  very acive with exercise and a mother of 4 children ages 62-6.  ? ?PERTINENT HISTORY:  ?Anxiety, Hx of thoracic back pain ? ?PAIN:  ?Are you having pain? Yes: NPRS scale: 2/10 ?Pain location: low back ?Pain description: ache c sharp pain and constant ?Aggravating factors: large steps, bending forward  ?Relieving factors: meds, ice packs  invertor table  ?3/29 c certain movement, lifting a 1/2 gallon of milk ? ? ?PRECAUTIONS: None ? ?WEIGHT BEARING RESTRICTIONS No ? ?FALLS:  ?Has patient fallen in last 6 months? No ? ?LIVING ENVIRONMENT: ?Lives with: lives with their family ?Lives in: House/apartment ?Able to access and be mobile in home ? 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 ?PLOF: Independent ? ?PATIENT GOALS: Decrease pain and be more active with family ? ? ?OBJECTIVE:  ? ?DIAGNOSTIC FINDINGS:  ?IMPRESSION: ?1. Transitional S1 vertebra. ?2. 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 No neural impingement or visible inflammation throughout ?the lumbar spine. ?  ?  ?Electronically Signed ?  By: Jorje Guild M.D. ?  On: 01/15/2022 06:27 ? ?PATIENT SURVEYS:  ?Modified Oswestry 52% severe disability  ? ?SCREENING FOR RED FLAGS: ?Bowel or bladder incontinence: No ? ?COGNITION: ? Overall cognitive status: Within functional limits for tasks assessed   ?  ?SENSATION: ?WFL ? ?MUSCLE LENGTH: ?Hamstrings: Right WNLs  ?Thomas test: Right WNLs ? ?POSTURE:  ?Anterior rotation of the pelvis with increased lumbar lodosis  ? ?PALPATION: ?TTP of the lumbar  paraspinals ?CPAs decreased pain ? ?LUMBAR ROM:  ? ?Active  A/PROM  ?01/22/2022  ?Flexion Mod limited c LB tightness and sharp pain with return  ?Extension Markedly limited c sharp LBP  ?Right lateral flexion Min limited with L LB tightness  ?Left lateral flexion Min limited with R LP tightness   ?Right rotation Min limited c general LB tightness  ?Left rotation Min limited c general LB tightness  ? (Blank rows = not tested) ? ?LE ROM: ?  Grossly WNLs ?Active  Right ?01/22/2022 Left ?01/22/2022  ?Hip flexion    ?Hip extension    ?Hip abduction    ?Hip adduction    ?Hip internal rotation    ?Hip external rotation    ?Knee flexion    ?Knee extension    ?Ankle dorsiflexion    ?Ankle plantarflexion    ?Ankle inversion    ?Ankle eversion    ? (Blank rows = not tested) ? ?LE MMT: ?  Myotomal screen was negative c LE strength WNLs ?MMT Right ?01/22/2022 Left ?01/22/2022  ?Hip flexion    ?Hip extension    ?Hip abduction    ?Hip adduction    ?Hip internal rotation    ?Hip external rotation    ?Knee flexion    ?Knee extension    ?Ankle dorsiflexion    ?Ankle plantarflexion    ?Ankle inversion    ?Ankle eversion    ? (Blank rows = not tested) ? ?LUMBAR SPECIAL TESTS:  ?Straight leg raise test: Negative and Slump test: Negative ? ?GAIT: ?Distance walked: 100 ft within clinic ?Assistive device utilized: None ?Level of assistance: Complete Independence ?Comments: Decreased pace ? ?TODAY'S TREATMENT  ?PT was provided for directional preference assessment. Pt tolerated SKTC in supine, but mobility was limited for standing trunk flexion. Initial prone press ups were painful, but after prone on 1 then 2 pillows and pelvic stabilization with pressups, pt's lmbar ext ROM increased with improved pain. ? ? ?PATIENT EDUCATION:  ?Education details: Eval findings, POC, HEP ?Person educated:  Patient ?Education method: Explanation, Demonstration, Tactile cues, Verbal cues, and Handouts ?Education comprehension: verbalized understanding, returned  demonstration, verbal cues required, tactile cues required, and needs further education ? ? ?HOME EXERCISE PROGRAM: ?Access Code: [email or web address] ?URL: [email or web address] ?Date: 01/22/2022 ?Prepared by: Gar Ponto ? ?Pt is to progress through as tolerated prone lying to prone on pillows to prone press ups as tolerated and to complete standing back ext as tolerated periodically during the day. ? ?Exercises ?Prone Static Back Extension on Pillows - 4 x daily - 7 x weekly - 1 sets - 1 reps ?Static Prone on Elbows - 4 x daily - 7 x weekly - 1 sets - 1 reps ?Prone Press Up - 4 x daily - 7 x weekly - 3 sets - 10 reps - 2 hold ?Standing Lumbar Extension - 4 x daily - 7 x weekly - 3 sets - 10 reps - 2 hold ? ? ?ASSESSMENT: ? ?CLINICAL IMPRESSION: ?Patient is a 31 y.o. f who was seen today for physical therapy evaluation and treatment for Acute bilateral low back pain with right-sided sciatica.  ? ? ?OBJECTIVE IMPAIRMENTS difficulty walking, decreased ROM, increased muscle spasms, and pain.  ? ?ACTIVITY LIMITATIONS community activity, driving, meal prep, laundry, yard work, shopping, and care of children .  ? ?PERSONAL FACTORS Time since onset of injury/illness/exacerbation and 1 comorbidity: anxiety  are also affecting patient's functional outcome.  ? ? ?REHAB POTENTIAL: Good ? ?CLINICAL DECISION MAKING: Stable/uncomplicated ? ?EVALUATION COMPLEXITY: Low ? ? ?GOALS: ? ?SHORT TERM GOALS: Target date: 02/05/2022 ? ?Pt will be Ind in an initial HEP ?Baseline: startedon eval ?Goal status: INITIAL ? ?2.  Pt will voice understanding of measures to address low back pain ?Baseline:  ?Goal status: INITIAL ? ?LONG TERM GOALS: Target date: 03/14/22 ? ?Pt will demonstrate trunk ROM WNLs without reproduction of LBP ?Baseline: see flow sheets ?Goal status: INITIAL ? ?2.  Pt will report a decrease in LBP to 2/10 or less with daily and work activities  ?Baseline: 2-6/10 ?Goal status: INITIAL ? ?3.  Pt will demonstrate proper body  mechanic for child care, ADLs, and work related activities ?Baseline:  ?Goal status: INITIAL ? ?4.  Pt's Mod Oswestry score will improve to 20% as indication of perceived improvement ?Baseline: 52% ?Goal status: IN

## 2022-01-23 DIAGNOSIS — M9904 Segmental and somatic dysfunction of sacral region: Secondary | ICD-10-CM | POA: Diagnosis not present

## 2022-01-23 DIAGNOSIS — M5386 Other specified dorsopathies, lumbar region: Secondary | ICD-10-CM | POA: Diagnosis not present

## 2022-01-23 DIAGNOSIS — M9905 Segmental and somatic dysfunction of pelvic region: Secondary | ICD-10-CM | POA: Diagnosis not present

## 2022-01-27 DIAGNOSIS — M9905 Segmental and somatic dysfunction of pelvic region: Secondary | ICD-10-CM | POA: Diagnosis not present

## 2022-01-27 DIAGNOSIS — M5386 Other specified dorsopathies, lumbar region: Secondary | ICD-10-CM | POA: Diagnosis not present

## 2022-01-27 DIAGNOSIS — M9904 Segmental and somatic dysfunction of sacral region: Secondary | ICD-10-CM | POA: Diagnosis not present

## 2022-01-28 ENCOUNTER — Other Ambulatory Visit: Payer: Self-pay

## 2022-01-28 ENCOUNTER — Ambulatory Visit: Payer: Medicaid Other

## 2022-01-28 DIAGNOSIS — R279 Unspecified lack of coordination: Secondary | ICD-10-CM | POA: Diagnosis not present

## 2022-01-28 DIAGNOSIS — R252 Cramp and spasm: Secondary | ICD-10-CM

## 2022-01-28 DIAGNOSIS — R293 Abnormal posture: Secondary | ICD-10-CM | POA: Diagnosis not present

## 2022-01-28 DIAGNOSIS — M5441 Lumbago with sciatica, right side: Secondary | ICD-10-CM

## 2022-01-28 DIAGNOSIS — M6281 Muscle weakness (generalized): Secondary | ICD-10-CM | POA: Diagnosis not present

## 2022-01-28 NOTE — Therapy (Signed)
?OUTPATIENT PHYSICAL THERAPY TREATMENT NOTE ? ? ?Patient Name: Olivia Shepard ?MRN: [id number] ?DOB:[birth year], 31 y.o., female ?Today's Date: 01/28/2022 ? ?PCP: Martyn Malay, MD ?REFERRING PROVIDER: Martyn Malay, MD ? ? PT End of Session - 01/28/22 1543   ? ? Visit Number 2   ? Number of Visits 13   ? Date for PT Re-Evaluation 03/14/22   ? Authorization Type [place] MEDICAID HEALTHY BLUE. reassess  Mod Oswestry on the 6th and 11th visits   ? Progress Note Due on Visit 10   ? PT Start Time [phone]   ? PT Stop Time 0853   ? PT Time Calculation (min) 45 min   ? Activity Tolerance Patient tolerated treatment well   ? Behavior During Therapy [facility] for tasks assessed/performed   ? ?  ?  ? ?  ? ? ?Past Medical History:  ?Diagnosis Date  ? Anemia   ? Anxiety   ? GERD (gastroesophageal reflux disease)   ? Hypothyroidism   ? ?Past Surgical History:  ?Procedure Laterality Date  ? ESOPHAGEAL DILATION    ? ?Patient Active Problem List  ? Diagnosis Date Noted  ? Fatigue 08/02/2021  ? Dysphagia 11/14/2020  ? Pelvic floor dysfunction 10/02/2020  ? Varicose veins of legs, antepartum 12/19/2019  ? Plantar wart, right foot 03/18/2018  ? GERD (gastroesophageal reflux disease) 07/25/2016  ? Tinea pedis of both feet 07/25/2016  ? Hypothyroidism 07/19/2015  ? 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 Chronic UTI 10/24/2013  ? IBS (irritable bowel syndrome) 06/29/2012  ? ? ?REFERRING DIAG: Acute bilateral low back pain with right-sided sciatica ? ?THERAPY DIAG:  ?Acute bilateral low back pain with right-sided sciatica ? ?Cramp and spasm ? ?Muscle weakness (generalized) ? ?Abnormal posture ? ?SUBJECTIVE:                                                                                                                                                                                          ?  ?SUBJECTIVE STATEMENT: ?Pt reports her back has been feeling better. She has progressed her exs to strengthening exs. Her greatest difficulty is lifting  her 31yo who weighs 22lbs. ?  ?PAIN:  ?Are you having pain? Yes: NPRS scale: 0/10 ?Pain location: low back ?Pain description: ache c sharp pain and constant ?Aggravating factors: large steps, bending forward  ?Relieving factors: meds, ice packs  invertor table  ?4/62 c certain movement, lifting a 1/2 gallon of milk ?  ?  ?PERTINENT HISTORY:  ?Anxiety, Hx of thoracic back pain ? ?PRECAUTIONS: None ?  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  ?PATIENT GOALS: Decrease pain and be  more active with family ?  ?  ?OBJECTIVE:  ?  ?DIAGNOSTIC FINDINGS:  ?IMPRESSION: ?1. Transitional S1 vertebra. ?2. 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 No neural impingement or visible inflammation throughout ?the lumbar spine. ?  ?  ?Electronically Signed ?  By: Jorje Guild M.D. ?  On: 01/15/2022 06:27 ?  ?PATIENT SURVEYS:  ?Modified Oswestry 52% severe disability  ?  ?SCREENING FOR RED FLAGS: ?Bowel or bladder incontinence: No ?  ?COGNITION: ?          Overall cognitive status: Within functional limits for tasks assessed               ?           ?SENSATION: ?WFL ?  ?MUSCLE LENGTH: ?Hamstrings: Right WNLs  ?Thomas test: Right WNLs ?  ?POSTURE:  ?Anterior rotation of the pelvis with increased lumbar lodosis  ?  ?PALPATION: ?TTP of the lumbar paraspinals ?CPAs decreased pain ?  ?LUMBAR ROM:  ?  ?Active  A/PROM  ?01/22/2022  ?Flexion Mod limited c LB tightness and sharp pain with return  ?Extension Markedly limited c sharp LBP  ?Right lateral flexion Min limited with L LB tightness  ?Left lateral flexion Min limited with R LP tightness   ?Right rotation Min limited c general LB tightness  ?Left rotation Min limited c general LB tightness  ? (Blank rows = not tested) ?  ?LE ROM: ?                      Grossly WNLs ?Active  Right ?01/22/2022 Left ?01/22/2022  ?Hip flexion      ?Hip extension      ?Hip abduction      ?Hip adduction      ?Hip internal rotation      ?Hip external rotation      ?Knee  flexion      ?Knee extension      ?Ankle dorsiflexion      ?Ankle plantarflexion      ?Ankle inversion      ?Ankle eversion      ? (Blank rows = not tested) ?  ?LE MMT: ?                      Myotomal screen was negative c LE strength WNLs ?MMT Right ?01/22/2022 Left ?01/22/2022  ?Hip flexion      ?Hip extension      ?Hip abduction      ?Hip adduction      ?Hip internal rotation      ?Hip external rotation      ?Knee flexion      ?Knee extension      ?Ankle dorsiflexion      ?Ankle plantarflexion      ?Ankle inversion      ?Ankle eversion      ? (Blank rows = not tested) ?  ?LUMBAR SPECIAL TESTS:  ?Straight leg raise test: Negative and Slump test: Negative ?  ?GAIT: ?Distance walked: 100 ft within clinic ?Assistive device utilized: None ?Level of assistance: Complete Independence ?Comments: Decreased pace ?  ?TODAY'S TREATMENT  ?[facility] Adult PT Treatment:                                                DATE: 01/28/22 ?Therapeutic  Exercise: ?Hinged hip sit t/f standing from mat table x10 ?Hinged hip lifting 10#, x10 from 6' box and x10 from floor ?90/90 heel taps 2x10 ?Alt arm leg dead bugs 2x10 ?Bridging with PPT 2x10 ?Prone press ups x10 ?Prone alt legs  x10 ?Prone alt arm and legs x10 ?SKTC x5 10" ?Supine hamstring stretch x3 20"  ?Self Care: ?Hinged hip lifting, proper sitting posture vs. Slumped posture ? ? ?Eval Teatment: ?PT was provided for directional preference assessment. Pt tolerated SKTC in supine, but mobility was limited for standing trunk flexion. Initial prone press ups were painful, but after prone on 1 then 2 pillows and pelvic stabilization with pressups, pt's lmbar ext ROM increased with improved pain. ?  ?  ?PATIENT EDUCATION:  ?Education details: Eval findings, POC, HEP ?Person educated: Patient ?Education method: Explanation, Demonstration, Tactile cues, Verbal cues, and Handouts ?Education comprehension: verbalized understanding, returned demonstration, verbal cues required, tactile cues required,  and needs further education ?  ?  ?HOME EXERCISE PROGRAM: ?Access Code: [email or web address] ?URL: [email or web address] ?Date: 01/28/2022 ?Prepared by: Gar Ponto ? ?Exercises ?Prone Static Back Extension on Pillows - 4 x daily - 7 x weekly - 1 sets - 1 reps ?Static Prone on Elbows - 4 x daily - 7 x weekly - 1 sets - 1 reps ?Prone Press Up - 4 x daily - 7 x weekly - 3 sets - 10 reps - 2 hold ?Standing Lumbar Extension - 4 x daily - 7 x weekly - 3 sets - 10 reps - 2 hold ?Supine 90/90 Alternating Heel Touches with Posterior Pelvic Tilt - 1 x daily - 7 x weekly - 2-3 sets - 10 reps ?Supine Dead Bug with Leg Extension - 1 x daily - 7 x weekly - 2-3 sets - 10 reps ?Supine Bridge with Pelvic Floor Contraction - 1 x daily - 7 x weekly - 2-3 sets - 10 reps ?Prone Hip Extension - 1 x daily - 7 x weekly - 2-3 sets - 10 reps ?Prone Alternating Arm and Leg Lifts - 1 x daily - 7 x weekly - 2-3 sets - 10 reps ?Hooklying Single Knee to Chest Stretch - 1 x daily - 7 x weekly - 1 sets - 5 reps - 10 hold ?Supine Hamstring Stretch - 1 x daily - 7 x weekly - 1 sets - 3 reps - 30 hold ? ?  ?  ?ASSESSMENT: ?  ?CLINICAL IMPRESSION: ?Pt's low back pain is continuing to improve. PT was completed for education re: hinged hip lifting, proper sitting vs. slumped posture, and a HEP. Therex was completed with emphasis on extension biased exs for lumbar spine ROM and posterior chain strengthening. SKTC and hamstring stretch was completed with the pt supine for lumbar stability. Pt completed the hinged hip lifts correctly as well as the exs completed today. Pt demonstrates good understanding of extension biased exs for the centralization and reduction of pain. Pt tolerated today's PT session with a min increase in low back discomfort.  ?  ?OBJECTIVE IMPAIRMENTS difficulty walking, decreased ROM, increased muscle spasms, and pain.  ?  ?ACTIVITY LIMITATIONS community activity, driving, meal prep, laundry, yard work, shopping, and care of  children .  ?  ?PERSONAL FACTORS Time since onset of injury/illness/exacerbation and 1 comorbidity: anxiety  are also affecting patient's functional outcome.  ?  ?  ?GOALS: ?  ?SHORT TERM GOALS: Target date: 3/29

## 2022-01-29 DIAGNOSIS — M9905 Segmental and somatic dysfunction of pelvic region: Secondary | ICD-10-CM | POA: Diagnosis not present

## 2022-01-29 DIAGNOSIS — M5386 Other specified dorsopathies, lumbar region: Secondary | ICD-10-CM | POA: Diagnosis not present

## 2022-01-29 DIAGNOSIS — M9904 Segmental and somatic dysfunction of sacral region: Secondary | ICD-10-CM | POA: Diagnosis not present

## 2022-01-30 ENCOUNTER — Ambulatory Visit: Payer: Medicaid Other

## 2022-01-30 ENCOUNTER — Other Ambulatory Visit: Payer: Self-pay

## 2022-01-30 DIAGNOSIS — R293 Abnormal posture: Secondary | ICD-10-CM | POA: Diagnosis not present

## 2022-01-30 DIAGNOSIS — M5441 Lumbago with sciatica, right side: Secondary | ICD-10-CM

## 2022-01-30 DIAGNOSIS — R252 Cramp and spasm: Secondary | ICD-10-CM

## 2022-01-30 DIAGNOSIS — R279 Unspecified lack of coordination: Secondary | ICD-10-CM | POA: Diagnosis not present

## 2022-01-30 DIAGNOSIS — M6281 Muscle weakness (generalized): Secondary | ICD-10-CM

## 2022-01-30 NOTE — Therapy (Signed)
?OUTPATIENT PHYSICAL THERAPY TREATMENT NOTE ? ? ?Patient Name: Olivia Shepard ?MRN: [id number] ?DOB:1991-10-05, 31 y.o., female ?Today's Date: 01/30/2022 ? ?PCP: Martyn Malay, MD ?REFERRING PROVIDER: Martyn Malay, MD ? ? PT End of Session - 01/30/22 8144   ? ? Visit Number 3   ? Number of Visits 13   ? Date for PT Re-Evaluation 03/14/22   ? Authorization Type [place] MEDICAID HEALTHY BLUE. reassess  Mod Oswestry on the 6th and 11th visits   ? Progress Note Due on Visit 10   ? PT Start Time [phone]   ? PT Stop Time [phone]   ? PT Time Calculation (min) 40 min   ? Activity Tolerance Patient tolerated treatment well   ? Behavior During Therapy [facility] for tasks assessed/performed   ? ?  ?  ? ?  ? ? ? ?Past Medical History:  ?Diagnosis Date  ? Anemia   ? Anxiety   ? GERD (gastroesophageal reflux disease)   ? Hypothyroidism   ? ?Past Surgical History:  ?Procedure Laterality Date  ? ESOPHAGEAL DILATION    ? ?Patient Active Problem List  ? Diagnosis Date Noted  ? Fatigue 08/02/2021  ? Dysphagia 11/14/2020  ? Pelvic floor dysfunction 10/02/2020  ? Varicose veins of legs, antepartum 12/19/2019  ? Plantar wart, right foot 03/18/2018  ? GERD (gastroesophageal reflux disease) 07/25/2016  ? Tinea pedis of both feet 07/25/2016  ? Hypothyroidism 07/19/2015  ? 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 Chronic UTI 10/24/2013  ? IBS (irritable bowel syndrome) 06/29/2012  ? ? ?REFERRING DIAG: Acute bilateral low back pain with right-sided sciatica ? ?THERAPY DIAG:  ?Acute bilateral low back pain with right-sided sciatica ? ?Cramp and spasm ? ?Muscle weakness (generalized) ? ?Abnormal posture ? ?SUBJECTIVE:                                                                                                                                                                                          ?  ?SUBJECTIVE STATEMENT: ?Pt reports her low back is about the same as the last visit,but overall much better. Around 6 pm her low back is ore and fatigued  at a 6/10 level.  ?  ?PAIN:  ?Are you having pain? Yes: NPRS scale: 1/10, at the end of the day 6/10 ?Pain location: low back ?Pain description: ache c sharp pain and constant ?Aggravating factors: large steps, bending forward  ?Relieving factors: meds, ice packs  invertor table  ?9/70 c certain movement, lifting a 1/2 gallon of milk ?  ?  ?PERTINENT HISTORY:  ?Anxiety, Hx of thoracic back pain ? ?PRECAUTIONS: None ?  ?  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  ?PATIENT GOALS: Decrease pain and be more active with family ?  ?  ?OBJECTIVE:  ?  ?DIAGNOSTIC FINDINGS:  ?IMPRESSION: ?1. Transitional S1 vertebra. ?2. 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 No neural impingement or visible inflammation throughout ?the lumbar spine. ?  ?  ?Electronically Signed ?  By: Jorje Guild M.D. ?  On: 01/15/2022 06:27 ?  ?PATIENT SURVEYS:  ?Modified Oswestry 52% severe disability  ?  ?SCREENING FOR RED FLAGS: ?Bowel or bladder incontinence: No ?  ?COGNITION: ?          Overall cognitive status: Within functional limits for tasks assessed               ?           ?SENSATION: ?WFL ?  ?MUSCLE LENGTH: ?Hamstrings: Right WNLs  ?Thomas test: Right WNLs ?  ?POSTURE:  ?Anterior rotation of the pelvis with increased lumbar lodosis  ?  ?PALPATION: ?TTP of the lumbar paraspinals ?CPAs decreased pain ?  ?LUMBAR ROM:  ?  ?Active  A/PROM  ?01/22/2022  ?Flexion Mod limited c LB tightness and sharp pain with return  ?Extension Markedly limited c sharp LBP  ?Right lateral flexion Min limited with L LB tightness  ?Left lateral flexion Min limited with R LP tightness   ?Right rotation Min limited c general LB tightness  ?Left rotation Min limited c general LB tightness  ? (Blank rows = not tested) ?  ?LE ROM: ?                      Grossly WNLs ?Active  Right ?01/22/2022 Left ?01/22/2022  ?Hip flexion      ?Hip extension      ?Hip abduction      ?Hip adduction      ?Hip internal rotation      ?Hip external  rotation      ?Knee flexion      ?Knee extension      ?Ankle dorsiflexion      ?Ankle plantarflexion      ?Ankle inversion      ?Ankle eversion      ? (Blank rows = not tested) ?  ?LE MMT: ?                      Myotomal screen was negative c LE strength WNLs ?MMT Right ?01/22/2022 Left ?01/22/2022  ?Hip flexion      ?Hip extension      ?Hip abduction      ?Hip adduction      ?Hip internal rotation      ?Hip external rotation      ?Knee flexion      ?Knee extension      ?Ankle dorsiflexion      ?Ankle plantarflexion      ?Ankle inversion      ?Ankle eversion      ? (Blank rows = not tested) ?  ?LUMBAR SPECIAL TESTS:  ?Straight leg raise test: Negative and Slump test: Negative ?  ?GAIT: ?Distance walked: 100 ft within clinic ?Assistive device utilized: None ?Level of assistance: Complete Independence ?Comments: Decreased pace ?  ?TODAY'S TREATMENT  ?[facility] Adult PT Treatment:  DATE: 01/30/22 ?Therapeutic Exercise: ?SKTC x5 10" ?Supine hamstring stretch x3 20"  ?LTR x5 5sec ?Prone press ups x5. Then press upc PT applied pressure x5.c pressue in low back feeling better afterwards. ?Prone alt legs  x10 ?Prone alt arm and legs x10 ?Mod Superman- trunk x10 ?Bridging with PPT 2x10 ?90/90 heel taps 2x10 each ?Alt arm leg dead bugs 2x10 each ?Seated QL stretch x2, 30 sec ?Hinged hip lifting 15# 10x off 2 " box ?Updated HEP ? ?[facility] Adult PT Treatment:                                                DATE: 01/28/22 ?Therapeutic Exercise: ?Hinged hip sit t/f standing from mat table x10 ?Hinged hip lifting 10#, x10 from 6' box and x10 from floor ?90/90 heel taps 2x10 ?Alt arm leg dead bugs 2x10 ?Bridging with PPT 2x10 ?Prone press ups x10 ?Prone alt legs  x10 ?Prone alt arm and legs x10 ?SKTC x5 10" ?Supine hamstring stretch x3 20"  ?Self Care: ?Hinged hip lifting, proper sitting posture vs. Slumped posture ? ? ?Eval Teatment: ?PT was provided for directional preference assessment. Pt  tolerated SKTC in supine, but mobility was limited for standing trunk flexion. Initial prone press ups were painful, but after prone on 1 then 2 pillows and pelvic stabilization with pressups, pt's lmbar ext ROM increased with improved pain. ?  ?  ?PATIENT EDUCATION:  ?Education details: Eval findings, POC, HEP ?Person educated: Patient ?Education method: Explanation, Demonstration, Tactile cues, Verbal cues, and Handouts ?Education comprehension: verbalized understanding, returned demonstration, verbal cues required, tactile cues required, and needs further education ?  ?  ?HOME EXERCISE PROGRAM: ? ?            Access Code: [email or web address] ?URL: [email or web address] ?Date: 01/30/2022 ?Prepared by: Gar Ponto ? ?Exercises ?- Prone Static Back Extension on Pillows  - 4 x daily - 7 x weekly - 1 sets - 1 reps ?- Static Prone on Elbows  - 4 x daily - 7 x weekly - 1 sets - 1 reps ?- Prone Press Up  - 4 x daily - 7 x weekly - 3 sets - 10 reps - 2 hold ?- Standing Lumbar Extension  - 4 x daily - 7 x weekly - 3 sets - 10 reps - 2 hold ?- Supine 90/90 Alternating Heel Touches with Posterior Pelvic Tilt  - 1 x daily - 7 x weekly - 2-3 sets - 10 reps ?- Supine Dead Bug with Leg Extension  - 1 x daily - 7 x weekly - 2-3 sets - 10 reps ?- Supine Bridge with Pelvic Floor Contraction  - 1 x daily - 7 x weekly - 2-3 sets - 10 reps ?- Prone Hip Extension  - 1 x daily - 7 x weekly - 2-3 sets - 10 reps ?- Prone Alternating Arm and Leg Lifts  - 1 x daily - 7 x weekly - 2-3 sets - 10 reps ?- Hooklying Single Knee to Chest Stretch  - 1 x daily - 7 x weekly - 1 sets - 5 reps - 10 hold ?- Supine Hamstring Stretch  - 1 x daily - 7 x weekly - 1 sets - 3 reps - 30 hold ?- Seated Quadratus Lumborum Stretch in Chair  - 1 x daily - 7 x weekly - 1 sets - 3 reps -  3 hold ?- Modified Superman on Table  - 1 x daily - 7 x weekly - 1 sets - 10 reps - 3 hold ? ? ?  ?  ?ASSESSMENT: ?  ?CLINICAL IMPRESSION: ?Pt is making appropriate progress.  Low back soreness increases throughout the day, but overall much better, Low back pressure c press ups is resolved with PT overpressure on hips. PT was continued for back exs that are more extension biased. Also

## 2022-02-04 ENCOUNTER — Ambulatory Visit: Payer: Medicaid Other

## 2022-02-04 ENCOUNTER — Other Ambulatory Visit: Payer: Self-pay

## 2022-02-04 DIAGNOSIS — M6281 Muscle weakness (generalized): Secondary | ICD-10-CM

## 2022-02-04 DIAGNOSIS — R252 Cramp and spasm: Secondary | ICD-10-CM

## 2022-02-04 DIAGNOSIS — R279 Unspecified lack of coordination: Secondary | ICD-10-CM | POA: Diagnosis not present

## 2022-02-04 DIAGNOSIS — M5441 Lumbago with sciatica, right side: Secondary | ICD-10-CM | POA: Diagnosis not present

## 2022-02-04 DIAGNOSIS — R293 Abnormal posture: Secondary | ICD-10-CM | POA: Diagnosis not present

## 2022-02-04 NOTE — Therapy (Signed)
?OUTPATIENT PHYSICAL THERAPY TREATMENT NOTE ? ? ?Patient Name: Olivia Shepard ?MRN: [id number] ?DOB:Nov 05, 1991, 31 y.o., female ?Today's Date: 02/04/2022 ? ?PCP: Martyn Malay, MD ?REFERRING PROVIDER: Martyn Malay, MD ? ? PT End of Session - 02/04/22 1025   ? ? Visit Number 4   ? Number of Visits 13   ? Date for PT Re-Evaluation 03/14/22   ? Authorization Type [place] MEDICAID HEALTHY BLUE. reassess  Mod Oswestry on the 6th and 11th visits   ? PT Start Time [phone]   ? PT Stop Time 0850   ? PT Time Calculation (min) 42 min   ? Activity Tolerance Patient tolerated treatment well   ? Behavior During Therapy [facility] for tasks assessed/performed   ? ?  ?  ? ?  ? ? ? ? ?Past Medical History:  ?Diagnosis Date  ? Anemia   ? Anxiety   ? GERD (gastroesophageal reflux disease)   ? Hypothyroidism   ? ?Past Surgical History:  ?Procedure Laterality Date  ? ESOPHAGEAL DILATION    ? ?Patient Active Problem List  ? Diagnosis Date Noted  ? Fatigue 08/02/2021  ? Dysphagia 11/14/2020  ? Pelvic floor dysfunction 10/02/2020  ? Varicose veins of legs, antepartum 12/19/2019  ? Plantar wart, right foot 03/18/2018  ? GERD (gastroesophageal reflux disease) 07/25/2016  ? Tinea pedis of both feet 07/25/2016  ? Hypothyroidism 07/19/2015  ? 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 Chronic UTI 10/24/2013  ? IBS (irritable bowel syndrome) 06/29/2012  ? ? ?REFERRING DIAG: Acute bilateral low back pain with right-sided sciatica ? ?THERAPY DIAG:  ?Acute bilateral low back pain with right-sided sciatica ? ?Cramp and spasm ? ?Muscle weakness (generalized) ? ?SUBJECTIVE:                                                                                                                                                                                          ?  ?SUBJECTIVE STATEMENT: ?Pt reports continued improvement. 2 days over the weekend were pain free. With return to work last night, her LBP pain was 4/10 and she felt good about how well her tolerated.  ?   ?PAIN:  ?Are you having pain? Yes: NPRS scale: 4/10, at the end of work day 6/10 ?Pain location: low back ?Pain description: ache , intermittent ?Aggravating factors: work ?Relieving factors: meds, ice packs  invertor table, therex ?  ?PERTINENT HISTORY:  ?Anxiety, Hx of thoracic back pain ? ?PRECAUTIONS: None ?  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  ?PATIENT GOALS: Decrease pain and be more active with family ?  ?  ?OBJECTIVE:  ?  ?DIAGNOSTIC FINDINGS:  ?  IMPRESSION: ?1. Transitional S1 vertebra. ?2. 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 No neural impingement or visible inflammation throughout ?the lumbar spine. ?  ?  ?Electronically Signed ?  By: Jorje Guild M.D. ?  On: 01/15/2022 06:27 ?  ?PATIENT SURVEYS:  ?Modified Oswestry 52% severe disability  ?  ?SCREENING FOR RED FLAGS: ?Bowel or bladder incontinence: No ?  ?COGNITION: ?          Overall cognitive status: Within functional limits for tasks assessed               ?           ?SENSATION: ?WFL ?  ?MUSCLE LENGTH: ?Hamstrings: Right WNLs  ?Thomas test: Right WNLs ?  ?POSTURE:  ?Anterior rotation of the pelvis with increased lumbar lodosis  ?  ?PALPATION: ?TTP of the lumbar paraspinals ?CPAs decreased pain ?  ?LUMBAR ROM:  ?  ?Active  A/PROM  ?01/22/2022  ?Flexion Mod limited c LB tightness and sharp pain with return  ?Extension Markedly limited c sharp LBP  ?Right lateral flexion Min limited with L LB tightness  ?Left lateral flexion Min limited with R LP tightness   ?Right rotation Min limited c general LB tightness  ?Left rotation Min limited c general LB tightness  ? (Blank rows = not tested) ?  ?LE ROM: ?                      Grossly WNLs ?Active  Right ?01/22/2022 Left ?01/22/2022  ?Hip flexion      ?Hip extension      ?Hip abduction      ?Hip adduction      ?Hip internal rotation      ?Hip external rotation      ?Knee flexion      ?Knee extension      ?Ankle dorsiflexion      ?Ankle plantarflexion       ?Ankle inversion      ?Ankle eversion      ? (Blank rows = not tested) ?  ?LE MMT: ?                      Myotomal screen was negative c LE strength WNLs ?MMT Right ?01/22/2022 Left ?01/22/2022  ?Hip flexion      ?Hip extension      ?Hip abduction      ?Hip adduction      ?Hip internal rotation      ?Hip external rotation      ?Knee flexion      ?Knee extension      ?Ankle dorsiflexion      ?Ankle plantarflexion      ?Ankle inversion      ?Ankle eversion      ? (Blank rows = not tested) ?  ?LUMBAR SPECIAL TESTS:  ?Straight leg raise test: Negative and Slump test: Negative ?  ?GAIT: ?Distance walked: 100 ft within clinic ?Assistive device utilized: None ?Level of assistance: Complete Independence ?Comments: Decreased pace ?  ?TODAY'S TREATMENT  ?[facility] Adult PT Treatment:                                                DATE: 02/04/22 ?Therapeutic Exercise: ?Prone press ups x5. Then press upc PT applied pressure x5.c pressue in low  back feeling better afterwards. ?[place] x2 20" ?Piriformis x1 20" ?QL seated and standing x1, 20' each ex and each sid ?Prone alt arm and legs x10 ?Mod Superman- trunk x10 ?Bridging with PPT 2x10 ?90/90 heel taps 2x10 each ?Alt arm leg dead bugs 2x10 each ?Updated HEP ?Therapeutic Activity: ?Waist to shoulder 5# lifting 2x10, feet offset to simulate work related activity ?Hinged hip lifting c 25# KB from 4" platform 2x10 ? ? ?[facility] Adult PT Treatment:                                                DATE: 01/30/22 ?Therapeutic Exercise: ?SKTC x5 10" ?Supine hamstring stretch x3 20"  ?LTR x5 5sec ?Prone press ups x5. Then press upc PT applied pressure x5.c pressue in low back feeling better afterwards. ?Prone alt legs  x10 ?Prone alt arm and legs x10 ?Mod Superman- trunk x10 ?Bridging with PPT 2x10 ?90/90 heel taps 1x10 each ?Alt arm leg dead bugs 1x10 each ?Seated QL stretch x2, 30 sec ?Hinged hip lifting 15# 10x off 2 " box ?Updated HEP ? ?[facility] Adult PT Treatment:                                                 DATE: 01/28/22 ?Therapeutic Exercise: ?Hinged hip sit t/f standing from mat table x10 ?Hinged hip lifting 10#, x10 from 6' box and x10 from floor ?90/90 heel taps 2x10 ?Alt arm leg dead bugs 2x10 ?Bridging with PPT 2x10 ?Prone press ups x10 ?Prone alt legs  x10 ?Prone alt arm and legs x10 ?SKTC x5 10" ?Supine hamstring stretch x3 20"  ?Self Care: ?Hinged hip lifting, proper sitting posture vs. Slumped posture ?  ?  ?PATIENT EDUCATION:  ?Education details: Eval findings, POC, HEP ?Person educated: Patient ?Education method: Explanation, Demonstration, Tactile cues, Verbal cues, and Handouts ?Education comprehension: verbalized understanding, returned demonstration, verbal cues required, tactile cues required, and needs further education ?  ?  ?HOME EXERCISE PROGRAM: ? ?Access Code: [email or web address] ?URL: [email or web address] ?Date: 02/04/2022 ?Prepared by: Gar Ponto ? ?Exercises ?- Prone Static Back Extension on Pillows  - 4 x daily - 7 x weekly - 1 sets - 1 reps ?- Static Prone on Elbows  - 4 x daily - 7 x weekly - 1 sets - 1 reps ?- Prone Press Up  - 4 x daily - 7 x weekly - 3 sets - 10 reps - 2 hold ?- Standing Lumbar Extension  - 4 x daily - 7 x weekly - 3 sets - 10 reps - 2 hold ?- Supine 90/90 Alternating Heel Touches with Posterior Pelvic Tilt  - 1 x daily - 7 x weekly - 2-3 sets - 10 reps ?- Supine Dead Bug with Leg Extension  - 1 x daily - 7 x weekly - 2-3 sets - 10 reps ?- Supine Bridge with Pelvic Floor Contraction  - 1 x daily - 7 x weekly - 2-3 sets - 10 reps ?- Prone Hip Extension  - 1 x daily - 7 x weekly - 2-3 sets - 10 reps ?- Prone Alternating Arm and Leg Lifts  - 1 x daily - 7 x weekly - 2-3 sets - 10 reps ?- Hooklying Single Knee to  Chest Stretch  - 1 x daily - 7 x weekly - 1 sets - 5 reps - 10 hold ?- Supine Hamstring Stretch  - 1 x daily - 7 x weekly - 1 sets - 3 reps - 30 hold ?- Seated Quadratus Lumborum Stretch in Chair  - 1 x daily - 7 x weekly - 1 sets - 3 reps - 3 hold ?-  Modified Superman on Table  - 1 x daily - 7 x weekly - 1 sets - 10 reps - 3 hold ?- Standing Quadratus Lumborum Stretch with Doorway  - 1 x daily - 7 x weekly - 1 sets - 3 reps - 15 hold ?- Supine Piriform

## 2022-02-06 NOTE — Therapy (Signed)
?OUTPATIENT PHYSICAL THERAPY TREATMENT NOTE ? ? ?Patient Name: Olivia Shepard ?MRN: [id number] ?DOB:Jun 08, 1991, 31 y.o., female ?Today's Date: 02/07/2022 ? ?PCP: Martyn Malay, MD ?REFERRING PROVIDER: Martyn Malay, MD ? ? PT End of Session - 02/07/22 1660   ? ? Visit Number 5   ? Number of Visits 13   ? Date for PT Re-Evaluation 03/14/22   ? Authorization Type [place] MEDICAID HEALTHY BLUE. reassess  Mod Oswestry on the 6th and 11th visits   ? Authorization Time Period 3/16 to 4/28   ? PT Start Time [phone]   ? PT Stop Time 0853   ? PT Time Calculation (min) 46 min   ? Activity Tolerance Patient tolerated treatment well   ? Behavior During Therapy [facility] for tasks assessed/performed   ? ?  ?  ? ?  ? ? ? ? ? ?Past Medical History:  ?Diagnosis Date  ? Anemia   ? Anxiety   ? GERD (gastroesophageal reflux disease)   ? Hypothyroidism   ? ?Past Surgical History:  ?Procedure Laterality Date  ? ESOPHAGEAL DILATION    ? ?Patient Active Problem List  ? Diagnosis Date Noted  ? Fatigue 08/02/2021  ? Dysphagia 11/14/2020  ? Pelvic floor dysfunction 10/02/2020  ? Varicose veins of legs, antepartum 12/19/2019  ? Plantar wart, right foot 03/18/2018  ? GERD (gastroesophageal reflux disease) 07/25/2016  ? Tinea pedis of both feet 07/25/2016  ? Hypothyroidism 07/19/2015  ? 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 Chronic UTI 10/24/2013  ? IBS (irritable bowel syndrome) 06/29/2012  ? ? ?REFERRING DIAG: Acute bilateral low back pain with right-sided sciatica ? ?THERAPY DIAG:  ?Acute bilateral low back pain with right-sided sciatica ? ?Cramp and spasm ? ?Muscle weakness (generalized) ? ?Abnormal posture ? ?Unspecified lack of coordination ? ?SUBJECTIVE:                                                                                                                                                                                          ?  ?SUBJECTIVE STATEMENT: ? Did my exercises yesterday, no pain. Pt has a routine and does not vary a whole  lot.  ? ?PAIN:  ?Are you having pain? Yes: NPRS scale: 4/10, at the end of work day 6/10 ?Pain location: low back ?Pain description: ache , intermittent ?Aggravating factors: work ?Relieving factors: meds, ice packs  invertor table, therex ?  ?PERTINENT HISTORY:  ?Anxiety, Hx of thoracic back pain ? ?PRECAUTIONS: None ?  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  ?PATIENT GOALS: Decrease pain and be more active with family ?  ?  ?OBJECTIVE:  ?  ?  DIAGNOSTIC FINDINGS:  ?IMPRESSION: ?1. Transitional S1 vertebra. ?2. 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 No neural impingement or visible inflammation throughout ?the lumbar spine. ?  ?  ?Electronically Signed ?  By: Jorje Guild M.D. ?  On: 01/15/2022 06:27 ?  ?PATIENT SURVEYS:  ?Modified Oswestry 52% severe disability  ?  ?SCREENING FOR RED FLAGS: ?Bowel or bladder incontinence: No ?  ?COGNITION: ?          Overall cognitive status: Within functional limits for tasks assessed               ?           ?SENSATION: ?WFL ?  ?MUSCLE LENGTH: ?Hamstrings: Right WNLs  ?Thomas test: Right WNLs ?  ?POSTURE:  ?Anterior rotation of the pelvis with increased lumbar lodosis  ?  ?PALPATION: ?TTP of the lumbar paraspinals ?CPAs decreased pain ?  ?LUMBAR ROM:  ?  ?Active  A/PROM  ?01/22/2022 AROM  ?  ?Flexion Mod limited c LB tightness and sharp pain with return   ?Extension Markedly limited c sharp LBP   ?Right lateral flexion Min limited with L LB tightness   ?Left lateral flexion Min limited with R LP tightness    ?Right rotation Min limited c general LB tightness   ?Left rotation Min limited c general LB tightness   ? (Blank rows = not tested) ?  ?LE ROM: ?                      Grossly WNLs ?Active  Right ?01/22/2022 Left ?01/22/2022  ?Hip flexion      ?Hip extension      ?Hip abduction      ?Hip adduction      ?Hip internal rotation      ?Hip external rotation      ?Knee flexion      ?Knee extension      ?Ankle dorsiflexion      ?Ankle  plantarflexion      ?Ankle inversion      ?Ankle eversion      ? (Blank rows = not tested) ?  ?LE MMT: ?                      Myotomal screen was negative c LE strength WNLs ?MMT Right ?01/22/2022 Left ?01/22/2022  ?Hip flexion      ?Hip extension      ?Hip abduction      ?Hip adduction      ?Hip internal rotation      ?Hip external rotation      ?Knee flexion      ?Knee extension      ?Ankle dorsiflexion      ?Ankle plantarflexion      ?Ankle inversion      ?Ankle eversion      ? (Blank rows = not tested) ?  ?LUMBAR SPECIAL TESTS:  ?Straight leg raise test: Negative and Slump test: Negative ?  ?GAIT: ?Distance walked: 100 ft within clinic ?Assistive device utilized: None ?Level of assistance: Complete Independence ?Comments: Decreased pace ?  ?TODAY'S TREATMENT  ? ? ?[facility] Adult PT Treatment:                                                DATE: 02/07/22 ?Therapeutic Exercise: ?  Elliptical 5 min L10 ramp, L 1 resist.  ?Prone press up x 10  ?Prone hip extension x 10 alternating  ?Cobra x 10  ?Swimmers x 10 each side  ?Quadruped childs pose for release ?Quadruped hip ext  x 10  ?Reformer pulling straps 1 red x 10, quadruped 1 blue UE pressing, added  LE extension with long box on the floor  ?Reformer quadruped 1 blue UE x 10 then combo to plank x 10  ?Self Care: ?Oswestry: next visit  ?Pilates ?Progress, POC  ?  ?[facility] Adult PT Treatment:                                                DATE: 02/04/22 ?Therapeutic Exercise: ?Prone press ups x5. Then press upc PT applied pressure x5.c pressue in low back feeling better afterwards. ?[place] x2 20" ?Piriformis x1 20" ?QL seated and standing x1, 20' each ex and each sid ?Prone alt arm and legs x10 ?Mod Superman- trunk x10 ?Bridging with PPT 2x10 ?90/90 heel taps 2x10 each ?Alt arm leg dead bugs 2x10 each ?Updated HEP ?Therapeutic Activity: ?Waist to shoulder 5# lifting 2x10, feet offset to simulate work related activity ?Hinged hip lifting c 25# KB from 4" platform 2x10 ? ? ?[facility]  Adult PT Treatment:                                                DATE: 01/30/22 ?Therapeutic Exercise: ?SKTC x5 10" ?Supine hamstring stretch x3 20"  ?LTR x5 5sec ?Prone press ups x5. Then press upc PT applied pressure x5.c pressue in low back feeling better afterwards. ?Prone alt legs  x10 ?Prone alt arm and legs x10 ?Mod Superman- trunk x10 ?Bridging with PPT 2x10 ?90/90 heel taps 1x10 each ?Alt arm leg dead bugs 1x10 each ?Seated QL stretch x2, 30 sec ?Hinged hip lifting 15# 10x off 2 " box ?Updated HEP ? ?  ?  ?PATIENT EDUCATION:  ?Education details: Eval findings, POC, HEP ?Person educated: Patient ?Education method: Explanation, Demonstration, Tactile cues, Verbal cues, and Handouts ?Education comprehension: verbalized understanding, returned demonstration, verbal cues required, tactile cues required, and needs further education ?  ?  ?HOME EXERCISE PROGRAM: ? ?Access Code: [email or web address] ?URL: [email or web address] ?Date: 02/04/2022 ?Prepared by: Gar Ponto ? ?Exercises ?- Prone Static Back Extension on Pillows  - 4 x daily - 7 x weekly - 1 sets - 1 reps ?- Static Prone on Elbows  - 4 x daily - 7 x weekly - 1 sets - 1 reps ?- Prone Press Up  - 4 x daily - 7 x weekly - 3 sets - 10 reps - 2 hold ?- Standing Lumbar Extension  - 4 x daily - 7 x weekly - 3 sets - 10 reps - 2 hold ?- Supine 90/90 Alternating Heel Touches with Posterior Pelvic Tilt  - 1 x daily - 7 x weekly - 2-3 sets - 10 reps ?- Supine Dead Bug with Leg Extension  - 1 x daily - 7 x weekly - 2-3 sets - 10 reps ?- Supine Bridge with Pelvic Floor Contraction  - 1 x daily - 7 x weekly - 2-3 sets - 10 reps ?- Prone Hip Extension  - 1 x daily -  7 x weekly - 2-3 sets - 10 reps ?- Prone Alternating Arm and Leg Lifts  - 1 x daily - 7 x weekly - 2-3 sets - 10 reps ?- Hooklying Single Knee to Chest Stretch  - 1 x daily - 7 x weekly - 1 sets - 5 reps - 10 hold ?- Supine Hamstring Stretch  - 1 x daily - 7 x weekly - 1 sets - 3 reps - 30 hold ?-  Seated Quadratus Lumborum Stretch in Chair  - 1 x daily - 7 x weekly - 1 sets - 3 reps - 3 hold ?- Modified Superman on Table  - 1 x daily - 7 x weekly - 1 sets - 10 reps - 3 hold ?- Standing Quadratus Lumborum

## 2022-02-07 ENCOUNTER — Encounter: Payer: Self-pay | Admitting: Orthopaedic Surgery

## 2022-02-07 ENCOUNTER — Ambulatory Visit: Payer: Medicaid Other | Admitting: Physical Therapy

## 2022-02-07 ENCOUNTER — Encounter: Payer: Self-pay | Admitting: Physical Therapy

## 2022-02-07 ENCOUNTER — Ambulatory Visit: Payer: Medicaid Other | Admitting: Orthopaedic Surgery

## 2022-02-07 DIAGNOSIS — R252 Cramp and spasm: Secondary | ICD-10-CM | POA: Diagnosis not present

## 2022-02-07 DIAGNOSIS — R293 Abnormal posture: Secondary | ICD-10-CM

## 2022-02-07 DIAGNOSIS — M5136 Other intervertebral disc degeneration, lumbar region: Secondary | ICD-10-CM

## 2022-02-07 DIAGNOSIS — M5441 Lumbago with sciatica, right side: Secondary | ICD-10-CM

## 2022-02-07 DIAGNOSIS — M6281 Muscle weakness (generalized): Secondary | ICD-10-CM

## 2022-02-07 DIAGNOSIS — R279 Unspecified lack of coordination: Secondary | ICD-10-CM

## 2022-02-07 NOTE — Progress Notes (Signed)
? ?Office Visit Note ?  ?Patient: Olivia Shepard           ?Date of Birth: 1991-08-29           ?MRN: [id number] ?Visit Date: 02/07/2022 ?             ?Requested by: Martyn Malay, MD ?[address] ?[place],  [place] [address] ?PCP: Martyn Malay, MD ? ? ?Assessment & Plan: ?Visit Diagnoses:  ?1. Other intervertebral disc degeneration, lumbar region   ? ? ?Plan: We will check patient back if she has increased symptoms.  We reviewed her MRI scan with her images and report.  Small area high intensity.  Currently her radicular symptoms are significantly improved.  Follow-up if she has recurrence.  We discussed the epidural as an option. ? ?Follow-Up Instructions: Return if symptoms worsen or fail to improve.  ? ?Orders:  ?No orders of the defined types were placed in this encounter. ? ?No orders of the defined types were placed in this encounter. ? ? ? ? Procedures: ?No procedures performed ? ? ?Clinical Data: ?No additional findings. ? ? ?Subjective: ?Chief Complaint  ?Patient presents with  ? Lower Back - Pain  ? ? ?HPI 31 year old female new patient with low back pain.  Date of injury was 01/13/2022 when she was putting a child in a car seat states her back locked up and she could barely lift her right leg.  She was treated with a chiropractor had increased pain and went to emergency room.  She is back doing some water aerobics and therapy now states her pain is significantly better.  She states chiropractor told her she had an old injury with numbness and tingling originally in her leg which is resolved.  She is here today with her 4 children.  Past history of GERD hypothyroidism IBS UTIs. ? ?MRI scan 01/15/2022 showed disc degeneration L5-S1 annular fissure small protrusion. ? ?Review of Systems all other systems noncontributory to HPI. ? ? ?Objective: ?Vital Signs: BP 98/66   Pulse (!) 101   Ht '5\' 7"'$  (1.702 m)   Wt 140 lb (63.5 kg)   BMI 21.93 kg/m?  ? ?Physical Exam ?Constitutional:   ?   Appearance: She is  well-developed.  ?HENT:  ?   Head: Normocephalic.  ?   Right Ear: External ear normal.  ?   Left Ear: External ear normal. There is no impacted cerumen.  ?Eyes:  ?   Pupils: Pupils are equal, round, and reactive to light.  ?Neck:  ?   Thyroid: No thyromegaly.  ?   Trachea: No tracheal deviation.  ?Cardiovascular:  ?   Rate and Rhythm: Normal rate.  ?Pulmonary:  ?   Effort: Pulmonary effort is normal.  ?Abdominal:  ?   Palpations: Abdomen is soft.  ?Musculoskeletal:  ?   Cervical back: No rigidity.  ?Skin: ?   General: Skin is warm and dry.  ?Neurological:  ?   Mental Status: She is alert and oriented to person, place, and time.  ?Psychiatric:     ?   Behavior: Behavior normal.  ? ? ?Ortho Exam patient has negative logroll's.  Mild sciatic notch tenderness on the right negative on the left no trochanteric bursal tenderness.  Stable walk on heels and toes gets on and off the exam table easily.  No focal weakness no nerve root tension signs. ? ?Specialty Comments:  ?No specialty comments available. ? ?Imaging: ?Narrative & Impression  ?CLINICAL DATA:  Lower back  pain.  Trauma with leg weakness ?  ?EXAM: ?MRI LUMBAR SPINE WITHOUT CONTRAST ?  ?TECHNIQUE: ?Multiplanar, multisequence MR imaging of the lumbar spine was ?performed. No intravenous contrast was administered. ?  ?COMPARISON:  Lumbar spine CT from yesterday ?  ?FINDINGS: ?Segmentation:  Transitional S1 vertebra as previously numbered. ?  ?Alignment:  Physiologic. ?  ?Vertebrae:  No fracture, evidence of discitis, or bone lesion. ?  ?Conus medullaris and cauda equina: Conus extends to the L1 level. ?Conus and cauda equina appear normal. ?  ?Paraspinal and other soft tissues: Negative for perispinal mass or ?inflammation ?  ?Disc levels: ?  ?L5-S1 focal disc desiccation and mild narrowing with annular fissure ?and bulging. Small central disc protrusion. ?  ?Widely patent canal and foramina. ?  ?IMPRESSION: ?1. Transitional S1 vertebra. ?2. Focal disc degeneration  at L5-S1 with annular fissure and small ?protrusion. No neural impingement or visible inflammation throughout ?the lumbar spine. ?  ?  ?Electronically Signed ?  By: Jorje Guild M.D. ?  On: 01/15/2022 06:27 ?   ? ? ? ?PMFS History: ?Patient Active Problem List  ? Diagnosis Date Noted  ? Other intervertebral disc degeneration, lumbar region 02/09/2022  ? Fatigue 08/02/2021  ? Dysphagia 11/14/2020  ? Pelvic floor dysfunction 10/02/2020  ? Varicose veins of legs, antepartum 12/19/2019  ? Plantar wart, right foot 03/18/2018  ? GERD (gastroesophageal reflux disease) 07/25/2016  ? Tinea pedis of both feet 07/25/2016  ? Hypothyroidism 07/19/2015  ? 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 Chronic UTI 10/24/2013  ? IBS (irritable bowel syndrome) 06/29/2012  ? ?Past Medical History:  ?Diagnosis Date  ? Anemia   ? Anxiety   ? GERD (gastroesophageal reflux disease)   ? Hypothyroidism   ?  ?Family History  ?Problem Relation Age of Onset  ? Diabetes Mother   ? Atrial fibrillation Father   ? Prostate cancer Father   ? Lung cancer Maternal Grandmother   ? Colon cancer Paternal Great-grandfather   ? Stomach cancer Paternal Great-grandfather   ? Colon cancer Paternal Great-grandmother   ? Esophageal cancer Neg Hx   ?  ?Past Surgical History:  ?Procedure Laterality Date  ? ESOPHAGEAL DILATION    ? ?Social History  ? ?Occupational History  ? Not on file  ?Tobacco Use  ? Smoking status: Never  ? Smokeless tobacco: Never  ?Vaping Use  ? Vaping Use: Never used  ?Substance and Sexual Activity  ? Alcohol use: No  ?  Alcohol/week: 0.0 standard drinks  ? Drug use: No  ? Sexual activity: Yes  ?  Birth control/protection: None  ? ? ? ? ? ? ?

## 2022-02-09 DIAGNOSIS — M5136 Other intervertebral disc degeneration, lumbar region: Secondary | ICD-10-CM | POA: Insufficient documentation

## 2022-02-09 DIAGNOSIS — M51369 Other intervertebral disc degeneration, lumbar region without mention of lumbar back pain or lower extremity pain: Secondary | ICD-10-CM | POA: Insufficient documentation

## 2022-02-11 ENCOUNTER — Ambulatory Visit: Payer: Medicaid Other | Attending: Family Medicine | Admitting: Physical Therapy

## 2022-02-11 ENCOUNTER — Encounter: Payer: Self-pay | Admitting: Physical Therapy

## 2022-02-11 DIAGNOSIS — R293 Abnormal posture: Secondary | ICD-10-CM | POA: Diagnosis not present

## 2022-02-11 DIAGNOSIS — R279 Unspecified lack of coordination: Secondary | ICD-10-CM | POA: Insufficient documentation

## 2022-02-11 DIAGNOSIS — M6281 Muscle weakness (generalized): Secondary | ICD-10-CM | POA: Insufficient documentation

## 2022-02-11 DIAGNOSIS — R252 Cramp and spasm: Secondary | ICD-10-CM | POA: Diagnosis not present

## 2022-02-11 DIAGNOSIS — M5441 Lumbago with sciatica, right side: Secondary | ICD-10-CM | POA: Insufficient documentation

## 2022-02-11 NOTE — Therapy (Signed)
?OUTPATIENT PHYSICAL THERAPY TREATMENT NOTE ? ? ?Patient Name: Olivia Shepard ?MRN: [id number] ?DOB:1991/01/31, 31 y.o., female ?Today's Date: 02/11/2022 ? ?PCP: Martyn Malay, MD ?REFERRING PROVIDER: Martyn Malay, MD ? ? PT End of Session - 02/11/22 1324   ? ? Visit Number 6   ? Number of Visits 13   ? Date for PT Re-Evaluation 03/14/22   ? Authorization Type [place] MEDICAID HEALTHY BLUE. reassess  Mod Oswestry on the 6th and 11th visits   ? Authorization Time Period 3/16 to 4/28   ? PT Start Time [phone]   ? PT Stop Time 0845   ? PT Time Calculation (min) 29 min   ? Activity Tolerance Patient tolerated treatment well   ? Behavior During Therapy [facility] for tasks assessed/performed   ? ?  ?  ? ?  ? ? ? ? ? ? ?Past Medical History:  ?Diagnosis Date  ? Anemia   ? Anxiety   ? GERD (gastroesophageal reflux disease)   ? Hypothyroidism   ? ?Past Surgical History:  ?Procedure Laterality Date  ? ESOPHAGEAL DILATION    ? ?Patient Active Problem List  ? Diagnosis Date Noted  ? Other intervertebral disc degeneration, lumbar region 02/09/2022  ? Fatigue 08/02/2021  ? Dysphagia 11/14/2020  ? Pelvic floor dysfunction 10/02/2020  ? Varicose veins of legs, antepartum 12/19/2019  ? Plantar wart, right foot 03/18/2018  ? GERD (gastroesophageal reflux disease) 07/25/2016  ? Tinea pedis of both feet 07/25/2016  ? Hypothyroidism 07/19/2015  ? 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 Chronic UTI 10/24/2013  ? IBS (irritable bowel syndrome) 06/29/2012  ? ? ?REFERRING DIAG: Acute bilateral low back pain with right-sided sciatica ? ?THERAPY DIAG:  ?Acute bilateral low back pain with right-sided sciatica ? ?Cramp and spasm ? ?Muscle weakness (generalized) ? ?Abnormal posture ? ?Unspecified lack of coordination ? ?SUBJECTIVE:                                                                                                                                                                                          ?  ?SUBJECTIVE STATEMENT: ? Pt late  due to traffic.  No pain.  Saw Dr. Lorin Mercy  , don't need to go back to him unless I have "excruciating" pain .  ? ?PAIN:  ?Are you having pain? Yes: NPRS scale: 0/10, at the end of day none  ?Pain location: low back ?Pain description: ache , intermittent ?Aggravating factors: work ?Relieving factors: meds, ice packs  invertor table, therex ?  ?PERTINENT HISTORY:  ?Anxiety, Hx of thoracic back pain ? ?PRECAUTIONS: None ?  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  ?  PATIENT GOALS: Decrease pain and be more active with family ?  ?  ?OBJECTIVE:  ?  ?DIAGNOSTIC FINDINGS:  ?IMPRESSION: ?1. Transitional S1 vertebra. ?2. 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 No neural impingement or visible inflammation throughout ?the lumbar spine. ?  ?  ?Electronically Signed ?  By: Jorje Guild M.D. ?  On: 01/15/2022 06:27 ?  ?PATIENT SURVEYS:  ?Modified Oswestry 52% severe disability  ?  ?SCREENING FOR RED FLAGS: ?Bowel or bladder incontinence: No ?  ?COGNITION: ?          Overall cognitive status: Within functional limits for tasks assessed               ?           ?SENSATION: ?WFL ?  ?MUSCLE LENGTH: ?Hamstrings: Right WNLs  ?Thomas test: Right WNLs ?  ?POSTURE:  ?Anterior rotation of the pelvis with increased lumbar lodosis  ?  ?PALPATION: ?TTP of the lumbar paraspinals ?CPAs decreased pain ?  ?LUMBAR ROM:  ?  ?Active  A/PROM  ?01/22/2022 AROM  ?  ?Flexion Mod limited c LB tightness and sharp pain with return   ?Extension Markedly limited c sharp LBP   ?Right lateral flexion Min limited with L LB tightness   ?Left lateral flexion Min limited with R LP tightness    ?Right rotation Min limited c general LB tightness   ?Left rotation Min limited c general LB tightness   ? (Blank rows = not tested) ?  ?LE ROM: ?                      Grossly WNLs ?Active  Right ?01/22/2022 Left ?01/22/2022  ?Hip flexion      ?Hip extension      ?Hip abduction      ?Hip adduction      ?Hip internal rotation       ?Hip external rotation      ?Knee flexion      ?Knee extension      ?Ankle dorsiflexion      ?Ankle plantarflexion      ?Ankle inversion      ?Ankle eversion      ? (Blank rows = not tested) ?  ?LE MMT: ?                      Myotomal screen was negative c LE strength WNLs ?MMT Right ?01/22/2022 Left ?01/22/2022  ?Hip flexion      ?Hip extension      ?Hip abduction      ?Hip adduction      ?Hip internal rotation      ?Hip external rotation      ?Knee flexion      ?Knee extension      ?Ankle dorsiflexion      ?Ankle plantarflexion      ?Ankle inversion      ?Ankle eversion      ? (Blank rows = not tested) ?  ?LUMBAR SPECIAL TESTS:  ?Straight leg raise test: Negative and Slump test: Negative ?  ?GAIT: ?Distance walked: 100 ft within clinic ?Assistive device utilized: None ?Level of assistance: Complete Independence ?Comments: Decreased pace ?  ?TODAY'S TREATMENT  ? ?[facility] Adult PT Treatment:  DATE: 02/11/22 ?Therapeutic Exercise: ?Reformer ?Footwork 2 red 1 green parallel and turnout on heels and toes ?Double and single leg  ?Bridging 2 red 1 blue x 10  ?Feet in Straps  1 red 1 yellow Arcs in parallel and turnout , circles each direction  ?Arm arcs 1 red 1 yellow x 10 , added knee extension ?Trunk rotation, knees to chest  ? ?[facility] Adult PT Treatment:                                                DATE: 02/07/22 ?Therapeutic Exercise: ?Elliptical 5 min L10 ramp, L 1 resist.  ?Prone press up x 10  ?Prone hip extension x 10 alternating  ?Cobra x 10  ?Swimmers x 10 each side  ?Quadruped childs pose for release ?Quadruped hip ext  x 10  ?Reformer pulling straps 1 red x 10, quadruped 1 blue UE pressing, added  LE extension with long box on the floor  ?Reformer quadruped 1 blue UE x 10 then combo to plank x 10  ?Self Care: ?Oswestry: next visit  ?Pilates ?Progress, POC  ?  ?[facility] Adult PT Treatment:                                                DATE: 02/04/22 ?Therapeutic  Exercise: ?Prone press ups x5. Then press upc PT applied pressure x5.c pressue in low back feeling better afterwards. ?[place] x2 20" ?Piriformis x1 20" ?QL seated and standing x1, 20' each ex and each sid ?Prone alt arm and legs x10 ?Mod Superman- trunk x10 ?Bridging with PPT 2x10 ?90/90 heel taps 2x10 each ?Alt arm leg dead bugs 2x10 each ?Updated HEP ?Therapeutic Activity: ?Waist to shoulder 5# lifting 2x10, feet offset to simulate work related activity ?Hinged hip lifting c 25# KB from 4" platform 2x10 ? ? ?[facility] Adult PT Treatment:                                                DATE: 01/30/22 ?Therapeutic Exercise: ?SKTC x5 10" ?Supine hamstring stretch x3 20"  ?LTR x5 5sec ?Prone press ups x5. Then press upc PT applied pressure x5.c pressue in low back feeling better afterwards. ?Prone alt legs  x10 ?Prone alt arm and legs x10 ?Mod Superman- trunk x10 ?Bridging with PPT 2x10 ?90/90 heel taps 1x10 each ?Alt arm leg dead bugs 1x10 each ?Seated QL stretch x2, 30 sec ?Hinged hip lifting 15# 10x off 2 " box ?Updated HEP ? ?  ?  ?PATIENT EDUCATION:  ?Education details: Eval findings, POC, HEP ?Person educated: Patient ?Education method: Explanation, Demonstration, Tactile cues, Verbal cues, and Handouts ?Education comprehension: verbalized understanding, returned demonstration, verbal cues required, tactile cues required, and needs further education ?  ?  ?HOME EXERCISE PROGRAM: ? ?Access Code: [email or web address] ?URL: [email or web address] ?Date: 02/04/2022 ?Prepared by: Gar Ponto ? ?Exercises ?- Prone Static Back Extension on Pillows  - 4 x daily - 7 x weekly - 1 sets - 1 reps ?- Static Prone on Elbows  - 4 x daily - 7 x weekly - 1 sets - 1 reps ?-  Prone Press Up  - 4 x daily - 7 x weekly - 3 sets - 10 reps - 2 hold ?- Standing Lumbar Extension  - 4 x daily - 7 x weekly - 3 sets - 10 reps - 2 hold ?- Supine 90/90 Alternating Heel Touches with Posterior Pelvic Tilt  - 1 x daily - 7 x weekly - 2-3 sets - 10 reps ?-  Supine Dead Bug with Leg Extension  - 1 x daily - 7 x weekly - 2-3 sets - 10 reps ?- Supine Bridge with Pelvic Floor Contraction  - 1 x daily - 7 x weekly - 2-3 sets - 10 reps ?- Prone Hip Extension  - 1 x daily - 7 x

## 2022-02-13 ENCOUNTER — Ambulatory Visit: Payer: Medicaid Other

## 2022-02-25 ENCOUNTER — Encounter: Payer: Self-pay | Admitting: Physical Therapy

## 2022-02-25 ENCOUNTER — Ambulatory Visit: Payer: Medicaid Other | Admitting: Physical Therapy

## 2022-02-25 DIAGNOSIS — R252 Cramp and spasm: Secondary | ICD-10-CM

## 2022-02-25 DIAGNOSIS — R293 Abnormal posture: Secondary | ICD-10-CM

## 2022-02-25 DIAGNOSIS — R279 Unspecified lack of coordination: Secondary | ICD-10-CM

## 2022-02-25 DIAGNOSIS — M5441 Lumbago with sciatica, right side: Secondary | ICD-10-CM | POA: Diagnosis not present

## 2022-02-25 DIAGNOSIS — M6281 Muscle weakness (generalized): Secondary | ICD-10-CM | POA: Diagnosis not present

## 2022-02-25 NOTE — Therapy (Signed)
?OUTPATIENT PHYSICAL THERAPY TREATMENT NOTE ? ? ?Patient Name: Olivia Shepard ?MRN: [id number] ?DOB:[birth year], 31 y.o., female ?Today's Date: 02/25/2022 ? ?PCP: Martyn Malay, MD ?REFERRING PROVIDER: Martyn Malay, MD ? ? PT End of Session - 02/25/22 0805   ? ? Visit Number 7   ? Number of Visits 13   ? Date for PT Re-Evaluation 03/14/22   ? Authorization Type  MEDICAID HEALTHY BLUE. reassess  Mod Oswestry on the 6th and 11th visits   ? Authorization Time Period 3/16 to 4/28   ? Progress Note Due on Visit 10   ? PT Start Time [phone]   ? PT Stop Time 7425   ? PT Time Calculation (min) 42 min   ? Activity Tolerance Patient tolerated treatment well   ? Behavior During Therapy [facility] for tasks assessed/performed   ? ?  ?  ? ?  ? ? ? ? ? ? ? ?Past Medical History:  ?Diagnosis Date  ? Anemia   ? Anxiety   ? GERD (gastroesophageal reflux disease)   ? Hypothyroidism   ? ?Past Surgical History:  ?Procedure Laterality Date  ? ESOPHAGEAL DILATION    ? ?Patient Active Problem List  ? Diagnosis Date Noted  ? Other intervertebral disc degeneration, lumbar region 02/09/2022  ? Fatigue 08/02/2021  ? Dysphagia 11/14/2020  ? Pelvic floor dysfunction 10/02/2020  ? Varicose veins of legs, antepartum 12/19/2019  ? Plantar wart, right foot 03/18/2018  ? GERD (gastroesophageal reflux disease) 07/25/2016  ? Tinea pedis of both feet 07/25/2016  ? Hypothyroidism 07/19/2015  ? 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 Chronic UTI 10/24/2013  ? IBS (irritable bowel syndrome) 06/29/2012  ? ? ?REFERRING DIAG: Acute bilateral low back pain with right-sided sciatica ? ?THERAPY DIAG:  ?Acute bilateral low back pain with right-sided sciatica ? ?Cramp and spasm ? ?Muscle weakness (generalized) ? ?Abnormal posture ? ?Unspecified lack of coordination ? ?SUBJECTIVE:                                                                                                                                                                                          ?   ?SUBJECTIVE STATEMENT: ?I did 3-4 online classes last week and felt good.  Friday started to have some pain low back and Rt side.  Its 1/0 , was also at the beach and that may have been it. Otherwise it has been really good.  ? ? ?PAIN:  ?Are you having pain? Yes: NPRS scale: 1/10, at the end of day none  ?Pain location: low back ?Pain description: ache , intermittent ?Aggravating factors: work ?Relieving factors: meds, ice  packs  invertor table, therex ?  ?PERTINENT HISTORY:  ?Anxiety, Hx of thoracic back pain ? ?PRECAUTIONS: None ?  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  ?PATIENT GOALS: Decrease pain and be more active with family ?  ?  ?OBJECTIVE:  ?  ?DIAGNOSTIC FINDINGS:  ?IMPRESSION: ?1. Transitional S1 vertebra. ?2. 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 No neural impingement or visible inflammation throughout ?the lumbar spine. ?  ?  ?Electronically Signed ?  By: Jorje Guild M.D. ?  On: 01/15/2022 06:27 ?  ?PATIENT SURVEYS:  ?Modified Oswestry 52% severe disability  ?  ?SCREENING FOR RED FLAGS: ?Bowel or bladder incontinence: No ?  ?COGNITION: ?          Overall cognitive status: Within functional limits for tasks assessed               ?           ?SENSATION: ?WFL ?  ?MUSCLE LENGTH: ?Hamstrings: Right WNLs  ?Thomas test: Right WNLs ?  ?POSTURE:  ?Anterior rotation of the pelvis with increased lumbar lodosis  ?  ?PALPATION: ?TTP of the lumbar paraspinals ?CPAs decreased pain ?  ?LUMBAR ROM:  ?  ?Active  A/PROM  ?01/22/2022 AROM  ?02/25/22  ?Flexion Mod limited c LB tightness and sharp pain with return Fingertips to floor  ?Extension Markedly limited c sharp LBP WNL  ?Right lateral flexion Min limited with L LB tightness WNL, pressure with added extension  ?Left lateral flexion Min limited with R LP tightness  WNL  ?Right rotation Min limited c general LB tightness WNL  ?Lt rotation  Min limited c general LB tightness WNL  ? (Blank rows = not tested) ?  ?  ?LE  MMT: ?                      Myotomal screen was negative c LE strength WNLs ?MMT Right ?02/25/22 Left ?02/25/22  ?Hip flexion  4/5  4/5  ?Hip extension      ?Hip abduction      ?Hip adduction      ?Hip internal rotation      ?Hip external rotation      ?Knee flexion  5/5  5/5  ?Knee extension  5/5  5/5  ?Ankle dorsiflexion      ?Ankle plantarflexion      ?Ankle inversion      ?Ankle eversion      ? (Blank rows = not tested) ?  ?LUMBAR SPECIAL TESTS:  ?Straight leg raise test: Negative and Slump test: Negative ?  ?GAIT: ?Distance walked: 100 ft within clinic ?Assistive device utilized: None ?Level of assistance: Complete Independence ?Comments: Decreased pace ?  ?TODAY'S TREATMENT  ? ? ?[facility] Adult PT Treatment:                                                DATE: 02/25/22 ?Therapeutic Exercise: ? Elliptical 5 min L2 resist and L 10 ramp  ?Pilates Tower for LE/Core strength, postural strength, lumbopelvic disassociation and core control.  Exercises included: ? ?Supine [place] 1 yellow arcs, parallel and turnout x 10  ?Leg circles x 8 each direction  ?Double leg and single leg  ?Squats (frog) x  10 , cues to avoid lumbar flexion  added knee extension at 90 deg hip flex  ? ?  Sidelying Leg Springs purple springs  ?  Hip abduction/adduction x 10  ?  Sidekicks (flex/ext) x 10  ?  Small circles x 8 each direction ? ?Arm [place] Yellow Arcs x 10 with ball squeeze added bridge x 10  ?Arm arcs with SLR x 6 each side  ?Self Care: ?POC and Pilates, strength    training needed for functional lifting and mobility   ? ?[facility] Adult PT Treatment:                                                DATE: 02/11/22 ?Therapeutic Exercise: ?Reformer ?Footwork 2 red 1 green parallel and turnout on heels and toes ?Double and single leg  ?Bridging 2 red 1 blue x 10  ?Feet in Straps  1 red 1 yellow Arcs in parallel and turnout , circles each direction  ?Arm arcs 1 red 1 yellow x 10 , added knee extension ?Trunk rotation, knees to chest  ? ?[facility] Adult  PT Treatment:                                                DATE: 02/07/22 ?Therapeutic Exercise: ?Elliptical 5 min L10 ramp, L 1 resist.  ?Prone press up x 10  ?Prone hip extension x 10 alternating  ?Cobra x 10  ?Swimmers x 10 each side  ?Quadruped childs pose for release ?Quadruped hip ext  x 10  ?Reformer pulling straps 1 red x 10, quadruped 1 blue UE pressing, added  LE extension with long box on the floor  ?Reformer quadruped 1 blue UE x 10 then combo to plank x 10  ?Self Care: ?Oswestry: next visit  ?Pilates ?Progress, POC  ?  ?[facility] Adult PT Treatment:                                                DATE: 02/04/22 ?Therapeutic Exercise: ?Prone press ups x5. Then press upc PT applied pressure x5.c pressue in low back feeling better afterwards. ?[place] x2 20" ?Piriformis x1 20" ?QL seated and standing x1, 20' each ex and each sid ?Prone alt arm and legs x10 ?Mod Superman- trunk x10 ?Bridging with PPT 2x10 ?90/90 heel taps 2x10 each ?Alt arm leg dead bugs 2x10 each ?Updated HEP ?Therapeutic Activity: ?Waist to shoulder 5# lifting 2x10, feet offset to simulate work related activity ?Hinged hip lifting c 25# KB from 4" platform 2x10 ? ? ?[facility] Adult PT Treatment:                                                DATE: 01/30/22 ?Therapeutic Exercise: ?SKTC x5 10" ?Supine hamstring stretch x3 20"  ?LTR x5 5sec ?Prone press ups x5. Then press upc PT applied pressure x5.c pressue in low back feeling better afterwards. ?Prone alt legs  x10 ?Prone alt arm and legs x10 ?Mod Superman- trunk x10 ?Bridging with PPT 2x10 ?90/90 heel taps 1x10 each ?Alt arm leg dead bugs 1x10 each ?Seated  QL stretch x2, 30 sec ?Hinged hip lifting 15# 10x off 2 " box ?Updated HEP ? ?  ?  ?PATIENT EDUCATION:  ?Education details: Eval findings, POC, HEP ?Person educated: Patient ?Education method: Explanation, Demonstration, Tactile cues, Verbal cues, and Handouts ?Education comprehension: verbalized understanding, returned demonstration, verbal cues  required, tactile cues required, and needs further education ?  ?  ?HOME EXERCISE PROGRAM: ? ?Access Code: [email or web address] ?URL: [email or web address] ?Date: 02/04/2022 ?Prepared by: Gar Ponto

## 2022-02-27 ENCOUNTER — Ambulatory Visit: Payer: Medicaid Other | Admitting: Physical Therapy

## 2022-02-27 ENCOUNTER — Encounter: Payer: Self-pay | Admitting: Physical Therapy

## 2022-02-27 DIAGNOSIS — M5441 Lumbago with sciatica, right side: Secondary | ICD-10-CM | POA: Diagnosis not present

## 2022-02-27 DIAGNOSIS — M6281 Muscle weakness (generalized): Secondary | ICD-10-CM

## 2022-02-27 DIAGNOSIS — E039 Hypothyroidism, unspecified: Secondary | ICD-10-CM | POA: Diagnosis not present

## 2022-02-27 DIAGNOSIS — R293 Abnormal posture: Secondary | ICD-10-CM

## 2022-02-27 DIAGNOSIS — R279 Unspecified lack of coordination: Secondary | ICD-10-CM | POA: Diagnosis not present

## 2022-02-27 DIAGNOSIS — R252 Cramp and spasm: Secondary | ICD-10-CM | POA: Diagnosis not present

## 2022-02-27 DIAGNOSIS — Z3201 Encounter for pregnancy test, result positive: Secondary | ICD-10-CM | POA: Diagnosis not present

## 2022-02-27 DIAGNOSIS — Z8759 Personal history of other complications of pregnancy, childbirth and the puerperium: Secondary | ICD-10-CM | POA: Diagnosis not present

## 2022-02-27 NOTE — Therapy (Signed)
?OUTPATIENT PHYSICAL THERAPY TREATMENT NOTE ? ? ?Patient Name: Olivia Shepard ?MRN: [id number] ?DOB:1991-05-18, 31 y.o., female ?Today's Date: 02/27/2022 ? ?PCP: Martyn Malay, MD ?REFERRING PROVIDER: Martyn Malay, MD ? ? PT End of Session - 02/27/22 0854   ? ? Visit Number 8   ? Number of Visits 13   ? Date for PT Re-Evaluation 03/14/22   ? Authorization Type [place] MEDICAID HEALTHY BLUE. reassess  Mod Oswestry on the 6th and 11th visits   ? Authorization Time Period 3/16 to 4/28   ? PT Start Time [phone]   ? PT Stop Time 0930   ? PT Time Calculation (min) 40 min   ? Activity Tolerance Patient tolerated treatment well   ? Behavior During Therapy [facility] for tasks assessed/performed   ? ?  ?  ? ?  ? ? ? ? ? ? ? ?Past Medical History:  ?Diagnosis Date  ? Anemia   ? Anxiety   ? GERD (gastroesophageal reflux disease)   ? Hypothyroidism   ? ?Past Surgical History:  ?Procedure Laterality Date  ? ESOPHAGEAL DILATION    ? ?Patient Active Problem List  ? Diagnosis Date Noted  ? Other intervertebral disc degeneration, lumbar region 02/09/2022  ? Fatigue 08/02/2021  ? Dysphagia 11/14/2020  ? Pelvic floor dysfunction 10/02/2020  ? Varicose veins of legs, antepartum 12/19/2019  ? Plantar wart, right foot 03/18/2018  ? GERD (gastroesophageal reflux disease) 07/25/2016  ? Tinea pedis of both feet 07/25/2016  ? Hypothyroidism 07/19/2015  ? 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 Chronic UTI 10/24/2013  ? IBS (irritable bowel syndrome) 06/29/2012  ? ? ?REFERRING DIAG: Acute bilateral low back pain with right-sided sciatica ? ?THERAPY DIAG:  ?Acute bilateral low back pain with right-sided sciatica ? ?Cramp and spasm ? ?Muscle weakness (generalized) ? ?Abnormal posture ? ?Unspecified lack of coordination ? ?SUBJECTIVE:                                                                                                                                                                                         ?No pain , same soreness , very mild  across hips and sacrum . The hardest thing is when I have to lift my 31 yr old and hold him all day.  He weighs 20 lbs. Did not exercise yesterday.  ? ? ?PAIN:  ?Are you having pain? Yes: NPRS scale: 1/10, at the end of day none  ?Pain location: low back ?Pain description: ache , intermittent ?Aggravating factors: work ?Relieving factors: meds, ice packs  invertor table, therex ?  ?PERTINENT HISTORY:  ?Anxiety, Hx of thoracic back pain ? ?  PRECAUTIONS: None ?  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  ?PATIENT GOALS: Decrease pain and be more active with family ?  ?  ?OBJECTIVE:  ?  ?DIAGNOSTIC FINDINGS:  ?IMPRESSION: ?1. Transitional S1 vertebra. ?2. 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 No neural impingement or visible inflammation throughout ?the lumbar spine. ?  ?  ?Electronically Signed ?  By: Jorje Guild M.D. ?  On: 01/15/2022 06:27 ?  ?PATIENT SURVEYS:  ?Modified Oswestry 52% severe disability  ?  ?SCREENING FOR RED FLAGS: ?Bowel or bladder incontinence: No ?  ?COGNITION: ?          Overall cognitive status: Within functional limits for tasks assessed               ?           ?SENSATION: ?WFL ?  ?MUSCLE LENGTH: ?Hamstrings: Right WNLs  ?Thomas test: Right WNLs ?  ?POSTURE:  ?Anterior rotation of the pelvis with increased lumbar lodosis  ?  ?PALPATION: ?TTP of the lumbar paraspinals ?CPAs decreased pain ?  ?LUMBAR ROM:  ?  ?Active  A/PROM  ?01/22/2022 AROM  ?02/25/22  ?Flexion Mod limited c LB tightness and sharp pain with return Fingertips to floor  ?Extension Markedly limited c sharp LBP WNL  ?Right lateral flexion Min limited with L LB tightness WNL, pressure with added extension  ?Left lateral flexion Min limited with R LP tightness  WNL  ?Right rotation Min limited c general LB tightness WNL  ?Lt rotation  Min limited c general LB tightness WNL  ? (Blank rows = not tested) ?  ?  ?LE MMT: ?                      Myotomal screen was negative c LE strength  WNLs ?MMT Right ?02/25/22 Left ?02/25/22  ?Hip flexion  4/5  4/5  ?Hip extension      ?Hip abduction      ?Hip adduction      ?Hip internal rotation      ?Hip external rotation      ?Knee flexion  5/5  5/5  ?Knee extension  5/5  5/5  ?Ankle dorsiflexion      ?Ankle plantarflexion      ?Ankle inversion      ?Ankle eversion      ? (Blank rows = not tested) ?  ?LUMBAR SPECIAL TESTS:  ?Straight leg raise test: Negative and Slump test: Negative ?  ?GAIT: ?Distance walked: 100 ft within clinic ?Assistive device utilized: None ?Level of assistance: Complete Independence ?Comments: Decreased pace ?  ?TODAY'S TREATMENT  ? ?[facility] Adult PT Treatment:                                                DATE: 02/27/22 ?Therapeutic Exercise: ?Recumbent bike 5 min L2 , cramping ant Rt hip, had to stretch and eventually hip popped and it felt better.   ?Sit to stand 10 lbs x 10  ?Added chest press x 10 lbs x 10  ?Squatting 10 lbs x 15, then 15 lbs x 15  ?Hip hinging dowel ?Dead lift partial 10 lbs x  2 sets ?Singel leg hip hinge with and without UE asst ?Step ups (reverse) 8 inch x 10 each side  ?Step ups (reverse) with 10 lbs wgt ?Glider  lunge no wgt.  ?Glute med drops off step x 20 ?Hip abduction with 1 UE x 15 Rt LE  ? ? ?[facility] Adult PT Treatment:                                                DATE: 02/25/22 ?Therapeutic Exercise: ? Elliptical 5 min L2 resist and L 10 ramp  ?Pilates Tower for LE/Core strength, postural strength, lumbopelvic disassociation and core control.  Exercises included: ? ?Supine Leg [place] 1 yellow arcs, parallel and turnout x 10  ?Leg circles x 8 each direction  ?Double leg and single leg  ?Squats (frog) x  10 , cues to avoid lumbar flexion  added knee extension at 90 deg hip flex  ? ?Sidelying Leg [place] purple [place]  ?  Hip abduction/adduction x 10  ?  Sidekicks (flex/ext) x 10  ?  Small circles x 8 each direction ? ?Arm [place] Yellow Arcs x 10 with ball squeeze added bridge x 10  ?Arm arcs with SLR x 6  each side  ?Self Care: ?POC and Pilates, strength    training needed for functional lifting and mobility   ? ?[facility] Adult PT Treatment:                                                DATE: 02/11/22 ?Therapeutic Exercise: ?Reformer ?Footwork 2 red 1 green parallel and turnout on heels and toes ?Double and single leg  ?Bridging 2 red 1 blue x 10  ?Feet in Straps  1 red 1 yellow Arcs in parallel and turnout , circles each direction  ?Arm arcs 1 red 1 yellow x 10 , added knee extension ?Trunk rotation, knees to chest  ? ?[facility] Adult PT Treatment:                                                DATE: 02/07/22 ?Therapeutic Exercise: ?Elliptical 5 min L10 ramp, L 1 resist.  ?Prone press up x 10  ?Prone hip extension x 10 alternating  ?Cobra x 10  ?Swimmers x 10 each side  ?Quadruped childs pose for release ?Quadruped hip ext  x 10  ?Reformer pulling straps 1 red x 10, quadruped 1 blue UE pressing, added  LE extension with long box on the floor  ?Reformer quadruped 1 blue UE x 10 then combo to plank x 10  ?Self Care: ?Oswestry: next visit  ?Pilates ?Progress, POC  ?  ?[facility] Adult PT Treatment:                                                DATE: 02/04/22 ?Therapeutic Exercise: ?Prone press ups x5. Then press upc PT applied pressure x5.c pressue in low back feeling better afterwards. ?[place] x2 20" ?Piriformis x1 20" ?QL seated and standing x1, 20' each ex and each sid ?Prone alt arm and legs x10 ?Mod Superman- trunk x10 ?Bridging with PPT 2x10 ?90/90 heel taps 2x10 each ?Alt  arm leg dead bugs 2x10 each ?Updated HEP ?Therapeutic Activity: ?Waist to shoulder 5# lifting 2x10, feet offset to simulate work related activity ?Hinged hip lifting c 25# KB from 4" platform 2x10 ? ? ?[facility] Adult PT Treatment:                                                DATE: 01/30/22 ?Therapeutic Exercise: ?SKTC x5 10" ?Supine hamstring stretch x3 20"  ?LTR x5 5sec ?Prone press ups x5. Then press upc PT applied pressure x5.c pressue in low back feeling better  afterwards. ?Prone alt legs  x10 ?Prone alt arm and legs x10 ?Mod Superman- trunk x10 ?Bridging with PPT 2x10 ?90/90 heel taps 1x10 each ?Alt arm leg dead bugs 1x10 each ?Seated QL stretch x2, 30 sec ?Hinged hi

## 2022-03-02 NOTE — Therapy (Signed)
?OUTPATIENT PHYSICAL THERAPY TREATMENT NOTE ? ? ?Patient Name: Olivia Shepard ?MRN: [id number] ?DOB:1991/05/20, 31 y.o., female ?Today's Date: 03/03/2022 ? ?PCP: Martyn Malay, MD ?REFERRING PROVIDER: Martyn Malay, MD ? ? PT End of Session - 03/03/22 0746   ? ? Visit Number 9   ? Number of Visits 13   ? Date for PT Re-Evaluation 03/14/22   ? Authorization Type [place] MEDICAID HEALTHY BLUE. reassess  Mod Oswestry on the 6th and 11th visits   ? Authorization Time Period 3/16 to 4/28   ? PT Start Time 0800   ? PT Stop Time 0845   ? PT Time Calculation (min) 45 min   ? Activity Tolerance Patient tolerated treatment well   ? Behavior During Therapy [facility] for tasks assessed/performed   ? ?  ?  ? ?  ? ? ? ? ? ? ? ? ?Past Medical History:  ?Diagnosis Date  ? Anemia   ? Anxiety   ? GERD (gastroesophageal reflux disease)   ? Hypothyroidism   ? ?Past Surgical History:  ?Procedure Laterality Date  ? ESOPHAGEAL DILATION    ? ?Patient Active Problem List  ? Diagnosis Date Noted  ? Other intervertebral disc degeneration, lumbar region 02/09/2022  ? Fatigue 08/02/2021  ? Dysphagia 11/14/2020  ? Pelvic floor dysfunction 10/02/2020  ? Varicose veins of legs, antepartum 12/19/2019  ? Plantar wart, right foot 03/18/2018  ? GERD (gastroesophageal reflux disease) 07/25/2016  ? Tinea pedis of both feet 07/25/2016  ? Hypothyroidism 07/19/2015  ? 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 Chronic UTI 10/24/2013  ? IBS (irritable bowel syndrome) 06/29/2012  ? ? ?REFERRING DIAG: Acute bilateral low back pain with right-sided sciatica ? ?THERAPY DIAG:  ?Acute bilateral low back pain with right-sided sciatica ? ?Cramp and spasm ? ?Muscle weakness (generalized) ? ?Abnormal posture ? ?Unspecified lack of coordination ? ?SUBJECTIVE:                                                                                                                                                                 This weekend (Sunday) I was in the NICU and couldn't  figure out how to help a mom with breastfeeding.  Pt had to lean and twist for about an hour at a time.  Was really sore last night.   ?      ?PAIN:  ?Are you having pain? Yes: NPRS scale: 2/10 ?Pain location: low back ?Pain description: ache , intermittent ?Aggravating factors: work ?Relieving factors: meds, ice packs  invertor table, therex ?  ?PERTINENT HISTORY:  ?Anxiety, Hx of thoracic back pain ? ?PRECAUTIONS: None ?  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  ?PATIENT GOALS: Decrease pain and be more active with family ?  ?  ?OBJECTIVE:  ?  ?  DIAGNOSTIC FINDINGS:  ?IMPRESSION: ?1. Transitional S1 vertebra. ?2. 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 No neural impingement or visible inflammation throughout ?the lumbar spine. ?  ?  ?Electronically Signed ?  By: Jorje Guild M.D. ?  On: 01/15/2022 06:27 ?  ?PATIENT SURVEYS:  ?Modified Oswestry 52% severe disability  ?  ?SCREENING FOR RED FLAGS: ?Bowel or bladder incontinence: No ?  ?COGNITION: ?          Overall cognitive status: Within functional limits for tasks assessed               ?           ?SENSATION: ?WFL ?  ?MUSCLE LENGTH: ?Hamstrings: Right WNLs  ?Thomas test: Right WNLs ?  ?POSTURE:  ?Anterior rotation of the pelvis with increased lumbar lordosis  ?  ?PALPATION: ?TTP of the lumbar paraspinals ?CPAs decreased pain ?  ?LUMBAR ROM:  ?  ?Active  A/PROM  ?01/22/2022 AROM  ?02/25/22  ?Flexion Mod limited c LB tightness and sharp pain with return Fingertips to floor  ?Extension Markedly limited c sharp LBP WNL  ?Right lateral flexion Min limited with L LB tightness WNL, pressure with added extension  ?Left lateral flexion Min limited with R LP tightness  WNL  ?Right rotation Min limited c general LB tightness WNL  ?Lt rotation  Min limited c general LB tightness WNL  ? (Blank rows = not tested) ?  ?  ?LE MMT: ?                      Myotomal screen was negative c LE strength WNLs ?MMT Right ?02/25/22 Left ?02/25/22   ?Hip flexion  4/5  4/5  ?Hip extension      ?Hip abduction      ?Hip adduction      ?Hip internal rotation      ?Hip external rotation      ?Knee flexion  5/5  5/5  ?Knee extension  5/5  5/5  ?Ankle dorsiflexion      ?Ankle plantarflexion      ?Ankle inversion      ?Ankle eversion      ? (Blank rows = not tested) ?  ?LUMBAR SPECIAL TESTS:  ?Straight leg raise test: Negative and Slump test: Negative ?  ?GAIT: ?Distance walked: 100 ft within clinic ?Assistive device utilized: None ?Level of assistance: Complete Independence ?Comments: Decreased pace ? ?[facility] Adult PT Treatment:                                                DATE: 03/03/22 ?Self care:  ? Options for posture, leaning, using feet apart, kneeling (1/2) and switching sides.  ? ?Therapeutic Exercise: ?Mat level stretching: bridging x 10 , lower trunk rotation x 10 and dead bug x 10 each UE and LE  ?Pilates Reformer used for LE/core strength, postural strength, lumbopelvic disassociation and core control.  Exercises included: ?Feet in Straps ?Sidelying (S/L) 1 red hip flex/ext x 10 ?S/L hip flex/ext x 10  ?Circles  ?Quadruped  1 red LE and the added arms  ?Facing back elbows on short box, lower abs with hip flexion ?Long box Prone ?Overhead press 1 red x 10  ?Added hip extension and then shoulder flexion (swimming)  ?Luiz Blare /extension 1 red spring  ?Seated Arms/Abd ?1  Red hinge x 8, then 1 blue hinge ?1 blue horizontal abduction (arms extended) ?1 red 1 yellow x 10 goal post arms ? ?[facility] Adult PT Treatment:                                                DATE: 02/27/22 ?Therapeutic Exercise: ?Recumbent bike 5 min L2 , cramping ant Rt hip, had to stretch and eventually hip popped and it felt better.   ?Sit to stand 10 lbs x 10  ?Added chest press x 10 lbs x 10  ?Squatting 10 lbs x 15, then 15 lbs x 15  ?Hip hinging dowel ?Dead lift partial 10 lbs x  2 sets ?Singel leg hip hinge with and without UE asst ?Step ups (reverse) 8 inch x 10 each side  ?Step ups (reverse)  with 10 lbs wgt ?Glider lunge no wgt.  ?Glute med drops off step x 20 ?Hip abduction with 1 UE x 15 Rt LE  ? ? ?[place] Adult PT Treatment:                                                DATE: 02/25/22 ?Therapeutic Exercise: ? Elliptical 5 min L2 resist and L 10 ramp  ?Pilates Tower for LE/Core strength, postural strength, lumbopelvic disassociation and core control.  Exercises included: ? ?Supine Leg [place] 1 yellow arcs, parallel and turnout x 10  ?Leg circles x 8 each direction  ?Double leg and single leg  ?Squats (frog) x  10 , cues to avoid lumbar flexion  added knee extension at 90 deg hip flex  ? ?Sidelying Leg Springs purple springs  ?  Hip abduction/adduction x 10  ?  Sidekicks (flex/ext) x 10  ?  Small circles x 8 each direction ? ?Arm Springs Yellow Arcs x 10 with ball squeeze added bridge x 10  ?Arm arcs with SLR x 6 each side  ?Self Care: ?POC and Pilates, strength    training needed for functional lifting and mobility   ? ?  ?  ?PATIENT EDUCATION:  ?Education details: spine extension v flexion , neutral core work  ?Person educated: Patient ?Education method: Explanation, Demonstration, Tactile cues, Verbal cues, and Handouts ?Education comprehension: verbalized understanding, returned demonstration, verbal cues required, tactile cues required, and needs further education ?  ?  ?HOME EXERCISE PROGRAM: ? ?Access Code: [email or web address] ?URL: [email or web address] ?Date: 02/04/2022 ?Prepared by: Gar Ponto ? ?Exercises ?- Prone Static Back Extension on Pillows  - 4 x daily - 7 x weekly - 1 sets - 1 reps ?- Static Prone on Elbows  - 4 x daily - 7 x weekly - 1 sets - 1 reps ?- Prone Press Up  - 4 x daily - 7 x weekly - 3 sets - 10 reps - 2 hold ?- Standing Lumbar Extension  - 4 x daily - 7 x weekly - 3 sets - 10 reps - 2 hold ?- Supine 90/90 Alternating Heel Touches with Posterior Pelvic Tilt  - 1 x daily - 7 x weekly - 2-3 sets - 10 reps ?- Supine Dead Bug with Leg Extension  - 1 x daily - 7 x weekly -  2-3 sets - 10 reps ?- Supine Bridge with Pelvic  Floor Contraction  - 1 x daily - 7 x weekly - 2-3 sets - 10 reps ?- Prone Hip Extension  - 1 x daily - 7 x weekly - 2-3 sets - 10 reps ?- Prone Alternating Arm

## 2022-03-03 ENCOUNTER — Ambulatory Visit: Payer: Medicaid Other | Admitting: Physical Therapy

## 2022-03-03 DIAGNOSIS — M6281 Muscle weakness (generalized): Secondary | ICD-10-CM

## 2022-03-03 DIAGNOSIS — M5441 Lumbago with sciatica, right side: Secondary | ICD-10-CM

## 2022-03-03 DIAGNOSIS — R279 Unspecified lack of coordination: Secondary | ICD-10-CM | POA: Diagnosis not present

## 2022-03-03 DIAGNOSIS — R293 Abnormal posture: Secondary | ICD-10-CM

## 2022-03-03 DIAGNOSIS — R252 Cramp and spasm: Secondary | ICD-10-CM

## 2022-03-03 LAB — TSH: TSH: 2.29 (ref 0.41–5.90)

## 2022-03-07 ENCOUNTER — Ambulatory Visit: Payer: Medicaid Other | Admitting: Physical Therapy

## 2022-03-07 DIAGNOSIS — R252 Cramp and spasm: Secondary | ICD-10-CM

## 2022-03-07 DIAGNOSIS — M5441 Lumbago with sciatica, right side: Secondary | ICD-10-CM | POA: Diagnosis not present

## 2022-03-07 DIAGNOSIS — R279 Unspecified lack of coordination: Secondary | ICD-10-CM

## 2022-03-07 DIAGNOSIS — R293 Abnormal posture: Secondary | ICD-10-CM | POA: Diagnosis not present

## 2022-03-07 DIAGNOSIS — M6281 Muscle weakness (generalized): Secondary | ICD-10-CM

## 2022-03-07 NOTE — Therapy (Signed)
?OUTPATIENT PHYSICAL THERAPY TREATMENT NOTE ? ? ?Patient Name: Olivia Shepard ?MRN: [id number] ?DOB:07/04/1991, 31 y.o., female ?Today's Date: 03/07/2022 ? ?PCP: Martyn Malay, MD ?REFERRING PROVIDER: Martyn Malay, MD ? ? PT End of Session - 03/07/22 0931   ? ? Visit Number 10   ? Number of Visits 13   ? Date for PT Re-Evaluation 03/14/22   ? Authorization Type [place] MEDICAID HEALTHY BLUE. reassess  Mod Oswestry on the 6th and 11th visits   ? Authorization Time Period 3/16 to 4/28   ? PT Start Time 0845   ? PT Stop Time [phone]   ? PT Time Calculation (min) 43 min   ? Activity Tolerance Patient tolerated treatment well   ? Behavior During Therapy [facility] for tasks assessed/performed   ? ?  ?  ? ?  ? ? ? ? ? ? ? ? ? ?Past Medical History:  ?Diagnosis Date  ? Anemia   ? Anxiety   ? GERD (gastroesophageal reflux disease)   ? Hypothyroidism   ? ?Past Surgical History:  ?Procedure Laterality Date  ? ESOPHAGEAL DILATION    ? ?Patient Active Problem List  ? Diagnosis Date Noted  ? Other intervertebral disc degeneration, lumbar region 02/09/2022  ? Fatigue 08/02/2021  ? Dysphagia 11/14/2020  ? Pelvic floor dysfunction 10/02/2020  ? Varicose veins of legs, antepartum 12/19/2019  ? Plantar wart, right foot 03/18/2018  ? GERD (gastroesophageal reflux disease) 07/25/2016  ? Tinea pedis of both feet 07/25/2016  ? Hypothyroidism 07/19/2015  ? 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 Chronic UTI 10/24/2013  ? IBS (irritable bowel syndrome) 06/29/2012  ? ? ?REFERRING DIAG: Acute bilateral low back pain with right-sided sciatica ? ?THERAPY DIAG:  ?Acute bilateral low back pain with right-sided sciatica ? ?Cramp and spasm ? ?Muscle weakness (generalized) ? ?Abnormal posture ? ?Unspecified lack of coordination ? ?SUBJECTIVE:                                                                                                                                                               Not hurting.  I have some questions about Pilates and  what I need to do to prevent reinjury.  ?  PAIN:  ?Are you having pain? Yes: NPRS scale: 2/10 ?Pain location: low back ?Pain description: ache , intermittent ?Aggravating factors: work ?Relieving factors: meds, ice packs  invertor table, therex ?  ?PERTINENT HISTORY:  ?Anxiety, Hx of thoracic back pain ? ?PRECAUTIONS: None ?  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  ?PATIENT GOALS: Decrease pain and be more active with family ?  ?  ?OBJECTIVE:  ?  ?DIAGNOSTIC FINDINGS:  ?IMPRESSION: ?1. Transitional S1 vertebra. ?2. 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 No neural impingement or  visible inflammation throughout ?the lumbar spine. ?  ?  ?Electronically Signed ?  By: Jorje Guild M.D. ?  On: 01/15/2022 06:27 ?  ?PATIENT SURVEYS:  ?Modified Oswestry 52% severe disability  ?  ?SCREENING FOR RED FLAGS: ?Bowel or bladder incontinence: No ?  ?COGNITION: ?          Overall cognitive status: Within functional limits for tasks assessed               ?           ?SENSATION: ?WFL ?  ?MUSCLE LENGTH: ?Hamstrings: Right WNLs  ?Thomas test: Right WNLs ?  ?POSTURE:  ?Anterior rotation of the pelvis with increased lumbar lordosis  ?  ?PALPATION: ?TTP of the lumbar paraspinals ?CPAs decreased pain ?  ?LUMBAR ROM:  ?  ?Active  A/PROM  ?01/22/2022 AROM  ?02/25/22  ?Flexion Mod limited c LB tightness and sharp pain with return Fingertips to floor  ?Extension Markedly limited c sharp LBP WNL  ?Right lateral flexion Min limited with L LB tightness WNL, pressure with added extension  ?Left lateral flexion Min limited with R LP tightness  WNL  ?Right rotation Min limited c general LB tightness WNL  ?Lt rotation  Min limited c general LB tightness WNL  ? (Blank rows = not tested) ?  ?  ?LE MMT: ?                      Myotomal screen was negative c LE strength WNLs ?MMT Right ?02/25/22 Left ?02/25/22  ?Hip flexion  4/5  4/5  ?Hip extension      ?Hip abduction      ?Hip adduction      ?Hip internal  rotation      ?Hip external rotation      ?Knee flexion  5/5  5/5  ?Knee extension  5/5  5/5  ?Ankle dorsiflexion      ?Ankle plantarflexion      ?Ankle inversion      ?Ankle eversion      ? (Blank rows = not tested) ?  ?LUMBAR SPECIAL TESTS:  ?Straight leg raise test: Negative and Slump test: Negative ?  ?GAIT: ?Distance walked: 100 ft within clinic ?Assistive device utilized: None ?Level of assistance: Complete Independence ?Comments: Decreased pace ?[facility] Adult PT Treatment:                                                DATE: 03/07/22 ?Therapeutic Exercise: ?Pilates Reformer used for LE/core strength, postural strength, lumbopelvic disassociation and core control.  Exercises included: ?Footwork 2 red 1 blue 1 yellow ?Heels, wide in turnout ?Bridging 3 springs with pilates circle  ?Added press out x 10  ?Supine Abs 90/90 unilateral UE horizontal abd 5 lbs x 10 each  ?Alternating knee ext ?Added overhead lift  ?Feet in Straps single leg 1 red arcs and single knee ext.  ? ?Self Care: ?Options regarding studio vs app vs gym ?Lengthy discussion on preventing reinjury.  ?  ?[place] Adult PT Treatment:                                                DATE: 03/03/22 ?Self care:  ? Options  for posture, leaning, using feet apart, kneeling (1/2) and switching sides.  ? ?Therapeutic Exercise: ?Mat level stretching: bridging x 10 , lower trunk rotation x 10 and dead bug x 10 each UE and LE  ?Pilates Reformer used for LE/core strength, postural strength, lumbopelvic disassociation and core control.  Exercises included: ?Feet in Straps ?Sidelying (S/L) 1 red hip flex/ext x 10 ?S/L hip flex/ext x 10  ?Circles  ?Quadruped  1 red LE and the added arms  ?Facing back elbows on short box, lower abs with hip flexion ?Long box Prone ?Overhead press 1 red x 10  ?Added hip extension and then shoulder flexion (swimming)  ?Luiz Blare /extension 1 red spring  ?Seated Arms/Abd ?1 Red hinge x 8, then 1 blue hinge ?1 blue horizontal abduction (arms  extended) ?1 red 1 yellow x 10 goal post arms ? ?[facility] Adult PT Treatment:                                                DATE: 02/27/22 ?Therapeutic Exercise: ?Recumbent bike 5 min L2 , cramping ant Rt hip, had to stretch and eventually hip popped and it felt better.   ?Sit to stand 10 lbs x 10  ?Added chest press x 10 lbs x 10  ?Squatting 10 lbs x 15, then 15 lbs x 15  ?Hip hinging dowel ?Dead lift partial 10 lbs x  2 sets ?Singel leg hip hinge with and without UE asst ?Step ups (reverse) 8 inch x 10 each side  ?Step ups (reverse) with 10 lbs wgt ?Glider lunge no wgt.  ?Glute med drops off step x 20 ?Hip abduction with 1 UE x 15 Rt LE  ? ? ?[place] Adult PT Treatment:                                                DATE: 02/25/22 ?Therapeutic Exercise: ? Elliptical 5 min L2 resist and L 10 ramp  ?Pilates Tower for LE/Core strength, postural strength, lumbopelvic disassociation and core control.  Exercises included: ? ?Supine Leg [place] 1 yellow arcs, parallel and turnout x 10  ?Leg circles x 8 each direction  ?Double leg and single leg  ?Squats (frog) x  10 , cues to avoid lumbar flexion  added knee extension at 90 deg hip flex  ? ?Sidelying Leg Springs purple springs  ?  Hip abduction/adduction x 10  ?  Sidekicks (flex/ext) x 10  ?  Small circles x 8 each direction ? ?Arm [place] Yellow Arcs x 10 with ball squeeze added bridge x 10  ?Arm arcs with SLR x 6 each side  ?Self Care: ?POC and Pilates, strength    training needed for functional lifting and mobility   ? ?  ?  ?PATIENT EDUCATION:  ?Education details: spine extension v flexion , neutral core work  ?Person educated: Patient ?Education method: Explanation, Demonstration, Tactile cues, Verbal cues, and Handouts ?Education comprehension: verbalized understanding, returned demonstration, verbal cues required, tactile cues required, and needs further education ?  ?  ?HOME EXERCISE PROGRAM: ? ?Access Code: [email or web address] ?URL: [email or web address] ?Date:  02/04/2022 ?Prepared by: Gar Ponto ? ?Exercises ?- Prone Static Back Extension on Pillows  - 4 x daily - 7 x weekly -  1 sets - 1 reps ?- Static Prone on Elbows  - 4 x daily - 7 x weekly - 1 sets - 1 reps ?- Prone Pres

## 2022-03-10 ENCOUNTER — Encounter: Payer: Medicaid Other | Admitting: Physical Therapy

## 2022-03-13 ENCOUNTER — Encounter: Payer: Medicaid Other | Admitting: Physical Therapy

## 2022-03-20 DIAGNOSIS — Z1329 Encounter for screening for other suspected endocrine disorder: Secondary | ICD-10-CM | POA: Diagnosis not present

## 2022-03-20 DIAGNOSIS — Z3481 Encounter for supervision of other normal pregnancy, first trimester: Secondary | ICD-10-CM | POA: Diagnosis not present

## 2022-03-20 DIAGNOSIS — O26891 Other specified pregnancy related conditions, first trimester: Secondary | ICD-10-CM | POA: Diagnosis not present

## 2022-03-20 DIAGNOSIS — Z369 Encounter for antenatal screening, unspecified: Secondary | ICD-10-CM | POA: Diagnosis not present

## 2022-03-20 DIAGNOSIS — Z3A09 9 weeks gestation of pregnancy: Secondary | ICD-10-CM | POA: Diagnosis not present

## 2022-03-20 LAB — OB RESULTS CONSOLE ABO/RH: RH Type: POSITIVE

## 2022-03-20 LAB — OB RESULTS CONSOLE HIV ANTIBODY (ROUTINE TESTING): HIV: NONREACTIVE

## 2022-03-20 LAB — HEPATITIS C ANTIBODY: HCV Ab: NEGATIVE

## 2022-03-20 LAB — OB RESULTS CONSOLE ANTIBODY SCREEN: Antibody Screen: NEGATIVE

## 2022-03-20 LAB — OB RESULTS CONSOLE RPR: RPR: NONREACTIVE

## 2022-03-20 LAB — OB RESULTS CONSOLE HEPATITIS B SURFACE ANTIGEN: Hepatitis B Surface Ag: NEGATIVE

## 2022-03-20 LAB — OB RESULTS CONSOLE RUBELLA ANTIBODY, IGM: Rubella: NON-IMMUNE/NOT IMMUNE

## 2022-03-20 LAB — TSH: TSH: 2.47 (ref 0.41–5.90)

## 2022-03-28 ENCOUNTER — Encounter: Payer: Self-pay | Admitting: Family Medicine

## 2022-03-28 DIAGNOSIS — E039 Hypothyroidism, unspecified: Secondary | ICD-10-CM

## 2022-03-28 DIAGNOSIS — Z23 Encounter for immunization: Secondary | ICD-10-CM

## 2022-04-09 ENCOUNTER — Encounter: Payer: Self-pay | Admitting: Obstetrics and Gynecology

## 2022-04-09 ENCOUNTER — Ambulatory Visit: Payer: Medicaid Other | Admitting: Obstetrics and Gynecology

## 2022-04-09 VITALS — BP 108/73 | HR 66 | Ht 67.0 in | Wt 146.0 lb

## 2022-04-09 DIAGNOSIS — R300 Dysuria: Secondary | ICD-10-CM

## 2022-04-09 DIAGNOSIS — M62838 Other muscle spasm: Secondary | ICD-10-CM | POA: Diagnosis not present

## 2022-04-09 LAB — POCT URINALYSIS DIP (CLINITEK)
Bilirubin, UA: NEGATIVE
Blood, UA: NEGATIVE
Glucose, UA: NEGATIVE mg/dL
Ketones, POC UA: NEGATIVE mg/dL
Leukocytes, UA: NEGATIVE
Nitrite, UA: NEGATIVE
POC PROTEIN,UA: NEGATIVE
Spec Grav, UA: 1.01 (ref 1.010–1.025)
Urobilinogen, UA: 0.2 E.U./dL
pH, UA: 6 (ref 5.0–8.0)

## 2022-04-09 MED ORDER — LIDOCAINE HCL 2 % EX GEL: 1.0000 "application " | CUTANEOUS | 5 refills | Status: DC | PRN

## 2022-04-09 NOTE — Progress Notes (Unsigned)
[facility] New Patient Evaluation and Consultation  Referring Provider: Martyn Malay, MD PCP: Martyn Malay, MD Date of Service: 04/09/2022  SUBJECTIVE Chief Complaint: pain and burning from several times a week to several times, pain on inside left side of vaginal wall that radiates down (GYN said could be nerve related. Had with period and real bad at end of last pregnancy ), and currently pregnant  History of Present Illness: Olivia Shepard is a 31 y.o. White or Caucasian female seen in consultation at the request of Dr. Owens Shark for evaluation of burning with urination.    Currently 12w 3d pregnant.   \Review of records significant for: Mycoplasma genitalium NAA Negative Negative   Mycoplasma hominis NAA Negative Negative   Ureaplasma spp NAA Negative Negative   History of urethral burning. Urine culture 11/29/21 positive for lactobacillis. On 12/12/21, negative urine culture.   Urinary Symptoms: Leaks urine with cough/ sneeze, laughing, exercise, lifting, and with urgency Leaks 1-4 time(s) per day.  Pad use: 3-4 pads/ liners per day.   She is bothered by her UI symptoms. Has done pelvic physical therapy after the last two pregnancies- [facility].   Day time voids 6.  Nocturia: 1 times per night to void. Voiding dysfunction: she empties her bladder well.  does not use a catheter to empty bladder.  When urinating, she feels she has no difficulties Drinks: 1 cup coffee per day, water, occasional soda per day.   UTIs: 1 UTI's in the last year.  Has a history of frequent UTIs around 2017 and was on daily antibiotics. After this most recent infection in Feb, had burning and urgency. Now has symptoms on and off. In the last month it happened twice and lasted 1-2 days and went away.  Denies history of blood in urine and kidney or bladder stones  Pelvic Organ Prolapse Symptoms:                  She Denies a feeling of a bulge the vaginal area.   Bowel Symptom: Bowel  movements: every day or every other Stool consistency: hard Straining: yes.  Splinting: yes.  Incomplete evacuation: no She Denies accidental bowel leakage / fecal incontinence Bowel regimen: diet, fiber, miralax, and water intake Last colonoscopy: Date 03/2008- normal  Sexual Function Sexually active: yes.  Sexual orientation: Straight Pain with sex: No  Pelvic Pain Admits to pelvic pain Location: to the left of the vagina down the back of the leg Pain occurs: periods, during pregnancy Prior pain treatment: none Improved by: massage Worsened by: cramps  Pain started at the end of her last pregnancy and continued with her periods after pregnancy.   Past Medical History:  Past Medical History:  Diagnosis Date   Anemia    Anxiety    GERD (gastroesophageal reflux disease)    Hypothyroidism      Past Surgical History:   Past Surgical History:  Procedure Laterality Date   ESOPHAGEAL DILATION       Past OB/GYN History: OB History  Gravida Para Term Preterm AB Living  '5 4 4     4  '$ SAB IAB Ectopic Multiple Live Births        0 3    # Outcome Date GA Lbr Len/2nd Weight Sex Delivery Anes PTL Lv  5 Current           4 Term 05/05/20          3 Term 04/06/18 30w1d16:32 / 00:10 9  lb 0.4 oz (4.094 kg) F Vag-Spont Local  LIV  2 Term 09/16/16 57w4d11:27 / 00:01 8 lb 10.5 oz (3.925 kg) M Vag-Spont None, Other  LIV     Birth Comments: wnl  1 Term 03/04/15     Vag-Spont   LIV   No Forceps/ Vacuum deliveries History of 2nd degree lac that extended to the urethra with her first delivery.  Last pap smear was 09/2021- abnormal squamous cells.    Medications: She has a current medication list which includes the following prescription(s): levothyroxine, lidocaine, and prenatal multivitamin.   Allergies: Patient has No Known Allergies.   Social History:  Social History   Tobacco Use   Smoking status: Never   Smokeless tobacco: Never  Vaping Use   Vaping Use: Never used   Substance Use Topics   Alcohol use: No    Alcohol/week: 0.0 standard drinks   Drug use: No    Relationship status: married She lives with husband and kids.   She is employed as a [employer] photographer Regular exercise: Yes: pilates, barre, core, yoga, swimming 4 days per week History of abuse: No  Family History:   Family History  Problem Relation Age of Onset   Diabetes Mother    Atrial fibrillation Father    Prostate cancer Father    Lung cancer Maternal Grandmother    Colon cancer Paternal Great-grandfather    Stomach cancer Paternal Great-grandfather    Colon cancer Paternal Great-grandmother    Esophageal cancer Neg Hx      Review of Systems: Review of Systems  Constitutional:  Negative for fever, malaise/fatigue and weight loss.  Respiratory:  Negative for cough, shortness of breath and wheezing.   Cardiovascular:  Negative for chest pain, palpitations and leg swelling.  Gastrointestinal:  Negative for abdominal pain and blood in stool.  Genitourinary:  Positive for dysuria.  Musculoskeletal:  Negative for myalgias.  Skin:  Negative for rash.  Neurological:  Negative for dizziness and headaches.  Endo/Heme/Allergies:  Does not bruise/bleed easily.  Psychiatric/Behavioral:  Negative for depression. The patient is nervous/anxious.     OBJECTIVE Physical Exam: Vitals:   04/09/22 1622  BP: 108/73  Pulse: 66  SpO2: 99%  Weight: 146 lb (66.2 kg)  Height: '5\' 7"'$  (1.702 m)    Physical Exam Constitutional:      General: She is not in acute distress. Pulmonary:     Effort: Pulmonary effort is normal.  Abdominal:     General: There is no distension.     Palpations: Abdomen is soft.     Tenderness: There is no abdominal tenderness. There is no rebound.  Musculoskeletal:        General: No swelling. Normal range of motion.  Skin:    General: Skin is warm and dry.     Findings: No rash.  Neurological:     Mental Status: She is alert and oriented to person, place, and  time.  Psychiatric:        Mood and Affect: Mood normal.        Behavior: Behavior normal.     GU / Detailed Urogynecologic Evaluation:  Pelvic Exam: Normal external female genitalia; Bartholin's and Skene's glands normal in appearance; urethral meatus normal in appearance, no urethral masses or discharge.   CST: negative  Q tip test: no tenderness on labia. Allodynia with palpation at introitus on the left.   Speculum exam reveals normal vaginal mucosa without atrophy. Cervix normal appearance. Uterus enlarged, retroverted. Adnexa no mass,  fullness, tenderness.     Pelvic floor strength II/V  Pelvic floor musculature: Right levator non-tender, Right obturator non-tender, Left levator tender- very taught, palpation reproduced sensation of tenderness on vulva and down left leg.  Left obturator tender  POP-Q:   POP-Q  -2                                            Aa   -2                                           Ba  -6                                              C   4                                            Gh  5                                            Pb  9                                            tvl   -2                                            Ap  -2                                            Bp  -8                                              D     Rectal Exam:  Normal external rectum  Post-Void Residual (PVR) by Bladder Scan: Not obtained due to pregnancy  Laboratory Results: POC urine: negative   ASSESSMENT AND PLAN Ms. Geraghty is a 31 y.o. with:  1. Dysuria   2. Levator spasm    Dysuria/ urethral pain - unclear source and could be related to interstitial cystitis - If IC, we discussed that there are some treatments available but most not recommended during pregnancy.  - She should avoid bladder irritants (coffee, soda), work on Child psychotherapist. Also recommended supplements to decrease inflammation (fish oil) - Prescribed lidocaine  jelly to help with irritation.   2. Levator spasm - The origin of pelvic floor muscle spasm can be multifactorial, including primary, reactive to a different pain source,  trauma, or even part of a centralized pain syndrome.Treatment options include pelvic floor physical therapy, local (vaginal) or oral  muscle relaxants, pelvic muscle trigger point injections or centrally acting pain medications.  She would not be a candidate for some of the medications or injections until after pregnancy. Some muscle relaxants are compatible.  - Recommend starting with pelvic floor physical therapy to reduce muscle tension.   Follow up as needed after PT   Jaquita Folds, MD   Medical Decision Making:  - Reviewed/ ordered a clinical laboratory test - Review and summation of prior records

## 2022-04-09 NOTE — Patient Instructions (Addendum)
Today we talked about ways to manage bladder urgency such as altering your diet to avoid irritative beverages and foods (bladder diet) as well as attempting to decrease stress and other exacerbating factors.    The Most Bothersome Foods* The Least Bothersome Foods*  Coffee - Regular & Decaf Tea - caffeinated Carbonated beverages - cola, non-colas, diet & caffeine-free Alcohols - Beer, Red Wine, White Wine, Champagne Fruits - Grapefruit, [place]reeze, Orange, [employer] - Cranberry, Grapefruit, Orange, Pineapple Vegetables - Tomato & Tomato Products Flavor Enhancers - Hot peppers, Spicy foods, Chili, Horseradish, Vinegar, Monosodium glutamate (MSG) Artificial Sweeteners - NutraSweet, Sweet 'N Low, Equal (sweetener), Saccharin Ethnic foods - [place], [place], [place] food Express Scripts - low-fat & whole Fruits - Bananas, Blueberries, Honeydew melon, Pears, Raisins, Watermelon Vegetables - Broccoli, Brussels Sprouts, [place], Carrots, Cauliflower, [place], Cucumber, Mushrooms, Peas, Radishes, Squash, Zucchini, White potatoes, Sweet potatoes & yams Poultry - Chicken, Eggs, [place], [employer] - Beef, Programmer, multimedia, Lamb Seafood - Shrimp, [place] fish, Salmon Grains - Oat, Rice Snacks - Pretzels, Popcorn  *Lissa Morales et al. Diet and its role in interstitial cystitis/bladder pain syndrome (IC/BPS) and comorbid conditions. [place] 2012 Jan 11.    Additional treatments to try for relief of IC symptoms:  Over the counter natural supplements: -Bladder Ease by Vitanica, Bladder Rest or Bladder Builder (all contain L-arginine) - helps to rebuild protective bladder layer and reduce bladder pain -Marshmallow Root (relieves inflammation in the urinary tract) - pills available or drink as a tea -Aloe Vera: Desert Harvest Aloe Vera capsules,  AloePath (also contains L-arginine and calcium cabonate)- can take several months to see improvement -Fish oil - 4 g daily -Tumeric (standardized to 95%  curcuminoids) - 500 mg three times daily  -Mindfulness practice (yoga, meditation, deep breathing)

## 2022-04-10 MED ORDER — LIDOCAINE-PRILOCAINE 2.5-2.5 % EX CREA: 1.0000 "application " | TOPICAL_CREAM | CUTANEOUS | 11 refills | Status: DC | PRN

## 2022-04-14 ENCOUNTER — Ambulatory Visit: Payer: Medicaid Other | Admitting: Nurse Practitioner

## 2022-04-14 ENCOUNTER — Encounter: Payer: Self-pay | Admitting: Nurse Practitioner

## 2022-04-14 VITALS — BP 106/73 | HR 77 | Ht 67.0 in | Wt 148.0 lb

## 2022-04-14 DIAGNOSIS — E039 Hypothyroidism, unspecified: Secondary | ICD-10-CM

## 2022-04-14 DIAGNOSIS — Z3A13 13 weeks gestation of pregnancy: Secondary | ICD-10-CM

## 2022-04-14 NOTE — Therapy (Deleted)
OUTPATIENT PHYSICAL THERAPY FEMALE PELVIC EVALUATION   Patient Name: Olivia Shepard MRN: [id number] DOB:1991/06/11, 31 y.o., female Today's Date: 04/14/2022    Past Medical History:  Diagnosis Date   Anemia    Anxiety    GERD (gastroesophageal reflux disease)    Hypothyroidism    Past Surgical History:  Procedure Laterality Date   ESOPHAGEAL DILATION     Patient Active Problem List   Diagnosis Date Noted   Other intervertebral disc degeneration, lumbar region 02/09/2022   Fatigue 08/02/2021   Dysphagia 11/14/2020   Pelvic floor dysfunction 10/02/2020   Varicose veins of legs, antepartum 12/19/2019   Plantar wart, right foot 03/18/2018   GERD (gastroesophageal reflux disease) 07/25/2016   Tinea pedis of both feet 07/25/2016   Hypothyroidism 07/19/2015   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   Chronic UTI 10/24/2013   IBS (irritable bowel syndrome) 06/29/2012    PCP: Martyn Malay, MD  REFERRING PROVIDER: Jaquita Folds, MD  REFERRING DIAG: [phone] (ICD-10-CM) - Levator spasm  THERAPY DIAG:  No diagnosis found.  Rationale for Evaluation and Treatment Rehabilitation  ONSET DATE: ***  SUBJECTIVE:                                                                                                                                                                                           SUBJECTIVE STATEMENT: *** Fluid intake: {Yes/No:304960894}   Patient confirms identification and approves PT to assess pelvic floor and treatment {yes/no:20286}   PAIN:  Are you having pain? {yes/no:20286} NPRS scale: ***/10 Pain location: {pelvic pain location:27098}  Pain type: {type:313116} Pain description: {PAIN DESCRIPTION:21022940}   Aggravating factors: *** Relieving factors: ***  PRECAUTIONS: {Therapy precautions:24002}  WEIGHT BEARING RESTRICTIONS {Yes ***/No:24003}  FALLS:  Has patient fallen in last 6 months? {fallsyesno:27318}  LIVING  ENVIRONMENT: Lives with: {OP[facility] lives with:25569::"lives with their family"} Lives in: {Lives in:25570} Stairs: {opstairs:27293} Has following equipment at home: {Assistive devices:23999}  OCCUPATION: ***  PLOF: {PLOF:24004}  PATIENT GOALS ***  PERTINENT HISTORY:  Hypothyroidism Sexual abuse: {Yes/No:304960894}  BOWEL MOVEMENT Pain with bowel movement: {yes/no:20286} Type of bowel movement:{PT BM type:27100} Fully empty rectum: {Yes/No:304960894} Leakage: {Yes/No:304960894} Pads: {Yes/No:304960894} Fiber supplement: {Yes/No:304960894}  URINATION Pain with urination: {yes/no:20286} Fully empty bladder: {Yes/No:304960894} Stream: {PT urination:27102} Urgency: {Yes/No:304960894} Frequency: *** Leakage: {PT leakage:27103} Pads: {Yes/No:304960894}  INTERCOURSE Pain with intercourse: {pain with intercourse PA:27099} Ability to have vaginal penetration:  {Yes/No:304960894} Climax: *** Marinoff Scale: ***/3  PREGNANCY Vaginal deliveries *** Tearing {Yes***/No:304960894} C-section deliveries *** Currently pregnant {Yes***/No:304960894}  PROLAPSE {PT prolapse:27101}    OBJECTIVE:   DIAGNOSTIC FINDINGS:  ***  PATIENT SURVEYS:  {rehab  surveys:24030}  PFIQ-7 ***  COGNITION:  Overall cognitive status: {cognition:24006}     SENSATION:  Light touch: {intact/deficits:24005}  Proprioception: {intact/deficits:24005}  MUSCLE LENGTH: Hamstrings: Right *** deg; Left *** deg Thomas Shepard: Right *** deg; Left *** deg  LUMBAR SPECIAL TESTS:  {lumbar special Shepard:25242}  FUNCTIONAL TESTS:  {Functional tests:24029}  GAIT: Distance walked: *** Assistive device utilized: {Assistive devices:23999} Level of assistance: {Levels of assistance:24026} Comments: ***  POSTURE:  ***  LUMBARAROM/PROM  A/PROM A/PROM  eval  Flexion   Extension   Right lateral flexion   Left lateral flexion   Right rotation   Left rotation    (Blank rows = not tested)  LOWER  EXTREMITY ROM:  {AROM/PROM:27142} ROM Right eval Left eval  Hip flexion    Hip extension    Hip abduction    Hip adduction    Hip internal rotation    Hip external rotation    Knee flexion    Knee extension    Ankle dorsiflexion    Ankle plantarflexion    Ankle inversion    Ankle eversion     (Blank rows = not tested)  LOWER EXTREMITY MMT:  MMT Right eval Left eval  Hip flexion    Hip extension    Hip abduction    Hip adduction    Hip internal rotation    Hip external rotation    Knee flexion    Knee extension    Ankle dorsiflexion    Ankle plantarflexion    Ankle inversion    Ankle eversion     PELVIC MMT:   MMT eval  Vaginal   Internal Anal Sphincter   External Anal Sphincter   Puborectalis   Diastasis Recti   (Blank rows = not tested)        PALPATION:   General  ***                External Perineal Exam ***                             Internal Pelvic Floor ***  TONE: ***  PROLAPSE: ***  TODAY'S TREATMENT  EVAL ***   PATIENT EDUCATION:  Education details: *** Person educated: {Person educated:25204} Education method: {Education Method:25205} Education comprehension: {Education Comprehension:25206}   HOME EXERCISE PROGRAM: ***  ASSESSMENT:  CLINICAL IMPRESSION: Patient is a *** y.o. *** who was seen today for physical therapy evaluation and treatment for ***.    OBJECTIVE IMPAIRMENTS {opptimpairments:25111}.   ACTIVITY LIMITATIONS {activitylimitations:27494}  PARTICIPATION LIMITATIONS: {participationrestrictions:25113}  PERSONAL FACTORS {Personal factors:25162} are also affecting patient's functional outcome.   REHAB POTENTIAL: {rehabpotential:25112}  CLINICAL DECISION MAKING: {clinical decision making:25114}  EVALUATION COMPLEXITY: {Evaluation complexity:25115}   GOALS: Goals reviewed with patient? {yes/no:20286}  SHORT TERM GOALS: Target date: {follow up:25551}  *** Baseline: Goal status:  {GOALSTATUS:25110}  2.  *** Baseline:  Goal status: {GOALSTATUS:25110}  3.  *** Baseline:  Goal status: {GOALSTATUS:25110}  4.  *** Baseline:  Goal status: {GOALSTATUS:25110}  5.  *** Baseline:  Goal status: {GOALSTATUS:25110}  6.  *** Baseline:  Goal status: {GOALSTATUS:25110}  LONG TERM GOALS: Target date: {follow up:25551}   *** Baseline:  Goal status: {GOALSTATUS:25110}  2.  *** Baseline:  Goal status: {GOALSTATUS:25110}  3.  *** Baseline:  Goal status: {GOALSTATUS:25110}  4.  *** Baseline:  Goal status: {GOALSTATUS:25110}  5.  *** Baseline:  Goal status: {GOALSTATUS:25110}  6.  *** Baseline:  Goal status: {GOALSTATUS:25110}  PLAN: PT FREQUENCY: {rehab frequency:25116}  PT DURATION: {rehab duration:25117}  PLANNED INTERVENTIONS: {rehab planned interventions:25118::"Therapeutic exercises","Therapeutic activity","Neuromuscular re-education","Balance training","Gait training","Patient/Family education","Joint mobilization"}  PLAN FOR NEXT SESSION: ***   Onyinyechi Huante, PT 04/14/2022, 8:24 AM

## 2022-04-14 NOTE — Progress Notes (Signed)
Endocrinology Consult Note                                         04/14/2022, 3:33 PM  Subjective:   Subjective    Olivia Shepard is a 31 y.o.-year-old female patient being seen in consultation for hypothyroidism referred by Martyn Malay, MD.   Past Medical History:  Diagnosis Date   Anemia    Anxiety    GERD (gastroesophageal reflux disease)    Hypothyroidism     Past Surgical History:  Procedure Laterality Date   ESOPHAGEAL DILATION      Social History   Socioeconomic History   Marital status: Married    Spouse name: Not on file   Number of children: Not on file   Years of education: Not on file   Highest education level: Not on file  Occupational History   Not on file  Tobacco Use   Smoking status: Never   Smokeless tobacco: Never  Vaping Use   Vaping Use: Never used  Substance and Sexual Activity   Alcohol use: No    Alcohol/week: 0.0 standard drinks   Drug use: No   Sexual activity: Yes    Birth control/protection: None  Other Topics Concern   Not on file  Social History Narrative   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 [employer] 4 children, 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   Social Determinants of Health   Financial Resource Strain: Not on file  Food Insecurity: Not on file  Transportation Needs: Not on file  Physical Activity: Not on file  Stress: Not on file  Social Connections: Not on file    Family History  Problem Relation Age of Onset   Diabetes Mother    Atrial fibrillation Father    Prostate cancer Father    Lung cancer Maternal Grandmother    Colon cancer Paternal Great-grandfather    Stomach cancer Paternal Great-grandfather    Colon cancer Paternal Great-grandmother    Esophageal cancer Neg Hx     Outpatient Encounter Medications as of 04/14/2022  Medication Sig   levothyroxine (SYNTHROID) 25 MCG tablet Take 1 tablet (25 mcg total) by mouth daily before breakfast.    lidocaine-prilocaine (EMLA) cream Apply 1 application. topically as needed (for urethral irritation).   Prenatal Vit-Fe Fumarate-FA (PRENATAL MULTIVITAMIN) TABS tablet Take 1 tablet by mouth daily.   No facility-administered encounter medications on file as of 04/14/2022.    ALLERGIES: No Known Allergies VACCINATION STATUS: Immunization History  Administered Date(s) Administered   DTaP 11/10/1989   Hepatitis B 08/06/2000, 01/28/2001, 10/27/2005   Hepatitis B, ped/adol 08/06/2000, 08/06/2000, 01/28/2001, 01/28/2001, 10/27/2005, 10/27/2005   Influenza Split 07/28/2012   Influenza, Seasonal, Injecte, Preservative Fre 07/28/2012, 08/03/2013, 08/11/2015, 08/18/2017, 08/27/2018   Influenza,inj,Quad PF,6+ Mos 08/27/2018, 06/27/2019, 08/02/2021   Influenza,inj,Quad PF,6-35 Mos 06/27/2019   Influenza,inj,quad, With Preservative 07/28/2012, 08/03/2013, 08/11/2015, 08/18/2017, 08/27/2018   Influenza-Unspecified 07/28/2012, 08/03/2013, 08/11/2015, 08/18/2017, 06/27/2019   MMR 02/17/1991, 02/09/1995, 09/18/2016  Meningococcal Conjugate 06/27/2008   Meningococcal Polysaccharide 06/27/2008   PFIZER Comirnaty(Gray Top)Covid-19 Tri-Sucrose Vaccine 12/25/2020   PFIZER(Purple Top)SARS-COV-2 Vaccination 07/11/2020   PPD Test 06/14/2008, 02/03/2011, 02/13/2012   Td 04/25/2005   Tdap 02/09/1995, 08/10/2014, 08/29/2016, 04/30/2020     HPI   Olivia Shepard  is a patient with the above medical history. she was diagnosed with hypothyroidism a few years ago as she and her husband were trying to conceive a child.  She was started on Levothyroxine 25 mcg po daily as a result.  She has been tested for thyroid antibodies several times, negative.  She is currently pregnant (around 13 weeks) and is concerned about her TSH levels as she has had several miscarriages previously (once in 2020, one in October 2022, and Jan 2023).  She asked for referral to endocrinology to help manage this to promote a healthy developing  fetus.  I reviewed patient's thyroid tests:  Lab Results  Component Value Date   TSH 2.47 03/20/2022   TSH 2.29 03/03/2022   TSH 2.480 08/02/2021   TSH 1.810 07/10/2020   TSH 2.360 08/27/2018   TSH 2.920 08/31/2015   TSH 1.850 07/19/2015   FREET4 0.98 08/31/2015     Pt describes: - weight gain - fatigue - cold intolerance  Pt denies feeling nodules in neck, hoarseness, dysphagia/odynophagia, SOB with lying down.  she does have family history of thyroid disorders in her mom (recently diagnosed).  No family history of thyroid cancer. No history of radiation therapy to head or neck.  No recent use of iodine supplements.  Denies use of Biotin containing supplements.  I reviewed her chart and she also has a history of GERD (had to have esophagus stretched), anemia, anxiety.   ROS:  Constitutional: + weight gain (reports faster weight gain with this pregnancy than her previous pregnancies), + fatigue, no subjective hyperthermia, + subjective hypothermia Eyes: no blurry vision, no xerophthalmia ENT: no sore throat, no nodules palpated in throat, no dysphagia/odynophagia, no hoarseness Cardiovascular: no chest pain, no SOB, no palpitations, no leg swelling Respiratory: no cough, no SOB Gastrointestinal: no nausea/vomiting/diarrhea Musculoskeletal: no muscle/joint aches Skin: no rashes Neurological: no tremors, no numbness, no tingling, no dizziness Psychiatric: no depression, no anxiety   Objective:   Objective     BP 106/73   Pulse 77   Ht '5\' 7"'  (1.702 m)   Wt 148 lb (67.1 kg)   LMP 11/21/2021   BMI 23.18 kg/m  Wt Readings from Last 3 Encounters:  04/14/22 148 lb (67.1 kg)  04/09/22 146 lb (66.2 kg)  02/07/22 140 lb (63.5 kg)    BP Readings from Last 3 Encounters:  04/14/22 106/73  04/09/22 108/73  02/07/22 98/66     Constitutional:  Body mass index is 23.18 kg/m., not in acute distress, normal state of mind Eyes: PERRLA, EOMI, no exophthalmos ENT: moist  mucous membranes, mild thyromegaly, no palpable nodularity, no cervical lymphadenopathy Cardiovascular: normal precordial activity, RRR, no murmur/rubs/gallops Respiratory:  adequate breathing efforts, no gross chest deformity, Clear to auscultation bilaterally Gastrointestinal: abdomen soft, non-tender, no distension, bowel sounds present Musculoskeletal: no gross deformities, strength intact in all four extremities Skin: moist, warm, no rashes Neurological: no tremor with outstretched hands, deep tendon reflexes normal in BLE.   CMP ( most recent) CMP     Component Value Date/Time   NA 143 08/02/2021 0937   K 5.1 08/02/2021 0937   CL 103 08/02/2021 0937   CO2 26 08/02/2021 0937   GLUCOSE 68  08/02/2021 0937   GLUCOSE 71 12/27/2015 1501   BUN 18 08/02/2021 0937   CREATININE 0.67 08/02/2021 0937   CREATININE 0.55 12/27/2015 1501   CALCIUM 10.0 08/02/2021 0937   PROT 7.1 08/02/2021 0937   ALBUMIN 4.8 08/02/2021 0937   AST 11 08/02/2021 0937   ALT 12 08/02/2021 0937   ALKPHOS 62 08/02/2021 0937   BILITOT 0.3 08/02/2021 0937   GFRNONAA 109 08/27/2018 1021   GFRNONAA >89 12/27/2015 1501   GFRAA 125 08/27/2018 1021   GFRAA >89 12/27/2015 1501     Diabetic Labs (most recent): No results found for: HGBA1C   Lipid Panel ( most recent) Lipid Panel     Component Value Date/Time   CHOL 144 12/27/2015 1501   TRIG 32 12/27/2015 1501   HDL 83 12/27/2015 1501   CHOLHDL 1.7 12/27/2015 1501   VLDL 6 12/27/2015 1501   LDLCALC 55 12/27/2015 1501       Lab Results  Component Value Date   TSH 2.47 03/20/2022   TSH 2.29 03/03/2022   TSH 2.480 08/02/2021   TSH 1.810 07/10/2020   TSH 2.360 08/27/2018   TSH 2.920 08/31/2015   TSH 1.850 07/19/2015   FREET4 0.98 08/31/2015      Assessment & Plan:   ASSESSMENT / PLAN:  1. Hypothyroidism-unspecified   Patient with long-standing hypothyroidism, on levothyroxine therapy. On physical exam, patient  does not have gross goiter,  thyroid nodules, or neck compression symptoms.  She is advised to continue Levothyroxine 25 mcg po daily before breakfast for now.  Typically, TSH should be lower (0.1-4) in the first trimester to ensure healthy development.    - We discussed about correct intake of levothyroxine, at fasting, with water, separated by at least 30 minutes from breakfast, and separated by more than 4 hours from calcium, iron, multivitamins, acid reflux medications (PPIs). -Patient is made aware of the fact that thyroid hormone replacement is needed for life, dose to be adjusted by periodic monitoring of thyroid function tests.  - Will check thyroid tests before next visit: TSH, free T4, FT3 and thyroid antibody testing to help classify her dysfunction.  -Due to absence of clinical goiter, no need for thyroid ultrasound at this time.    - Time spent with the patient: 45 minutes, of which >50% was spent in obtaining information about her symptoms, reviewing her previous labs, evaluations, and treatments, counseling her about her hypothyroidism, and developing a plan to confirm the diagnosis and long term treatment as necessary. Please refer to "Patient Self Inventory" in the Media tab for reviewed elements of pertinent patient history.  Olivia Shepard participated in the discussions, expressed understanding, and voiced agreement with the above plans.  All questions were answered to her satisfaction. she is encouraged to contact clinic should she have any questions or concerns prior to her return visit.   FOLLOW UP PLAN:  Return in about 2 weeks (around 04/28/2022) for Thyroid follow up, Virtual visit ok.  Rayetta Pigg, [facility] [address] [place], [place] [address] Phone: [phone] Fax: [phone]  04/14/2022, 3:33 PM

## 2022-04-14 NOTE — Patient Instructions (Signed)

## 2022-04-15 ENCOUNTER — Encounter: Payer: Medicaid Other | Admitting: Physical Therapy

## 2022-04-15 ENCOUNTER — Encounter: Payer: Self-pay | Admitting: *Deleted

## 2022-04-16 NOTE — Therapy (Signed)
OUTPATIENT PHYSICAL THERAPY FEMALE PELVIC EVALUATION   Patient Name: Olivia Shepard MRN: [id number] DOB:04/11/1991, 31 y.o., female Today's Date: 04/17/2022   PT End of Session - 04/17/22 0837     Visit Number 1    Date for PT Re-Evaluation 07/10/22    Authorization Type [place] MEDICAID [employer]Y BLUE., has used 10 this year    Authorization - Visit Number 1    Authorization - Number of Visits 17    PT Start Time 0830    PT Stop Time 0920    PT Time Calculation (min) 50 min    Activity Tolerance Patient tolerated treatment well    Behavior During Therapy WFL for tasks assessed/performed             Past Medical History:  Diagnosis Date   Anemia    Anxiety    GERD (gastroesophageal reflux disease)    Hypothyroidism    Past Surgical History:  Procedure Laterality Date   ESOPHAGEAL DILATION     Patient Active Problem List   Diagnosis Date Noted   Other intervertebral disc degeneration, lumbar region 02/09/2022   Fatigue 08/02/2021   Dysphagia 11/14/2020   Pelvic floor dysfunction 10/02/2020   Varicose veins of legs, antepartum 12/19/2019   Plantar wart, right foot 03/18/2018   GERD (gastroesophageal reflux disease) 07/25/2016   Tinea pedis of both feet 07/25/2016   Hypothyroidism 07/19/2015   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   Chronic UTI 10/24/2013   IBS (irritable bowel syndrome) 06/29/2012    PCP: Dorris Singh, MD  REFERRING PROVIDER: Jaquita Folds, MD  REFERRING DIAG: [phone] (ICD-10-CM) - Levator spasm  THERAPY DIAG:  Muscle weakness (generalized)  Pelvic pain  Rationale for Evaluation and Treatment Rehabilitation  ONSET DATE: 07/11/2021  SUBJECTIVE:                                                                                                                                                                                           SUBJECTIVE STATEMENT: Pain and burning  on the inside of left side of vaginal wall that radiates down.  Last 3 months of her last pregnancy she has pain. Patient burning pain started 10/10/2021 when she had a UTI an dthe burning pain has continued. Has had PT after last 2 pregnancies at [facility].    Patient confirms identification and approves PT to assess pelvic floor and treatment Yes   PAIN:  Are you having pain? Yes NPRS scale: 3/10, high is 8/10 Pain location: Vaginal and left side of the vaginal wall internally and radiates to the back of left leg to the left posterior knee.  Pain type: burning and pressure and shooting down leg Pain description: constant and with varying levels    Aggravating factors: worse at the end of the day, doing a lot of activity, having a period Relieving factors: pressure behind left knee and along the left ischial tuberosity.   PRECAUTIONS: Other: presently pregnant and due 10/19/2022  WEIGHT BEARING RESTRICTIONS No  FALLS:  Has patient fallen in last 6 months? No  LIVING ENVIRONMENT: Lives with: lives with their family  OCCUPATION: PRN as a NICU nurse  PLOF: Independent  PATIENT GOALS make pain go away  PERTINENT HISTORY:  Hypothyroidism,   BOWEL MOVEMENT Pain with bowel movement: Yes, when she strains and uses the squatty potty Type of bowel movement:Type (Bristol Stool Scale) Type 2 or 3, Frequency 1-2 days, and Strain Yes Fully empty rectum: Yes:   Fiber supplement: Yes: fiber daily, Miralax 1 time every 3-4 days  URINATION Pain with urination: Yes, few days per month Fully empty bladder: Yes:   Stream: Strong Urgency: Yes:   Frequency: 6-7 times per day Leakage: Urge to void, Coughing, Sneezing, Laughing, Exercise, Lifting, and   Pads: Yes: 3-4 panty liners per day  when she has a cold and coughing and sneezing  INTERCOURSE Pain with intercourse:  none   PREGNANCY Vaginal deliveries 5 Tearing Yes: grade 2, first baby tear went to the urethra C-section deliveries none Currently pregnant Yes: due on 12/  08/2022  PROLAPSE Rectocele      OBJECTIVE:   DIAGNOSTIC FINDINGS:  From the doctors note: Pelvic floor strength 2/5, Left levator tender and taught and reproduced sensation of tenderness on the vulva and down left leg, left OI tender  PATIENT SURVEYS:  UIQ-7 19; POPIQ-7 19  COGNITION:  Overall cognitive status: Within functional limits for tasks assessed     SENSATION:  Light touch: Deficits touch the back of left leg, medial left buttocks low, crease of left  buttocks and is tingly , and Q-tip test to the perineus has decreased sensation to the medial aspect of the left lebia for the lower 1/3, and left side between left labias.   Proprioception: Appears intact  MUSCLE LENGTH: Hamstrings: Right 55 deg; Left 65 deg  LUMBAR SPECIAL TESTS:  Straight leg raise test: Negative on the left    POSTURE:  Patient stands with a reduced lumbar lordosis.   LUMBARAROM/PROM  A/PROM A/PROM  eval  Flexion full  Extension Decreased by 25%  Right lateral flexion Decreased movement of L1-L5  Left lateral flexion Decreased movement of L1-L5  Right rotation Decreased by 25%  Left rotation Decreased by 25%   (Blank rows = not tested)  LOWER EXTREMITY ROM:  Passive ROM Right eval Left eval  Hip external rotation 68 54   (Blank rows = not tested)  LOWER EXTREMITY MMT:  MMT Right eval Left eval  Hip flexion 4/5 5/5  Hip extension 5/5 4/5  Hip abduction 3/5 3+/5  Hip adduction 5/5 4/5  Hip external rotation 4/5 5/5   PELVIC MMT:   MMT eval  Vaginal 2/5 ant/post, 1/5 laterally  (Blank rows = not tested)        PALPATION:   General  left ilium rotated posteriorly, sacrum rotated left                External Perineal Exam tenderness located on the medial side of the left ischiotuberosity  Internal Pelvic Floor tightness on the left side of the pelvic floor muscles. Touching on the ischial spine reproduced her symptoms. Tenderness located on  the left pelvic floor muscles.   TONE: Increased on left.   PROLAPSE: Not assessed.   TODAY'S TREATMENT  EVAL see below in HEP Check all possible CPT codes: 97110- Therapeutic Exercise     If treatment provided at initial evaluation, no treatment charged due to lack of authorization.        PATIENT EDUCATION:  Education details: Access Code: [email or web address] Person educated: Patient Education method: Explanation, Demonstration, Tactile cues, Verbal cues, and Handouts Education comprehension: verbalized understanding, returned demonstration, verbal cues required, tactile cues required, and needs further education   HOME EXERCISE PROGRAM: 04/17/2022 Access Code: [email or web address] URL: [email or web address] Date: 04/17/2022 Prepared by: Earlie Counts  Exercises - Supine Peroneal Nerve Glide  - 2 x daily - 7 x weekly - 1 sets - 5 reps - Quadruped Yoga Block Lift Off  - 1 x daily - 7 x weekly - 10 reps  ASSESSMENT:  CLINICAL IMPRESSION: Patient is a 31 y.o. female  who was seen today for physical therapy evaluation and treatment for levator spasm. Patient reports her pain is 3/10 form in side the left side of the bladder down to the left posterior thigh to her knee.  Patient pain is a burning, pressure and shooting pain. Patient feels a tingling when the therapist touches the left gluteal crease, left poster thigh, left levator ani, left side of the introitus and along the posterior labial branch. She has tightness in the left levator ani and obturator internist. Symptoms are reproduced when the therapist touches the left ischial spine. Patient leaks urine with urge to void, coughing, Sneezing, laughing, exercise, lifting. Pelvic floor strength is 2/5 anterior and posterior and 1/5 laterally. Left ilium is rotated posteriorly and sacrum rotated left. Decreased lumbar ROM by 25%. She has tight hamstring bilaterally with left worse than right. Patient has weakness in her hips. Patient is  currently pregnant and due 10/19/2022. She would be a good candidate for out Have Less Pain, Gain Info, and be Healthy During Your Pregnancy class. Patient will benefit from skilled therapy to reduce pain, improve leakage and muscle imbalances.   OBJECTIVE IMPAIRMENTS decreased activity tolerance, decreased coordination, decreased strength, increased fascial restrictions, increased muscle spasms, impaired tone, and pain.   ACTIVITY LIMITATIONS carrying, lifting, bending, sitting, standing, and squatting  PARTICIPATION LIMITATIONS: meal prep, cleaning, driving, shopping, community activity, and occupation  PERSONAL FACTORS Profession and 1 comorbidity: presently pregnant  are also affecting patient's functional outcome.   REHAB POTENTIAL: Excellent  CLINICAL DECISION MAKING: Evolving/moderate complexity  EVALUATION COMPLEXITY: Moderate   GOALS: Goals reviewed with patient? Yes  SHORT TERM GOALS: Target date: 05/15/2022  Patient independent with her stretches to mobilize the nerve through the tissue.  Baseline:not educated yet Goal status: INITIAL  2.  Patient able to sit with pain decreased </= 4/10 Baseline: pain level 3-8/10 Goal status: INITIAL   LONG TERM GOALS: Target date: 07/10/2022   Patient independent with advanced HEP for hip and SI joint strength to assist in reduction in pain Baseline: Not educated yet Goal status: INITIAL  2.  Patient reports her urinary leakage is minimal to none with urge to void, coughing, sneezing, laughing, exercise, lifting due to full circular contraction of the pelvic floor and strength >/= 3/5.  Baseline: strength is 2/5 anterior and posterior, 1/5 laterally and not a circular contraction.  Goal status: INITIAL  3.  Patient has the same sensation of the left gluteal fold down to the posterior left knee and along the left perineum due to the release of the nerves being releaseed.  Baseline: Patient sensation is a tingling sensation which is  not the same as the right.  Goal status: INITIAL  4.  Patient pelvis is correct alignment due to her exercising to stabilize the pelvis during daily activity. .  Baseline: left ilium is posteriorly rotated and sacrum rotated left.  Goal status: INITIAL  5.  Patients pain at the end of the day is </= 2/10 due to reduction of nerve symptoms, pelvis in correct alignment, and increased stability.  Baseline: end of day pain could be 8/10.  Goal status: INITIAL   PLAN: PT FREQUENCY: 1x/week  PT DURATION: 12 weeks  PLANNED INTERVENTIONS: Therapeutic exercises, Therapeutic activity, Neuromuscular re-education, Patient/Family education, Joint mobilization, Spinal mobilization, Cryotherapy, Moist heat, Taping, Biofeedback, and Manual therapy By signing I understand that I am ordering/authorizing the patient to be a participant of the Have Less Pain, Gain Info, and Be Healthy During Your Pregnancy class as a component of this plan of care.   PLAN FOR NEXT SESSION: correct pelvis, manual mobilization to lumbar, mobilization to the aycocks canal, manual release along the labia, hamstring stretch   Earlie Counts, PT 04/17/22 3:08 PM

## 2022-04-17 ENCOUNTER — Encounter: Payer: Self-pay | Admitting: Physical Therapy

## 2022-04-17 ENCOUNTER — Encounter: Payer: Medicaid Other | Attending: Obstetrics and Gynecology | Admitting: Physical Therapy

## 2022-04-17 DIAGNOSIS — M6281 Muscle weakness (generalized): Secondary | ICD-10-CM | POA: Diagnosis not present

## 2022-04-17 DIAGNOSIS — R102 Pelvic and perineal pain: Secondary | ICD-10-CM | POA: Insufficient documentation

## 2022-04-17 LAB — THYROGLOBULIN ANTIBODY: Thyroglobulin Antibody: <1 IU/mL (ref 0.0–0.9)

## 2022-04-17 LAB — T3, FREE: T3, Free: 2.5 pg/mL (ref 2.0–4.4)

## 2022-04-17 LAB — THYROID PEROXIDASE ANTIBODY: Thyroperoxidase Ab SerPl-aCnc: <9 IU/mL (ref 0–34)

## 2022-04-17 LAB — TSH: TSH: 2.2 u[IU]/mL (ref 0.450–4.500)

## 2022-04-17 LAB — T4, FREE: Free T4: 1.18 ng/dL (ref 0.82–1.77)

## 2022-04-17 MED ORDER — LEVOTHYROXINE SODIUM 50 MCG PO TABS
50.0000 ug | ORAL_TABLET | Freq: Every day | ORAL | 1 refills | Status: DC
Start: 2022-04-17 — End: 2022-06-09

## 2022-04-17 NOTE — Addendum Note (Signed)
Addended by: Brita Romp on: 04/17/2022 08:24 AM   Modules accepted: Orders

## 2022-04-17 NOTE — Progress Notes (Signed)
Spoke with patient this morning and I increased her Levothyroxine to 50 mcg po daily before breakfast.  Will need to repeat TFTs in 4 weeks to monitor.

## 2022-04-22 ENCOUNTER — Encounter: Payer: Medicaid Other | Admitting: Physical Therapy

## 2022-04-29 ENCOUNTER — Encounter: Payer: Medicaid Other | Admitting: Physical Therapy

## 2022-04-29 ENCOUNTER — Encounter: Payer: Self-pay | Admitting: Physical Therapy

## 2022-04-29 DIAGNOSIS — M6281 Muscle weakness (generalized): Secondary | ICD-10-CM | POA: Diagnosis not present

## 2022-04-29 DIAGNOSIS — R102 Pelvic and perineal pain: Secondary | ICD-10-CM | POA: Diagnosis not present

## 2022-04-29 NOTE — Therapy (Signed)
OUTPATIENT PHYSICAL THERAPY TREATMENT NOTE   Patient Name: Olivia Shepard MRN: [id number] DOB:1990/11/17, 31 y.o., female Today's Date: 04/29/2022  PCP: Dorris Singh, MD REFERRING PROVIDER: Jaquita Folds, MD  END OF SESSION:   PT End of Session - 04/29/22 0925     Visit Number 2    Number of Visits 13    Date for PT Re-Evaluation 07/10/22    Authorization Type [place] MEDICAID HEALTHY BLUE., has used 10 this year    Authorization Time Period 6/8-7/7    Authorization - Visit Number 2    Authorization - Number of Visits 4    PT Start Time 0930    PT Stop Time 1015    PT Time Calculation (min) 45 min    Activity Tolerance Patient tolerated treatment well    Behavior During Therapy WFL for tasks assessed/performed             Past Medical History:  Diagnosis Date   Anemia    Anxiety    GERD (gastroesophageal reflux disease)    Hypothyroidism    Past Surgical History:  Procedure Laterality Date   ESOPHAGEAL DILATION     Patient Active Problem List   Diagnosis Date Noted   Other intervertebral disc degeneration, lumbar region 02/09/2022   Fatigue 08/02/2021   Dysphagia 11/14/2020   Pelvic floor dysfunction 10/02/2020   Varicose veins of legs, antepartum 12/19/2019   Plantar wart, right foot 03/18/2018   GERD (gastroesophageal reflux disease) 07/25/2016   Tinea pedis of both feet 07/25/2016   Hypothyroidism 07/19/2015   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   Chronic UTI 10/24/2013   IBS (irritable bowel syndrome) 06/29/2012    REFERRING DIAG: T62.563 (ICD-10-CM) - Levator spasm  THERAPY DIAG:  Muscle weakness (generalized)  Pelvic pain  Rationale for Evaluation and Treatment Rehabilitation  PERTINENT HISTORY: Hypothyroidism,  PRECAUTIONS: Other: presently pregnant and due 10/19/2022   SUBJECTIVE: My pain is getting worse at night. I am wearing compression hoes.  PAIN:  Are you having pain? Yes NPRS scale: 1/10, high is 8/10 Pain location:  Vaginal and left side of the vaginal wall internally and radiates to the back of left leg to the left posterior knee.    Pain type: burning and pressure and shooting down leg Pain description: constant and with varying levels     Aggravating factors: worse at the end of the day, doing a lot of activity, having a period Relieving factors: pressure behind left knee and along the left ischial tuberosity.   PATIENT GOALS make pain go away  BOWEL MOVEMENT Pain with bowel movement: Yes, when she strains and uses the squatty potty Type of bowel movement:Type (Bristol Stool Scale) Type 2 or 3, Frequency 1-2 days, and Strain Yes Fully empty rectum: Yes:   Fiber supplement: Yes: fiber daily, Miralax 1 time every 3-4 days   URINATION Pain with urination: Yes, few days per month Fully empty bladder: Yes:   Stream: Strong Urgency: Yes:   Frequency: 6-7 times per day Leakage: Urge to void, Coughing, Sneezing, Laughing, Exercise, Lifting, and   Pads: Yes: 3-4 panty liners per day  when she has a cold and coughing and sneezing  OBJECTIVE: (objective measures completed at initial evaluation unless otherwise dated)  DIAGNOSTIC FINDINGS:  From the doctors note: Pelvic floor strength 2/5, Left levator tender and taught and reproduced sensation of tenderness on the vulva and down left leg, left OI tender   PATIENT SURVEYS:  UIQ-7 19; POPIQ-7 19  COGNITION:            Overall cognitive status: Within functional limits for tasks assessed                          SENSATION:            Light touch: Deficits touch the back of left leg, medial left buttocks low, crease of left  buttocks and is tingly , and Q-tip test to the perineus has decreased sensation to the medial aspect of the left lebia for the lower 1/3, and left side between left labias.             Proprioception: Appears intact   MUSCLE LENGTH: Hamstrings: Right 55 deg; Left 65 deg   LUMBAR SPECIAL TESTS:  Straight leg raise test:  Negative on the left       POSTURE:  Patient stands with a reduced lumbar lordosis.    LUMBARAROM/PROM   A/PROM A/PROM  eval  Flexion full  Extension Decreased by 25%  Right lateral flexion Decreased movement of L1-L5  Left lateral flexion Decreased movement of L1-L5  Right rotation Decreased by 25%  Left rotation Decreased by 25%   (Blank rows = not tested)   LOWER EXTREMITY ROM:   Passive ROM Right eval Left eval  Hip external rotation 68 54   (Blank rows = not tested)   LOWER EXTREMITY MMT:   MMT Right eval Left eval  Hip flexion 4/5 5/5  Hip extension 5/5 4/5  Hip abduction 3/5 3+/5  Hip adduction 5/5 4/5  Hip external rotation 4/5 5/5    PELVIC MMT:   MMT eval  Vaginal 2/5 ant/post, 1/5 laterally  (Blank rows = not tested)         PALPATION:   General  left ilium rotated posteriorly, sacrum rotated left                 External Perineal Exam tenderness located on the medial side of the left ischiotuberosity                             Internal Pelvic Floor tightness on the left side of the pelvic floor muscles. Touching on the ischial spine reproduced her symptoms. Tenderness located on the left pelvic floor muscles.    TONE: Increased on left.    PROLAPSE: Not assessed.    TODAY'S TREATMENT  04/29/2022 Manual: Soft tissue mobilization: to bilateral quadratus, manual work to left levator ani and obturator internists in right sidely then educated patient on how to do on herself Spinal mobilization: PA and rotational mobilization to T5-L5 in prone with pillows to make a valley for her abdomen; PA mobilization to right SI joint Exercises: Stretches/mobility:  Quadruped with cat cow to work on lumbar extension    Quadruped hinging hips with bilateral hip internal rotation Strengthening: Quadruped with transverse abdominus contraction    Quadruped with left knee on yoga block to open up the left SI joint then work on gluteus medius strength    Sitting  with yoga block between knees and shift hips forward and backward    EVAL see below in HEP Check all possible CPT codes: 97110- Therapeutic Exercise  If treatment provided at initial evaluation, no treatment charged due to lack of authorization.                                  PATIENT EDUCATION:  04/28/2022 Education details: Access Code: [email or web address] Person educated: Patient Education method: Explanation, Demonstration, Tactile cues, Verbal cues, and Handouts Education comprehension: verbalized understanding, returned demonstration, verbal cues required, tactile cues required, and needs further education     HOME EXERCISE PROGRAM: 04/28/2022 Access Code: [email or web address] URL: [email or web address] Date: 04/29/2022 Prepared by: Earlie Counts  Program Notes lay on right side and massage the left gluteal area. sit with yoga block between knees and shift hip forward and backward.   Exercises - Supine Peroneal Nerve Glide  - 2 x daily - 7 x weekly - 1 sets - 5 reps - Quadruped Yoga Block Lift Off  - 1 x daily - 7 x weekly - 10 reps - Hip Hinge Rock Back  - 1 x daily - 7 x weekly - 1 sets - 10 reps - Quadruped Transversus Abdominis Bracing  - 1 x daily - 7 x weekly - 1 sets - 10 reps   ASSESSMENT:   CLINICAL IMPRESSION: Patient is a 31 y.o. female  who was seen today for physical therapy  treatment for levator spasm. Patient had no pain after manual work. Her pelvis was in correct alignment after manual work. She is working on strengthening the muscles of  the SI joint. MD signed her initial note so she will be signing up for the Healthy Pregnancy class. She had some trigger points in the quadratus. Patient will benefit from skilled therapy to reduce pain, improve leakage and muscle imbalances.    OBJECTIVE IMPAIRMENTS decreased activity tolerance, decreased coordination, decreased strength, increased fascial restrictions, increased muscle spasms,  impaired tone, and pain.    ACTIVITY LIMITATIONS carrying, lifting, bending, sitting, standing, and squatting   PARTICIPATION LIMITATIONS: meal prep, cleaning, driving, shopping, community activity, and occupation   PERSONAL FACTORS Profession and 1 comorbidity: presently pregnant  are also affecting patient's functional outcome.    REHAB POTENTIAL: Excellent   CLINICAL DECISION MAKING: Evolving/moderate complexity   EVALUATION COMPLEXITY: Moderate     GOALS: Goals reviewed with patient? Yes   SHORT TERM GOALS: Target date: 05/15/2022   Patient independent with her stretches to mobilize the nerve through the tissue.  Baseline:not educated yet Goal status: INITIAL   2.  Patient able to sit with pain decreased </= 4/10 Baseline: pain level 3-8/10 Goal status: INITIAL     LONG TERM GOALS: Target date: 07/10/2022    Patient independent with advanced HEP for hip and SI joint strength to assist in reduction in pain Baseline: Not educated yet Goal status: INITIAL   2.  Patient reports her urinary leakage is minimal to none with urge to void, coughing, sneezing, laughing, exercise, lifting due to full circular contraction of the pelvic floor and strength >/= 3/5.  Baseline: strength is 2/5 anterior and posterior, 1/5 laterally and not a circular contraction.  Goal status: INITIAL   3.  Patient has the same sensation of the left gluteal fold down to the posterior left knee and along the left perineum due to the release of the nerves being releaseed.  Baseline: Patient sensation is a tingling sensation which is not the same as the right.  Goal status: INITIAL   4.  Patient pelvis is correct alignment due to  her exercising to stabilize the pelvis during daily activity. .  Baseline: left ilium is posteriorly rotated and sacrum rotated left.  Goal status: INITIAL   5.  Patients pain at the end of the day is </= 2/10 due to reduction of nerve symptoms, pelvis in correct alignment, and  increased stability.  Baseline: end of day pain could be 8/10.  Goal status: INITIAL     PLAN: PT FREQUENCY: 1x/week   PT DURATION: 12 weeks   PLANNED INTERVENTIONS: Therapeutic exercises, Therapeutic activity, Neuromuscular re-education, Patient/Family education, Joint mobilization, Spinal mobilization, Cryotherapy, Moist heat, Taping, Biofeedback, and Manual therapy By signing I understand that I am ordering/authorizing the patient to be a participant of the Have Less Pain, Gain Info, and Be Healthy During Your Pregnancy class as a component of this plan of care.    PLAN FOR NEXT SESSION: mobilization to the aycocks canal, manual release along the labia, hamstring stretch; check pelvic alignment; squatting to stand with row. Thoracic rotation, check lumbar ROM     Earlie Counts, PT 04/29/22 10:16 AM

## 2022-05-06 ENCOUNTER — Encounter: Payer: Self-pay | Admitting: Physical Therapy

## 2022-05-06 ENCOUNTER — Encounter: Payer: Medicaid Other | Admitting: Physical Therapy

## 2022-05-06 DIAGNOSIS — M6281 Muscle weakness (generalized): Secondary | ICD-10-CM | POA: Diagnosis not present

## 2022-05-06 DIAGNOSIS — R102 Pelvic and perineal pain: Secondary | ICD-10-CM | POA: Diagnosis not present

## 2022-05-08 ENCOUNTER — Encounter: Payer: Medicaid Other | Admitting: Physical Therapy

## 2022-05-15 ENCOUNTER — Encounter: Payer: Medicaid Other | Admitting: Physical Therapy

## 2022-05-22 ENCOUNTER — Encounter: Payer: Medicaid Other | Admitting: Physical Therapy

## 2022-05-26 ENCOUNTER — Encounter: Payer: Self-pay | Admitting: Nurse Practitioner

## 2022-05-26 DIAGNOSIS — E039 Hypothyroidism, unspecified: Secondary | ICD-10-CM

## 2022-05-28 LAB — T4, FREE: Free T4: 1.27 ng/dL (ref 0.82–1.77)

## 2022-05-28 LAB — TSH: TSH: 2.38 u[IU]/mL (ref 0.450–4.500)

## 2022-06-09 MED ORDER — LEVOTHYROXINE SODIUM 75 MCG PO TABS: 75.0000 ug | ORAL_TABLET | Freq: Every day | ORAL | 0 refills | Status: DC

## 2022-06-10 DIAGNOSIS — M9903 Segmental and somatic dysfunction of lumbar region: Secondary | ICD-10-CM | POA: Diagnosis not present

## 2022-06-10 DIAGNOSIS — M9904 Segmental and somatic dysfunction of sacral region: Secondary | ICD-10-CM | POA: Diagnosis not present

## 2022-06-10 DIAGNOSIS — M9905 Segmental and somatic dysfunction of pelvic region: Secondary | ICD-10-CM | POA: Diagnosis not present

## 2022-06-10 DIAGNOSIS — M5386 Other specified dorsopathies, lumbar region: Secondary | ICD-10-CM | POA: Diagnosis not present

## 2022-06-12 DIAGNOSIS — M9905 Segmental and somatic dysfunction of pelvic region: Secondary | ICD-10-CM | POA: Diagnosis not present

## 2022-06-12 DIAGNOSIS — M9903 Segmental and somatic dysfunction of lumbar region: Secondary | ICD-10-CM | POA: Diagnosis not present

## 2022-06-12 DIAGNOSIS — M5386 Other specified dorsopathies, lumbar region: Secondary | ICD-10-CM | POA: Diagnosis not present

## 2022-06-12 DIAGNOSIS — M9904 Segmental and somatic dysfunction of sacral region: Secondary | ICD-10-CM | POA: Diagnosis not present

## 2022-06-24 DIAGNOSIS — Z3A23 23 weeks gestation of pregnancy: Secondary | ICD-10-CM | POA: Diagnosis not present

## 2022-06-24 DIAGNOSIS — O2622 Pregnancy care for patient with recurrent pregnancy loss, second trimester: Secondary | ICD-10-CM | POA: Diagnosis not present

## 2022-06-24 DIAGNOSIS — O99282 Endocrine, nutritional and metabolic diseases complicating pregnancy, second trimester: Secondary | ICD-10-CM | POA: Diagnosis not present

## 2022-06-24 DIAGNOSIS — Z362 Encounter for other antenatal screening follow-up: Secondary | ICD-10-CM | POA: Diagnosis not present

## 2022-07-01 ENCOUNTER — Encounter: Payer: Medicaid Other | Attending: Obstetrics and Gynecology | Admitting: Physical Therapy

## 2022-07-01 ENCOUNTER — Encounter: Payer: Self-pay | Admitting: Physical Therapy

## 2022-07-01 DIAGNOSIS — M6281 Muscle weakness (generalized): Secondary | ICD-10-CM | POA: Diagnosis not present

## 2022-07-01 DIAGNOSIS — M5441 Lumbago with sciatica, right side: Secondary | ICD-10-CM | POA: Insufficient documentation

## 2022-07-01 DIAGNOSIS — R102 Pelvic and perineal pain: Secondary | ICD-10-CM | POA: Insufficient documentation

## 2022-07-01 DIAGNOSIS — R252 Cramp and spasm: Secondary | ICD-10-CM | POA: Diagnosis not present

## 2022-07-01 NOTE — Therapy (Signed)
OUTPATIENT PHYSICAL THERAPY TREATMENT NOTE   Patient Name: Olivia Shepard MRN: [id number] DOB:1991-05-12, 31 y.o., female Today's Date: 07/01/2022  PCP: Dorris Singh, MD REFERRING PROVIDER: Jaquita Folds, MD  END OF SESSION:   PT End of Session - 07/01/22 1415     Visit Number 4    Date for PT Re-Evaluation 07/10/22    Authorization Type [place] MEDICAID HEALTHY BLUE., has used 10 this year    Authorization Time Period 05/22/22-07/20/22    Authorization - Visit Number 1    Authorization - Number of Visits 5    PT Start Time 1415   came late   PT Stop Time 1500    PT Time Calculation (min) 45 min    Activity Tolerance Patient tolerated treatment well    Behavior During Therapy [facility] for tasks assessed/performed             Past Medical History:  Diagnosis Date   Anemia    Anxiety    GERD (gastroesophageal reflux disease)    Hypothyroidism    Past Surgical History:  Procedure Laterality Date   ESOPHAGEAL DILATION     Patient Active Problem List   Diagnosis Date Noted   Other intervertebral disc degeneration, lumbar region 02/09/2022   Fatigue 08/02/2021   Dysphagia 11/14/2020   Pelvic floor dysfunction 10/02/2020   Varicose veins of legs, antepartum 12/19/2019   Plantar wart, right foot 03/18/2018   GERD (gastroesophageal reflux disease) 07/25/2016   Tinea pedis of both feet 07/25/2016   Hypothyroidism 07/19/2015   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   Chronic UTI 10/24/2013   IBS (irritable bowel syndrome) 06/29/2012   REFERRING DIAG: N05.397 (ICD-10-CM) - Levator spasm   THERAPY DIAG:  Muscle weakness (generalized)   Pelvic pain   Rationale for Evaluation and Treatment Rehabilitation   PERTINENT HISTORY: Hypothyroidism,   PRECAUTIONS: Other: presently pregnant and due 10/19/2022   SUBJECTIVE: My pain is work by the end of the day. I am getting more swelling and varicose veins in the left leg. My veind are painful.  PAIN:  Are you having  pain? Yes NPRS scale: 4/10, high is 6/10 Pain location: Vaginal and left side of the vaginal wall internally and radiates to the back of left leg to the left posterior knee.    Pain type: burning and pressure and shooting down leg, feels like I have a bladder infection Pain description: constant and with varying levels     Aggravating factors: worse at the end of the day, doing a lot of activity Relieving factors: pressure behind left knee and along the left ischial tuberosity, husband pushes the fluid down the left leg.    PATIENT GOALS make pain go away   BOWEL MOVEMENT Pain with bowel movement: no  Type of bowel movement:Type (Bristol Stool Scale) Type 2 or 3, Frequency 1-2 days, and Strain no Fully empty rectum: Yes:   Fiber supplement: no   URINATION Pain with urination: Yes, constant burning Fully empty bladder: Yes:   Stream: Strong Urgency: Yes:   Frequency: 6-7 times per day Leakage: Urge to void, Coughing, Sneezing, Laughing, jumping   Pads: pads only when has a cold.   OBJECTIVE: (objective measures completed at initial evaluation unless otherwise dated)   DIAGNOSTIC FINDINGS:  From the doctors note: Pelvic floor strength 2/5, Left levator tender and taught and reproduced sensation of tenderness on the vulva and down left leg, left OI tender   PATIENT SURVEYS:  UIQ-7 19; POPIQ-7 19  COGNITION:            Overall cognitive status: Within functional limits for tasks assessed                          SENSATION:            Light touch: Deficits touch the back of left leg, medial left buttocks low, crease of left  buttocks and is tingly , and Q-tip test to the perineus has decreased sensation to the medial aspect of the left lebia for the lower 1/3, and left side between left labias.             Proprioception: Appears intact   MUSCLE LENGTH: Hamstrings: Right 55 deg; Left 65 deg   LUMBAR SPECIAL TESTS:  Straight leg raise test: Negative on the left        POSTURE:  Patient stands with a reduced lumbar lordosis.    LUMBARAROM/PROM   A/PROM A/PROM  eval  Flexion full  Extension Decreased by 25%  Right lateral flexion Decreased movement of L1-L5  Left lateral flexion Decreased movement of L1-L5  Right rotation Decreased by 25%  Left rotation Decreased by 25%   (Blank rows = not tested)   LOWER EXTREMITY ROM:   Passive ROM Right eval Left eval  Hip external rotation 68 54   (Blank rows = not tested)   LOWER EXTREMITY MMT:   MMT Right eval Left eval  Hip flexion 4/5 5/5  Hip extension 5/5 4/5  Hip abduction 3/5 3+/5  Hip adduction 5/5 4/5  Hip external rotation 4/5 5/5    PELVIC MMT:   MMT eval  Vaginal 2/5 ant/post, 1/5 laterally  (Blank rows = not tested)         PALPATION:   General  left ilium rotated posteriorly, sacrum rotated left; 05/06/2022 the pelvis in correct alignment.                  External Perineal Exam tenderness located on the medial side of the left ischiotuberosity                             Internal Pelvic Floor tightness on the left side of the pelvic floor muscles. Touching on the ischial spine reproduced her symptoms. Tenderness located on the left pelvic floor muscles.    TONE: Increased on left.    PROLAPSE: Not assessed.    TODAY'S TREATMENT  07/01/2022 Manual: Spinal mobilization:coccyx mobilization in supine with one finger int he vaginal canal and other externally to distract and move to the right Internal pelvic floor techniques:No emotional/communication barriers or cognitive limitation. Patient is motivated to learn. Patient understands and agrees with treatment goals and plan. PT explains patient will be examined in standing, sitting, and lying down to see how their muscles and joints work. When they are ready, they will be asked to remove their underwear so PT can examine their perineum. The patient is also given the option of providing their own chaperone as one is not provided  in our facility. The patient also has the right and is explained the right to defer or refuse any part of the evaluation or treatment including the internal exam. With the patient's consent, PT will use one gloved finger to gently assess the muscles of the pelvic floor, seeing how well it contracts and relaxes and if there is muscle symmetry. After, the patient  will get dressed and PT and patient will discuss exam findings and plan of care. PT and patient discuss plan of care, schedule, attendance policy and HEP activities.    Going through vaginally working on the left urogenital diaphragm to release around the perineal nerve with fascial release   Going through the vaginal canal with one finger on the aycocks canal and other along the left urogenital diaphragm to release around the pudendal nerve   Going through the vaginal canal with one finger internally on the coccygeus and sacrotuberous ligament releasing around the area    Self-care: Educated patient on how to use pool noodles to so a self release on the sacrotuberous ligament.  Educated patient on how to perform hip adductor stretch to open up the left pelvic floor    05/06/2022 Manual: Soft tissue mobilization: trigger point to right quadratus holding for 90 sec then manual mobilization to the muscle, Manual work to the left levator ani, aycocks canal, and obturator internist  Myofascial release:along the left inguinal canal Spinal mobilization: gapping of the L1-L5 in sidely, mobilization of left SI with leg pull and sidely postion moving the hip posteriorly to gap the SI joint Exercises: Stretches/mobility:sitting quadratus stretch bil. 30 sec x2 each;  hip hinge with internal rotation 15x;  Contract relax of the piriformis to reduce tightness in sidely Strengthening: Quadruped lift leg 10x each side keepping spinal neutral                                     Quadruped lift opposite arm and leg 10x each side                                                       04/29/2022 Manual: Soft tissue mobilization: to bilateral quadratus, manual work to left levator ani and obturator internists in right sidely then educated patient on how to do on herself Spinal mobilization: PA and rotational mobilization to T5-L5 in prone with pillows to make a valley for her abdomen; PA mobilization to right SI joint Exercises: Stretches/mobility:  Quadruped with cat cow to work on lumbar extension                                     Quadruped hinging hips with bilateral hip internal rotation Strengthening: Quadruped with transverse abdominus contraction                                     Quadruped with left knee on yoga block to open up the left SI joint then work on gluteus medius strength                                     Sitting with yoga block between knees and shift hips forward and backward                                   PATIENT EDUCATION:  05/06/2022 Education details:  Access Code: [email or web address] Person educated: Patient Education method: Explanation, Demonstration, Tactile cues, Verbal cues, and Handouts Education comprehension: verbalized understanding, returned demonstration, verbal cues required, tactile cues required, and needs further education     HOME EXERCISE PROGRAM: 05/06/2022 Access Code: [email or web address] URL: [email or web address] Date: 05/06/2022 Prepared by: Earlie Counts   Program Notes lay on right side and massage the left gluteal area. sit with yoga block between knees and shift hip forward and backward.    Exercises - Supine Peroneal Nerve Glide  - 2 x daily - 7 x weekly - 1 sets - 5 reps - Quadruped Yoga Block Lift Off  - 1 x daily - 7 x weekly - 10 reps - Hip Hinge Rock Back  - 1 x daily - 7 x weekly - 1 sets - 10 reps - Seated Quadratus Lumborum Stretch with Arm Overhead  - 1 x daily - 7 x weekly - 1 sets - 2 reps - 30 sec hold - Quadruped Pelvic Floor Contraction with Opposite Arm and Leg Lift  - 1  x daily - 7 x weekly - 2 sets - 10 reps    ASSESSMENT:   CLINICAL IMPRESSION: Patient is a 31 y.o. female  who was seen today for physical therapy  treatment for levator spasm. No pain with bowel movement now.  Patient has increased her thyroid medication and has helped with the constipation. Patient wears the SI belt most days and helps with SI pain. Patient leaks urine with Urge to void, Coughing, Sneezing, Laughing, jumping. Patient had less irritation on her veins in left leg after manual work. Her left medial ankle had less discoloration after the manual work. She had increased mobility of the coccyx. She had nerve pain when the alcock canal was touched and exit of the left perineal nerve. Patient will benefit from skilled therapy to reduce pain, improve leakage and muscle imbalances.    OBJECTIVE IMPAIRMENTS decreased activity tolerance, decreased coordination, decreased strength, increased fascial restrictions, increased muscle spasms, impaired tone, and pain.    ACTIVITY LIMITATIONS carrying, lifting, bending, sitting, standing, and squatting   PARTICIPATION LIMITATIONS: meal prep, cleaning, driving, shopping, community activity, and occupation   PERSONAL FACTORS Profession and 1 comorbidity: presently pregnant  are also affecting patient's functional outcome.    REHAB POTENTIAL: Excellent   CLINICAL DECISION MAKING: Evolving/moderate complexity   EVALUATION COMPLEXITY: Moderate     GOALS: Goals reviewed with patient? Yes   SHORT TERM GOALS: Target date: 05/15/2022   Patient independent with her stretches to mobilize the nerve through the tissue.  Baseline:not educated yet Goal status: met 05/06/2022   2.  Patient able to sit with pain decreased </= 4/10 Baseline: pain level 3-8/10 Goal status: met 05/06/2022     LONG TERM GOALS: Target date: 07/10/2022    Patient independent with advanced HEP for hip and SI joint strength to assist in reduction in pain Baseline: Not  educated yet Goal status: INITIAL   2.  Patient reports her urinary leakage is minimal to none with urge to void, coughing, sneezing, laughing, exercise, lifting due to full circular contraction of the pelvic floor and strength >/= 3/5.  Baseline: strength is 2/5 anterior and posterior, 1/5 laterally and not a circular contraction.  Goal status: INITIAL   3.  Patient has the same sensation of the left gluteal fold down to the posterior left knee and along the left perineum due to the release of the nerves being releaseed.  Baseline: Patient sensation is a tingling  sensation which is not the same as the right.  Goal status: INITIAL   4.  Patient pelvis is correct alignment due to her exercising to stabilize the pelvis during daily activity. .  Baseline: left ilium is posteriorly rotated and sacrum rotated left.  Goal status: INITIAL   5.  Patients pain at the end of the day is </= 2/10 due to reduction of nerve symptoms, pelvis in correct alignment, and increased stability.  Baseline: end of day pain could be 8/10.  Goal status: INITIAL     PLAN: PT FREQUENCY: 1x/week   PT DURATION: 12 weeks   PLANNED INTERVENTIONS: Therapeutic exercises, Therapeutic activity, Neuromuscular re-education, Patient/Family education, Joint mobilization, Spinal mobilization, Cryotherapy, Moist heat, Taping, Biofeedback, and Manual therapy By signing I understand that I am ordering/authorizing the patient to be a participant of the Have Less Pain, Gain Info, and Be Healthy During Your Pregnancy class as a component of this plan of care.    PLAN FOR NEXT SESSION: mobilization to the aycocks canal, manual release along the labia, hamstring stretch;  Thoracic rotation with open book,  check lumbar ROM, add hip abduction strength  Earlie Counts, PT 07/01/22 3:57 PM

## 2022-07-08 ENCOUNTER — Encounter: Payer: Self-pay | Admitting: Physical Therapy

## 2022-07-08 ENCOUNTER — Encounter: Payer: Medicaid Other | Admitting: Physical Therapy

## 2022-07-08 DIAGNOSIS — M5441 Lumbago with sciatica, right side: Secondary | ICD-10-CM | POA: Diagnosis not present

## 2022-07-08 DIAGNOSIS — R252 Cramp and spasm: Secondary | ICD-10-CM

## 2022-07-08 DIAGNOSIS — M6281 Muscle weakness (generalized): Secondary | ICD-10-CM | POA: Diagnosis not present

## 2022-07-08 DIAGNOSIS — R102 Pelvic and perineal pain: Secondary | ICD-10-CM | POA: Diagnosis not present

## 2022-07-08 NOTE — Therapy (Signed)
OUTPATIENT PHYSICAL THERAPY TREATMENT NOTE   Patient Name: Olivia Shepard MRN: [id number] DOB:1991-03-16, 31 y.o., female Today's Date: 07/08/2022  PCP: Dorris Singh, MD REFERRING PROVIDER: Jaquita Folds, MD    END OF SESSION:   PT End of Session - 07/08/22 1602     Visit Number 5    Number of Visits 13    Date for PT Re-Evaluation 07/10/22    Authorization Type [place] MEDICA[employer]., has used 10 this year    Authorization Time Period 05/22/22-07/20/22    Authorization - Visit Number 2    Authorization - Number of Visits 5    PT Start Time 1600    PT Stop Time 1645    PT Time Calculation (min) 45 min    Activity Tolerance Patient tolerated treatment well    Behavior During Therapy WFL for tasks assessed/performed             Past Medical History:  Diagnosis Date   Anemia    Anxiety    GERD (gastroesophageal reflux disease)    Hypothyroidism    Past Surgical History:  Procedure Laterality Date   ESOPHAGEAL DILATION     Patient Active Problem List   Diagnosis Date Noted   Other intervertebral disc degeneration, lumbar region 02/09/2022   Fatigue 08/02/2021   Dysphagia 11/14/2020   Pelvic floor dysfunction 10/02/2020   Varicose veins of legs, antepartum 12/19/2019   Plantar wart, right foot 03/18/2018   GERD (gastroesophageal reflux disease) 07/25/2016   Tinea pedis of both feet 07/25/2016   Hypothyroidism 07/19/2015   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   Chronic UTI 10/24/2013   IBS (irritable bowel syndrome) 06/29/2012   REFERRING DIAG: W97.948 (ICD-10-CM) - Levator spasm   THERAPY DIAG:  Muscle weakness (generalized)   Pelvic pain   Rationale for Evaluation and Treatment Rehabilitation   PERTINENT HISTORY: Hypothyroidism,   PRECAUTIONS: Other: presently pregnant and due 10/19/2022   SUBJECTIVE: I felt good till Saturday and had increased spasms Saturday night. I have trouble stretching my left leg and making it straight.  Urinary leakage is the same.  PAIN:  Are you having pain? Yes NPRS scale: 4/10 Pain location: Vaginal and left side of the vaginal wall internally and radiates to the back of left leg to the left posterior knee.    Pain type: burning and pressure and shooting down leg, feels like I have a bladder infection Pain description: constant and with varying levels     Aggravating factors: worse at the end of the day, doing a lot of activity Relieving factors: pressure behind left knee and along the left ischial tuberosity, husband pushes the fluid down the left leg.  PAIN:  Are you having pain? Yes: NPRS scale: 4/10 Pain location: back on left leg Pain description: constant, throbbing Aggravating factors: straighten left leg, standing upright Relieving factors: elevating leg. Pressure behind left knee.     PATIENT GOALS make pain go away   BOWEL MOVEMENT Pain with bowel movement: no  Type of bowel movement:Type (Bristol Stool Scale) Type 2 or 3, Frequency 1-2 days, and Strain no Fully empty rectum: Yes:   Fiber supplement: no   URINATION Pain with urination: Yes, constant burning Fully empty bladder: Yes:   Stream: Strong Urgency: Yes:   Frequency: 6-7 times per day Leakage: Urge to void, Coughing, Sneezing, Laughing, jumping   Pads: pads only when has a cold.   OBJECTIVE: (objective measures completed at initial evaluation unless otherwise dated)  DIAGNOSTIC FINDINGS:  From the doctors note: Pelvic floor strength 2/5, Left levator tender and taught and reproduced sensation of tenderness on the vulva and down left leg, left OI tender   PATIENT SURVEYS:  UIQ-7 19; POPIQ-7 19   COGNITION:            Overall cognitive status: Within functional limits for tasks assessed                          SENSATION:            Light touch: Deficits touch the back of left leg, medial left buttocks low, crease of left  buttocks and is tingly , and Q-tip test to the perineus has decreased  sensation to the medial aspect of the left lebia for the lower 1/3, and left side between left labias.             Proprioception: Appears intact   MUSCLE LENGTH: Hamstrings: Right 55 deg; Left 65 deg   LUMBAR SPECIAL TESTS:  Straight leg raise test: Negative on the left       POSTURE:  Patient stands with a reduced lumbar lordosis.    LUMBARAROM/PROM   A/PROM A/PROM  eval 07/08/2022  Flexion full full  Extension Decreased by 25% full  Right lateral flexion Decreased movement of L1-L5 Decreased by 25%  Left lateral flexion Decreased movement of L1-L5 full  Right rotation Decreased by 25% Decreased by 25%  Left rotation Decreased by 25% Decreased by 25%   (Blank rows = not tested)   LOWER EXTREMITY ROM:   Passive ROM Right eval Left eval 07/08/2022  Hip external rotation 68 54    (Blank rows = not tested)   LOWER EXTREMITY MMT:   MMT Right eval Left eval 07/08/2022 07/08/2022  Hip flexion 4/5 5/5    Hip extension 5/5 4/5    Hip abduction 3/5 3+/5 4/5 4/5  Hip adduction 5/5 4/5    Hip external rotation 4/5 5/5      PELVIC MMT:   MMT eval 07/08/2022  Vaginal 2/5 ant/post, 1/5 laterally 3/5 with a weak hug  (Blank rows = not tested)         PALPATION:   General  left ilium rotated posteriorly, sacrum rotated left; 05/06/2022 the pelvis in correct alignment.  07/08/2022 left ilium is rotated posteriorly                 External Perineal Exam tenderness located on the medial side of the left ischiotuberosity                             Internal Pelvic Floor tightness on the left side of the pelvic floor muscles. Touching on the ischial spine reproduced her symptoms. Tenderness located on the left pelvic floor muscles.    TONE: Increased on left.    PROLAPSE: Not assessed.    TODAY'S TREATMENT  07/08/2022 Manual: Soft tissue mobilization: Neural tension stretch to left LE in right sidely Spinal mobilization: L1-L5 to gap both sides  of the facets, muscle energy  to correct left ilium Internal pelvic floor techniques:No emotional/communication barriers or cognitive limitation. Patient is motivated to learn. Patient understands and agrees with treatment goals and plan. PT explains patient will be examined in standing, sitting, and lying down to see how their muscles and joints work. When they are ready, they will be asked to remove their  underwear so PT can examine their perineum. The patient is also given the option of providing their own chaperone as one is not provided in our facility. The patient also has the right and is explained the right to defer or refuse any part of the evaluation or treatment including the internal exam. With the patient's consent, PT will use one gloved finger to gently assess the muscles of the pelvic floor, seeing how well it contracts and relaxes and if there is muscle symmetry. After, the patient will get dressed and PT and patient will discuss exam findings and plan of care. PT and patient discuss plan of care, schedule, attendance policy and HEP activities.  Going through the vaginal opening releasing around the urethra sphincter and where the anterior clitoral nerve is Manual work to the obturator internist wihti moving the left hip Manual work with one finger internal and other externally working on the aycocks canal, along the iliococcygeus, and piriformis monitoring for pain and releases of the tissue on the left side.  Neuromuscular re-education:    07/01/2022 Manual: Spinal mobilization:coccyx mobilization in supine with one finger int he vaginal canal and other externally to distract and move to the right Internal pelvic floor techniques:No emotional/communication barriers or cognitive limitation. Patient is motivated to learn. Patient understands and agrees with treatment goals and plan. PT explains patient will be examined in standing, sitting, and lying down to see how their muscles and joints work. When they are ready,  they will be asked to remove their underwear so PT can examine their perineum. The patient is also given the option of providing their own chaperone as one is not provided in our facility. The patient also has the right and is explained the right to defer or refuse any part of the evaluation or treatment including the internal exam. With the patient's consent, PT will use one gloved finger to gently assess the muscles of the pelvic floor, seeing how well it contracts and relaxes and if there is muscle symmetry. After, the patient will get dressed and PT and patient will discuss exam findings and plan of care. PT and patient discuss plan of care, schedule, attendance policy and HEP activities.                          Going through vaginally working on the left urogenital diaphragm to release around the perineal nerve with fascial release                         Going through the vaginal canal with one finger on the aycocks canal and other along the left urogenital diaphragm to release around the pudendal nerve                         Going through the vaginal canal with one finger internally on the coccygeus and sacrotuberous ligament releasing around the area                          Self-care: Educated patient on how to use pool noodles to so a self release on the sacrotuberous ligament.  Educated patient on how to perform hip adductor stretch to open up the left pelvic floor    05/06/2022 Manual: Soft tissue mobilization: trigger point to right quadratus holding for 90 sec then manual mobilization to the muscle, Manual work to the left  levator ani, aycocks canal, and obturator internist  Myofascial release:along the left inguinal canal Spinal mobilization: gapping of the L1-L5 in sidely, mobilization of left SI with leg pull and sidely postion moving the hip posteriorly to gap the SI joint Exercises: Stretches/mobility:sitting quadratus stretch bil. 30 sec x2 each;  hip hinge with internal rotation  15x;  Contract relax of the piriformis to reduce tightness in sidely Strengthening: Quadruped lift leg 10x each side keepping spinal neutral                                     Quadruped lift opposite arm and leg 10x each side                                                      PATIENT EDUCATION:  05/06/2022 Education details: Access Code: [email or web address] Person educated: Patient Education method: Explanation, Demonstration, Tactile cues, Verbal cues, and Handouts Education comprehension: verbalized understanding, returned demonstration, verbal cues required, tactile cues required, and needs further education     HOME EXERCISE PROGRAM: 05/06/2022 Access Code: [email or web address] URL: [email or web address] Date: 05/06/2022 Prepared by: Earlie Counts   Program Notes lay on right side and massage the left gluteal area. sit with yoga block between knees and shift hip forward and backward.    Exercises - Supine Peroneal Nerve Glide  - 2 x daily - 7 x weekly - 1 sets - 5 reps - Quadruped Yoga Block Lift Off  - 1 x daily - 7 x weekly - 10 reps - Hip Hinge Rock Back  - 1 x daily - 7 x weekly - 1 sets - 10 reps - Seated Quadratus Lumborum Stretch with Arm Overhead  - 1 x daily - 7 x weekly - 1 sets - 2 reps - 30 sec hold - Quadruped Pelvic Floor Contraction with Opposite Arm and Leg Lift  - 1 x daily - 7 x weekly - 2 sets - 10 reps    ASSESSMENT:   CLINICAL IMPRESSION: Patient is a 31 y.o. female  who was seen today for physical therapy  treatment for levator spasm. No pain with bowel movement now.  Patient pelvis was in correct alignment after treatment. Her pain decreased from 4/10 to 2/10. She has trigger points along the pudendal nerve distribution. She has increased in pelvic floor strength to 3/5 with a weak hug of therapist finger. Increased in bilateral hip abduction. She has restrictions in the L3-L5 lumbar facets. She continues to leak urine. Patient reports she had intense pain that  kept her up throughout the night. Patient will benefit from skilled therapy to reduce pain, improve leakage and muscle imbalances.    OBJECTIVE IMPAIRMENTS decreased activity tolerance, decreased coordination, decreased strength, increased fascial restrictions, increased muscle spasms, impaired tone, and pain.    ACTIVITY LIMITATIONS carrying, lifting, bending, sitting, standing, and squatting   PARTICIPATION LIMITATIONS: meal prep, cleaning, driving, shopping, community activity, and occupation   PERSONAL FACTORS Profession and 1 comorbidity: presently pregnant  are also affecting patient's functional outcome.    REHAB POTENTIAL: Excellent   CLINICAL DECISION MAKING: Evolving/moderate complexity   EVALUATION COMPLEXITY: Moderate     GOALS: Goals reviewed with patient? Yes   SHORT TERM GOALS: Target date: 05/15/2022   Patient independent with  her stretches to mobilize the nerve through the tissue.  Baseline:not educated yet Goal status: met 05/06/2022   2.  Patient able to sit with pain decreased </= 4/10 Baseline: pain level 3-8/10 Goal status: met 05/06/2022     LONG TERM GOALS: Target date: 07/10/2022    Patient independent with advanced HEP for hip and SI joint strength to assist in reduction in pain Baseline: Not educated yet Goal status: INITIAL   2.  Patient reports her urinary leakage is minimal to none with urge to void, coughing, sneezing, laughing, exercise, lifting due to full circular contraction of the pelvic floor and strength >/= 3/5.  Baseline: strength is 2/5 anterior and posterior, 1/5 laterally and not a circular contraction.  Goal status: INITIAL   3.  Patient has the same sensation of the left gluteal fold down to the posterior left knee and along the left perineum due to the release of the nerves being releaseed.  Baseline: Patient sensation is a tingling sensation which is not the same as the right.  Goal status: INITIAL   4.  Patient pelvis is correct  alignment due to her exercising to stabilize the pelvis during daily activity. .  Baseline: left ilium is posteriorly rotated and sacrum rotated left.  Goal status: INITIAL   5.  Patients pain at the end of the day is </= 2/10 due to reduction of nerve symptoms, pelvis in correct alignment, and increased stability.  Baseline: end of day pain could be 8/10.  Goal status: INITIAL     PLAN: PT FREQUENCY: 1x/week   PT DURATION: 12 weeks   PLANNED INTERVENTIONS: Therapeutic exercises, Therapeutic activity, Neuromuscular re-education, Patient/Family education, Joint mobilization, Spinal mobilization, Cryotherapy, Moist heat, Taping, Biofeedback, and Manual therapy By signing I understand that I am ordering/authorizing the patient to be a participant of the Have Less Pain, Gain Info, and Be Healthy During Your Pregnancy class as a component of this plan of care.    PLAN FOR NEXT SESSION: mobilization to the aycocks canal, manual release along the labia, hamstring stretch;  Thoracic rotation with open book, add hip abduction strength    Earlie Counts, PT 07/08/22 5:06 PM

## 2022-07-15 ENCOUNTER — Encounter: Payer: Self-pay | Admitting: Physical Therapy

## 2022-07-15 ENCOUNTER — Encounter: Payer: Medicaid Other | Attending: Obstetrics and Gynecology | Admitting: Physical Therapy

## 2022-07-15 ENCOUNTER — Telehealth: Payer: Self-pay | Admitting: Physical Therapy

## 2022-07-15 DIAGNOSIS — R102 Pelvic and perineal pain: Secondary | ICD-10-CM | POA: Diagnosis not present

## 2022-07-15 DIAGNOSIS — M6281 Muscle weakness (generalized): Secondary | ICD-10-CM | POA: Diagnosis not present

## 2022-07-15 NOTE — Therapy (Signed)
OUTPATIENT PHYSICAL THERAPY TREATMENT NOTE   Patient Name: Olivia Shepard MRN: [id number] DOB:Mar 10, 1991, 31 y.o., female Today's Date: 07/15/2022  PCP: Dorris Singh, MD REFERRING PROVIDER: Jaquita Folds, MD  END OF SESSION:   PT End of Session - 07/15/22 1406     Visit Number 6    Date for PT Re-Evaluation 10/07/22    Authorization Type [place] MEDICA[employer] BLUE., has used 10 this year    Authorization Time Period 05/22/22-07/20/22    Authorization - Visit Number 6    Authorization - Number of Visits 5    PT Start Time 1400    PT Stop Time 1455    PT Time Calculation (min) 55 min    Activity Tolerance Patient tolerated treatment well    Behavior During Therapy WFL for tasks assessed/performed             Past Medical History:  Diagnosis Date   Anemia    Anxiety    GERD (gastroesophageal reflux disease)    Hypothyroidism    Past Surgical History:  Procedure Laterality Date   ESOPHAGEAL DILATION     Patient Active Problem List   Diagnosis Date Noted   Other intervertebral disc degeneration, lumbar region 02/09/2022   Fatigue 08/02/2021   Dysphagia 11/14/2020   Pelvic floor dysfunction 10/02/2020   Varicose veins of legs, antepartum 12/19/2019   Plantar wart, right foot 03/18/2018   GERD (gastroesophageal reflux disease) 07/25/2016   Tinea pedis of both feet 07/25/2016   Hypothyroidism 07/19/2015   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   Chronic UTI 10/24/2013   IBS (irritable bowel syndrome) 06/29/2012   REFERRING DIAG: M42.683 (ICD-10-CM) - Levator spasm   THERAPY DIAG:  Muscle weakness (generalized)   Pelvic pain   Rationale for Evaluation and Treatment Rehabilitation   PERTINENT HISTORY: Hypothyroidism,   PRECAUTIONS: Other: presently pregnant and due 10/19/2022   SUBJECTIVE: I am trying compression hose but has not helped for above the compression.  PAIN:  Are you having pain? Yes NPRS scale: 4/10 Pain location: Vaginal and left  side of the vaginal wall internally and radiates to the back of left leg to the left posterior knee.    Pain type: burning and pressure and shooting down leg, feels like I have a bladder infection Pain description: constant and with varying levels     Aggravating factors: worse at the end of the day, doing a lot of activity Relieving factors: pressure behind left knee and along the left ischial tuberosity, husband pushes the fluid down the left leg.  PAIN:  Are you having pain? Yes: NPRS scale: 4/10 Pain location: back on left leg Pain description: constant, throbbing Aggravating factors: straighten left leg, standing upright Relieving factors: elevating leg. Pressure behind left knee.      PATIENT GOALS make pain go away   BOWEL MOVEMENT Pain with bowel movement: no  Type of bowel movement:Type (Bristol Stool Scale) Type 2 or 3, Frequency 1-2 days, and Strain no Fully empty rectum: Yes:   Fiber supplement: no   URINATION Pain with urination: Yes, constant burning Fully empty bladder: Yes:   Stream: Strong Urgency: Yes:   Frequency: 6-7 times per day Leakage: Urge to void, Coughing, Sneezing, Laughing, jumping   Pads: pads only when has a cold.   OBJECTIVE: (objective measures completed at initial evaluation unless otherwise dated)   DIAGNOSTIC FINDINGS:  From the doctors note: Pelvic floor strength 2/5, Left levator tender and taught and reproduced sensation of tenderness  on the vulva and down left leg, left OI tender   PATIENT SURVEYS:  UIQ-7 19; POPIQ-7 19   COGNITION:            Overall cognitive status: Within functional limits for tasks assessed                          SENSATION:            Light touch: Deficits touch the back of left leg, medial left buttocks low, crease of left  buttocks and is tingly , and Q-tip test to the perineus has decreased sensation to the medial aspect of the left lebia for the lower 1/3, and left side between left labias.              Proprioception: Appears intact   MUSCLE LENGTH: Hamstrings: Right 55 deg; Left 65 deg   LUMBAR SPECIAL TESTS:  Straight leg raise test: Negative on the left       POSTURE:  Patient stands with a reduced lumbar lordosis.    LUMBARAROM/PROM   A/PROM A/PROM  eval 07/08/2022  Flexion full full  Extension Decreased by 25% full  Right lateral flexion Decreased movement of L1-L5 Decreased by 25%  Left lateral flexion Decreased movement of L1-L5 full  Right rotation Decreased by 25% Decreased by 25%  Left rotation Decreased by 25% Decreased by 25%   (Blank rows = not tested)   LOWER EXTREMITY ROM:   Passive ROM Right eval Left eval 07/15/2022  Hip external rotation 68 54 70    (Blank rows = not tested)   LOWER EXTREMITY MMT:   MMT Right eval Left eval 07/15/2022 right 07/15/2022 Left   Hip flexion 4/5 5/5  5/5 5/5   Hip extension 5/5 4/5 5/5  4/5   Hip abduction 3/5 3+/5 4/5 4/5  Hip adduction 5/5 4/5 5/5  4/5   Hip external rotation 4/5 5/5  5/5  5/5    PELVIC MMT:   MMT eval 07/08/2022  Vaginal 2/5 ant/post, 1/5 laterally 3/5 with a weak hug  (Blank rows = not tested)         PALPATION:   General  left ilium rotated posteriorly, sacrum rotated left; 05/06/2022 the pelvis in correct alignment.  07/08/2022 left ilium is rotated posteriorly 07/15/2022 increased pooling of the left lateral ankle and gets progressively worse.                  External Perineal Exam tenderness located on the medial side of the left ischiotuberosity                             Internal Pelvic Floor tightness on the left side of the pelvic floor muscles. Touching on the ischial spine reproduced her symptoms. Tenderness located on the left pelvic floor muscles.    TONE: Increased on left.    PROLAPSE: Not assessed.    TODAY'S TREATMENT  07/15/2022 Manual: Myofascial release:along the left lower abdomen and round ligament, along the inguinal ligament Spinal mobilization:muscle energy  technique to correct left ilum, right sidely distract the left SI joint and rotated anteriorly.  Internal pelvic floor techniques:No emotional/communication barriers or cognitive limitation. Patient is motivated to learn. Patient understands and agrees with treatment goals and plan. PT explains patient will be examined in standing, sitting, and lying down to see how their muscles and joints work. When they are ready,  they will be asked to remove their underwear so PT can examine their perineum. The patient is also given the option of providing their own chaperone as one is not provided in our facility. The patient also has the right and is explained the right to defer or refuse any part of the evaluation or treatment including the internal exam. With the patient's consent, PT will use one gloved finger to gently assess the muscles of the pelvic floor, seeing how well it contracts and relaxes and if there is muscle symmetry. After, the patient will get dressed and PT and patient will discuss exam findings and plan of care. PT and patient discuss plan of care, schedule, attendance policy and HEP activities.  Going through the vaginal opening with one finger internally and other externally releasing around the pudendal nerve and anterior clitoral nerve while monitoring for pain.  Self care;  Placed baby belly band on patient with straps to lift up on the perineum and assisted in her pain. Went over how it works and it may be an option for her to reduce the pooling of the veins and pain.  07/08/2022 Manual: Soft tissue mobilization: Neural tension stretch to left LE in right sidely Spinal mobilization: L1-L5 to gap both sides  of the facets, muscle energy to correct left ilium Internal pelvic floor techniques:No emotional/communication barriers or cognitive limitation. Patient is motivated to learn. Patient understands and agrees with treatment goals and plan. PT explains patient will be examined in standing,  sitting, and lying down to see how their muscles and joints work. When they are ready, they will be asked to remove their underwear so PT can examine their perineum. The patient is also given the option of providing their own chaperone as one is not provided in our facility. The patient also has the right and is explained the right to defer or refuse any part of the evaluation or treatment including the internal exam. With the patient's consent, PT will use one gloved finger to gently assess the muscles of the pelvic floor, seeing how well it contracts and relaxes and if there is muscle symmetry. After, the patient will get dressed and PT and patient will discuss exam findings and plan of care. PT and patient discuss plan of care, schedule, attendance policy and HEP activities.  Going through the vaginal opening releasing around the urethra sphincter and where the anterior clitoral nerve is Manual work to the obturator internist wihti moving the left hip Manual work with one finger internal and other externally working on the aycocks canal, along the iliococcygeus, and piriformis monitoring for pain and releases of the tissue on the left side.  Neuromuscular re-education:     07/01/2022 Manual: Spinal mobilization:coccyx mobilization in supine with one finger int he vaginal canal and other externally to distract and move to the right Internal pelvic floor techniques:No emotional/communication barriers or cognitive limitation. Patient is motivated to learn. Patient understands and agrees with treatment goals and plan. PT explains patient will be examined in standing, sitting, and lying down to see how their muscles and joints work. When they are ready, they will be asked to remove their underwear so PT can examine their perineum. The patient is also given the option of providing their own chaperone as one is not provided in our facility. The patient also has the right and is explained the right to defer or  refuse any part of the evaluation or treatment including the internal exam. With the patient's consent,  PT will use one gloved finger to gently assess the muscles of the pelvic floor, seeing how well it contracts and relaxes and if there is muscle symmetry. After, the patient will get dressed and PT and patient will discuss exam findings and plan of care. PT and patient discuss plan of care, schedule, attendance policy and HEP activities.                          Going through vaginally working on the left urogenital diaphragm to release around the perineal nerve with fascial release                         Going through the vaginal canal with one finger on the aycocks canal and other along the left urogenital diaphragm to release around the pudendal nerve                         Going through the vaginal canal with one finger internally on the coccygeus and sacrotuberous ligament releasing around the area                          Self-care: Educated patient on how to use pool noodles to so a self release on the sacrotuberous ligament.  Educated patient on how to perform hip adductor stretch to open up the left pelvic floor                                                PATIENT EDUCATION:  05/06/2022 Education details: Access Code: [email or web address] Person educated: Patient Education method: Explanation, Demonstration, Tactile cues, Verbal cues, and Handouts Education comprehension: verbalized understanding, returned demonstration, verbal cues required, tactile cues required, and needs further education     HOME EXERCISE PROGRAM: 05/06/2022 Access Code: [email or web address] URL: [email or web address] Date: 05/06/2022 Prepared by: Earlie Counts   Program Notes lay on right side and massage the left gluteal area. sit with yoga block between knees and shift hip forward and backward.    Exercises - Supine Peroneal Nerve Glide  - 2 x daily - 7 x weekly - 1 sets - 5 reps - Quadruped Yoga Block Lift  Off  - 1 x daily - 7 x weekly - 10 reps - Hip Hinge Rock Back  - 1 x daily - 7 x weekly - 1 sets - 10 reps - Seated Quadratus Lumborum Stretch with Arm Overhead  - 1 x daily - 7 x weekly - 1 sets - 2 reps - 30 sec hold - Quadruped Pelvic Floor Contraction with Opposite Arm and Leg Lift  - 1 x daily - 7 x weekly - 2 sets - 10 reps    ASSESSMENT:   CLINICAL IMPRESSION: Patient is a 31 y.o. female  who was seen today for physical therapy  treatment for levator spasm. urinary leakage is 20% better.  Patient has increased strength in bilateral hips. Patient is having shooting pain but no tingling and numbnessPatient reports she had intense pain that kept her up throughout the night. Patient pain has increased and causes her to cry by the end. Pelvic floor strength increased to 3/5 with good hug of the therapist finger. Patient has major reduction of pain after therapy and lasts for several days.  Her pain decreased to 1/10 after therapy. She has trigger points in the alcock's canal and will refer pain down her left leg. She has a trigger point in the left side of the superior transverse perineum that will refer pain into the cliorus and cuse the burning pain like a UTI. Increased pooling of the left lateral ankle and gets progressively worse. Patient will benefit from skilled therapy to reduce pain, improve leakage and muscle imbalances.    OBJECTIVE IMPAIRMENTS decreased activity tolerance, decreased coordination, decreased strength, increased fascial restrictions, increased muscle spasms, impaired tone, and pain.    ACTIVITY LIMITATIONS carrying, lifting, bending, sitting, standing, and squatting   PARTICIPATION LIMITATIONS: meal prep, cleaning, driving, shopping, community activity, and occupation   PERSONAL FACTORS Profession and 1 comorbidity: presently pregnant  are also affecting patient's functional outcome.    REHAB POTENTIAL: Excellent   CLINICAL DECISION MAKING: Evolving/moderate  complexity   EVALUATION COMPLEXITY: Moderate     GOALS: Goals reviewed with patient? Yes   SHORT TERM GOALS: Target date: 05/15/2022   Patient independent with her stretches to mobilize the nerve through the tissue.  Baseline:not educated yet Goal status: met 05/06/2022   2.  Patient able to sit with pain decreased </= 4/10 Baseline: pain level 3-8/10 Goal status: met 05/06/2022     LONG TERM GOALS: Target date: 07/10/2022    Patient independent with advanced HEP for hip and SI joint strength to assist in reduction in pain Baseline: Not educated yet Goal status: Ongoing 07/15/2022.    2.  Patient reports her urinary leakage is minimal to none with urge to void, coughing, sneezing, laughing, exercise, lifting due to full circular contraction of the pelvic floor and strength >/= 3/5.  Baseline: strength is 2/5 anterior and posterior, 1/5 laterally and not a circular contraction. Urinary leakage is 20% better.  Goal status: ongoing 07/15/2022   3.  Patient has the same sensation of the left gluteal fold down to the posterior left knee and along the left perineum due to the release of the nerves being releaseed.  Baseline: Patient is having shooting pain but no tingling and numbness Goal status: Ongoing. 07/15/2022   4.  Patient pelvis is correct alignment due to her exercising to stabilize the pelvis during daily activity. .  Baseline: left ilium is posteriorly rotated and sacrum rotated left.  Goal status: INITIAL   5.  Patients pain at the end of the day is </= 2/10 due to reduction of nerve symptoms, pelvis in correct alignment, and increased stability.  Baseline: end of day pain could be 8/10.  Goal status: ongoing 07/15/2022     PLAN: PT FREQUENCY: 1x/week   PT DURATION: 12 weeks   PLANNED INTERVENTIONS: Therapeutic exercises, Therapeutic activity, Neuromuscular re-education, Patient/Family education, Joint mobilization, Spinal mobilization, Cryotherapy, Moist heat, Taping,  Biofeedback, and Manual therapy By signing I understand that I am ordering/authorizing the patient to be a participant of the Have Less Pain, Gain Info, and Be Healthy During Your Pregnancy class as a component of this plan of care.    PLAN FOR NEXT SESSION: mobilization to the aycocks canal, manual release along the labia, hamstring stretch;  Thoracic rotation with open book   Earlie Counts, PT 07/15/22 3:04 PM

## 2022-07-15 NOTE — Telephone Encounter (Signed)
Called patient about her missed appointment today at 8:30. Left a message.  Earlie Counts, PT '@9'$ /03/2022@ 8:49 AM

## 2022-07-18 DIAGNOSIS — E039 Hypothyroidism, unspecified: Secondary | ICD-10-CM | POA: Diagnosis not present

## 2022-07-19 LAB — TSH: TSH: 1.32 u[IU]/mL (ref 0.450–4.500)

## 2022-07-19 LAB — T4, FREE: Free T4: 1.19 ng/dL (ref 0.82–1.77)

## 2022-07-22 ENCOUNTER — Encounter: Payer: Self-pay | Admitting: Family Medicine

## 2022-07-22 DIAGNOSIS — Z3482 Encounter for supervision of other normal pregnancy, second trimester: Secondary | ICD-10-CM | POA: Diagnosis not present

## 2022-07-22 DIAGNOSIS — Z3A27 27 weeks gestation of pregnancy: Secondary | ICD-10-CM | POA: Diagnosis not present

## 2022-07-22 DIAGNOSIS — Z23 Encounter for immunization: Secondary | ICD-10-CM | POA: Diagnosis not present

## 2022-07-22 DIAGNOSIS — L282 Other prurigo: Secondary | ICD-10-CM | POA: Diagnosis not present

## 2022-07-22 DIAGNOSIS — Z3689 Encounter for other specified antenatal screening: Secondary | ICD-10-CM | POA: Diagnosis not present

## 2022-07-28 NOTE — Patient Instructions (Signed)

## 2022-07-29 ENCOUNTER — Encounter: Payer: Self-pay | Admitting: Nurse Practitioner

## 2022-07-29 ENCOUNTER — Ambulatory Visit: Payer: Medicaid Other | Admitting: Nurse Practitioner

## 2022-07-29 VITALS — BP 101/70 | Ht 67.0 in | Wt 162.2 lb

## 2022-07-29 DIAGNOSIS — E039 Hypothyroidism, unspecified: Secondary | ICD-10-CM | POA: Diagnosis not present

## 2022-07-29 MED ORDER — LEVOTHYROXINE SODIUM 75 MCG PO TABS
75.0000 ug | ORAL_TABLET | Freq: Every day | ORAL | 0 refills | Status: DC
Start: 2022-07-29 — End: 2022-09-09

## 2022-07-29 NOTE — Progress Notes (Signed)
Endocrinology Follow Up Note                                         07/29/2022, 11:30 AM  Subjective:   Subjective    Olivia Shepard is a 31 y.o.-year-old female patient being seen in follow up after being seen in consultation for hypothyroidism referred by Martyn Malay, MD.   Past Medical History:  Diagnosis Date   Anemia    Anxiety    GERD (gastroesophageal reflux disease)    Hypothyroidism     Past Surgical History:  Procedure Laterality Date   ESOPHAGEAL DILATION      Social History   Socioeconomic History   Marital status: Married    Spouse name: Not on file   Number of children: Not on file   Years of education: Not on file   Highest education level: Not on file  Occupational History   Not on file  Tobacco Use   Smoking status: Never   Smokeless tobacco: Never  Vaping Use   Vaping Use: Never used  Substance and Sexual Activity   Alcohol use: No    Alcohol/week: 0.0 standard drinks of alcohol   Drug use: No   Sexual activity: Yes    Birth control/protection: None  Other Topics Concern   Not on file  Social History Narrative   Works as Marine scientist on L and D at [employer] 4 children, 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   Social Determinants of Health   Financial Resource Strain: Not on file  Food Insecurity: Not on file  Transportation Needs: Not on file  Physical Activity: Not on file  Stress: Not on file  Social Connections: Not on file    Family History  Problem Relation Age of Onset   Diabetes Mother    Atrial fibrillation Father    Prostate cancer Father    Lung cancer Maternal Grandmother    Colon cancer Paternal Great-grandfather    Stomach cancer Paternal Great-grandfather    Colon cancer Paternal Great-grandmother    Esophageal cancer Neg Hx     Outpatient Encounter Medications as of 07/29/2022  Medication Sig   lidocaine-prilocaine (EMLA) cream Apply 1 application.  topically as needed (for urethral irritation).   pantoprazole (PROTONIX) 40 MG tablet Take 40 mg by mouth daily.   Prenatal Vit-Fe Fumarate-FA (PRENATAL MULTIVITAMIN) TABS tablet Take 1 tablet by mouth daily.   [DISCONTINUED] levothyroxine (SYNTHROID) 75 MCG tablet Take 1 tablet (75 mcg total) by mouth daily before breakfast.   levothyroxine (SYNTHROID) 75 MCG tablet Take 1 tablet (75 mcg total) by mouth daily before breakfast.   No facility-administered encounter medications on file as of 07/29/2022.    ALLERGIES: No Known Allergies VACCINATION STATUS: Immunization History  Administered Date(s) Administered   DTaP 11/10/1989   Hepatitis B 08/06/2000, 01/28/2001, 10/27/2005   Hepatitis B, PED/ADOLESCENT 08/06/2000, 08/06/2000, 01/28/2001, 01/28/2001, 10/27/2005, 10/27/2005   Influenza Split 07/28/2012   Influenza, Seasonal, Injecte, Preservative Fre 07/28/2012, 08/03/2013, 08/11/2015, 08/18/2017,  08/27/2018   Influenza,inj,Quad PF,6+ Mos 08/27/2018, 06/27/2019, 08/02/2021   Influenza,inj,Quad PF,6-35 Mos 06/27/2019   Influenza,inj,quad, With Preservative 07/28/2012, 08/03/2013, 08/11/2015, 08/18/2017, 08/27/2018   Influenza-Unspecified 07/28/2012, 08/03/2013, 08/11/2015, 08/18/2017, 06/27/2019   MMR 02/17/1991, 02/09/1995, 09/18/2016   Meningococcal Conjugate 06/27/2008   Meningococcal Polysaccharide 06/27/2008   PFIZER Comirnaty(Gray Top)Covid-19 Tri-Sucrose Vaccine 12/25/2020   PFIZER(Purple Top)SARS-COV-2 Vaccination 07/11/2020   PPD Test 06/14/2008, 02/03/2011, 02/13/2012   Td 04/25/2005   Tdap 02/09/1995, 08/10/2014, 08/29/2016, 04/30/2020     HPI   Olivia Shepard  is a patient with the above medical history. she was diagnosed with hypothyroidism a few years ago as she and her husband were trying to conceive a child.  She was started on Levothyroxine 25 mcg po daily as a result.  She has been tested for thyroid antibodies several times, negative.  She is currently pregnant  (around 13 weeks) and is concerned about her TSH levels as she has had several miscarriages previously (once in 2020, one in October 2022, and Jan 2023).  She asked for referral to endocrinology to help manage this to promote a healthy developing fetus.  I reviewed patient's thyroid tests:  Lab Results  Component Value Date   TSH 1.320 07/18/2022   TSH 2.380 05/27/2022   TSH 2.200 04/14/2022   TSH 2.47 03/20/2022   TSH 2.29 03/03/2022   TSH 2.480 08/02/2021   TSH 1.810 07/10/2020   TSH 2.360 08/27/2018   TSH 2.920 08/31/2015   TSH 1.850 07/19/2015   FREET4 1.19 07/18/2022   FREET4 1.27 05/27/2022   FREET4 1.18 04/14/2022   FREET4 0.98 08/31/2015     Pt describes: - weight gain - fatigue - cold intolerance  Pt denies feeling nodules in neck, hoarseness, dysphagia/odynophagia, SOB with lying down.  she does have family history of thyroid disorders in her mom (recently diagnosed).  No family history of thyroid cancer. No history of radiation therapy to head or neck.  No recent use of iodine supplements.  Denies use of Biotin containing supplements.  I reviewed her chart and she also has a history of GERD (had to have esophagus stretched), anemia, anxiety.   ROS:  Constitutional: + weight gain (reports faster weight gain with this pregnancy than her previous pregnancies), + fatigue-imprived, no subjective hyperthermia, + subjective hypothermia-improved Eyes: no blurry vision, no xerophthalmia ENT: no sore throat, no nodules palpated in throat, no dysphagia/odynophagia, no hoarseness Cardiovascular: no chest pain, no SOB, no palpitations, no leg swelling Respiratory: no cough, no SOB Gastrointestinal: no nausea/vomiting/diarrhea Musculoskeletal: no muscle/joint aches Skin: no rashes Neurological: no tremors, no numbness, no tingling, no dizziness Psychiatric: no depression, no anxiety   Objective:   Objective     BP 101/70 (BP Location: Left Arm, Patient Position:  Sitting, Cuff Size: Normal)   Ht '5\' 7"'  (1.702 m)   Wt 162 lb 3.2 oz (73.6 kg)   LMP 11/21/2021   BMI 25.40 kg/m  Wt Readings from Last 3 Encounters:  07/29/22 162 lb 3.2 oz (73.6 kg)  04/14/22 148 lb (67.1 kg)  04/09/22 146 lb (66.2 kg)    BP Readings from Last 3 Encounters:  07/29/22 101/70  04/14/22 106/73  04/09/22 108/73      Physical Exam- Limited  Constitutional:  Body mass index is 25.4 kg/m. , not in acute distress, normal state of mind Eyes:  EOMI, no exophthalmos Neck: Supple Cardiovascular: RRR, no murmurs, rubs, or gallops, no edema Respiratory: Adequate breathing efforts, no crackles, rales, rhonchi, or wheezing Musculoskeletal: no  gross deformities, strength intact in all four extremities, no gross restriction of joint movements Skin:  no rashes, no hyperemia Neurological: no tremor with outstretched hands   CMP ( most recent) CMP     Component Value Date/Time   NA 143 08/02/2021 0937   K 5.1 08/02/2021 0937   CL 103 08/02/2021 0937   CO2 26 08/02/2021 0937   GLUCOSE 68 08/02/2021 0937   GLUCOSE 71 12/27/2015 1501   BUN 18 08/02/2021 0937   CREATININE 0.67 08/02/2021 0937   CREATININE 0.55 12/27/2015 1501   CALCIUM 10.0 08/02/2021 0937   PROT 7.1 08/02/2021 0937   ALBUMIN 4.8 08/02/2021 0937   AST 11 08/02/2021 0937   ALT 12 08/02/2021 0937   ALKPHOS 62 08/02/2021 0937   BILITOT 0.3 08/02/2021 0937   GFRNONAA 109 08/27/2018 1021   GFRNONAA >89 12/27/2015 1501   GFRAA 125 08/27/2018 1021   GFRAA >89 12/27/2015 1501     Diabetic Labs (most recent): No results found for: "HGBA1C", "MICROALBUR"   Lipid Panel ( most recent) Lipid Panel     Component Value Date/Time   CHOL 144 12/27/2015 1501   TRIG 32 12/27/2015 1501   HDL 83 12/27/2015 1501   CHOLHDL 1.7 12/27/2015 1501   VLDL 6 12/27/2015 1501   LDLCALC 55 12/27/2015 1501       Lab Results  Component Value Date   TSH 1.320 07/18/2022   TSH 2.380 05/27/2022   TSH 2.200  04/14/2022   TSH 2.47 03/20/2022   TSH 2.29 03/03/2022   TSH 2.480 08/02/2021   TSH 1.810 07/10/2020   TSH 2.360 08/27/2018   TSH 2.920 08/31/2015   TSH 1.850 07/19/2015   FREET4 1.19 07/18/2022   FREET4 1.27 05/27/2022   FREET4 1.18 04/14/2022   FREET4 0.98 08/31/2015      Assessment & Plan:   ASSESSMENT / PLAN:  1. Hypothyroidism-acquired   Patient with long-standing hypothyroidism, on levothyroxine therapy. On physical exam, patient  does not have gross goiter, thyroid nodules, or neck compression symptoms.  Her antibody testing was negative, ruling out autoimmune thyroid dysfunction.    Her previsit thyroid function tests are consistent with appropriate hormone replacement.  She is advised to continue Levothyroxine 75 mcg po daily before breakfast.  - We discussed about correct intake of levothyroxine, at fasting, with water, separated by at least 30 minutes from breakfast, and separated by more than 4 hours from calcium, iron, multivitamins, acid reflux medications (PPIs). -Patient is made aware of the fact that thyroid hormone replacement is needed for life, dose to be adjusted by periodic monitoring of thyroid function tests.   -Due to absence of clinical goiter, no need for thyroid ultrasound at this time.     I spent 22 minutes in the care of the patient today including review of labs from Thyroid Function, CMP, and other relevant labs ; imaging/biopsy records (current and previous including abstractions from other facilities); face-to-face time discussing  her lab results and symptoms, medications doses, her options of short and long term treatment based on the latest standards of care / guidelines;   and documenting the encounter.  Olivia Shepard  participated in the discussions, expressed understanding, and voiced agreement with the above plans.  All questions were answered to her satisfaction. she is encouraged to contact clinic should she have any questions or concerns  prior to her return visit.   FOLLOW UP PLAN:  Return in about 6 weeks (around 09/09/2022) for Thyroid follow up, Previsit  labs, Virtual visit ok.  Rayetta Pigg, [facility] [address] [place], [place] [address] Phone: [phone] Fax: [phone]  07/29/2022, 11:30 AM

## 2022-07-30 ENCOUNTER — Encounter: Payer: Self-pay | Admitting: Physical Therapy

## 2022-07-30 ENCOUNTER — Telehealth: Payer: Self-pay | Admitting: Family Medicine

## 2022-07-30 ENCOUNTER — Encounter: Payer: Self-pay | Admitting: Family Medicine

## 2022-07-30 ENCOUNTER — Ambulatory Visit: Payer: Medicaid Other | Admitting: Family Medicine

## 2022-07-30 ENCOUNTER — Ambulatory Visit: Payer: Medicaid Other | Admitting: Physical Therapy

## 2022-07-30 VITALS — BP 117/75 | HR 89 | Wt 159.2 lb

## 2022-07-30 DIAGNOSIS — M6281 Muscle weakness (generalized): Secondary | ICD-10-CM

## 2022-07-30 DIAGNOSIS — R102 Pelvic and perineal pain: Secondary | ICD-10-CM

## 2022-07-30 DIAGNOSIS — R238 Other skin changes: Secondary | ICD-10-CM

## 2022-07-30 DIAGNOSIS — R21 Rash and other nonspecific skin eruption: Secondary | ICD-10-CM

## 2022-07-30 MED ORDER — TRIAMCINOLONE ACETONIDE 0.1 % EX CREA: 1.0000 | TOPICAL_CREAM | Freq: Two times a day (BID) | CUTANEOUS | 1 refills | Status: DC

## 2022-07-30 NOTE — Progress Notes (Unsigned)
    SUBJECTIVE:    Rash  For last 2-3 weeks.  Started on upper chest and a little on her abdomen.  Since has spread over all extremities with small bumps that become small vesicles that are very itchy and the resolve in a few days.  Tried triamcinolone from her OB that helps a little.  Overall feels well.  Continues to have normal fetal movement. No fever, fatigue, mucous membrane involvement, or new medications or known contact with insects or plants.   Never had this before   PERTINENT  PMH / PSH:  Thyroid, IBS,   OBJECTIVE:   BP 117/75   Pulse 89   Wt 159 lb 3.2 oz (72.2 kg)   LMP 11/21/2021   SpO2 100%   BMI 24.93 kg/m   No distress Scattered small erythematous papules in different stages with perhaps one small vesicle on L finger.  Mostly they are on her extremities None currently on her abdomen  Picture from her phone shows large vesicle on her R forearm that now appears to be a hyperpigmented macular lesion Mouth - no lesions, mucous membranes are moist, no decaying teeth   Neck:  No deformities, thyromegaly, masses, or tenderness noted.   Supple with full range of motion without pain.  Consulted Drs Owens Shark and Gwendlyn Deutscher who examined her  ASSESSMENT/PLAN:   Rash Broad differential including contact, unknown viral rash, pemphigoid gestationis (although never very prominent on abdomen) atopic eruption of pregnancy (although no history of atopic derm and skin not particularly dry).  Does not really fit PUPP or ICP.  Given possibility of pemphigoid Dr Owens Shark her PCP will reach out to her OB Cautioned if any systemic symptoms to notify us immediately     Lind Covert, [place]

## 2022-07-30 NOTE — Patient Instructions (Signed)
Good to see you today - Thank you for coming in  Things we discussed today:  Dr Owens Shark will contact your OB about close follow up for the possible causes of your rash  We will check labs to see if you could have celiac disease   I or Dr Owens Shark will follow up with you about your labs

## 2022-07-30 NOTE — Telephone Encounter (Signed)
Called patient's Ob Gyn Dr. Terri Piedra at her request. Discussed clinical concerns regarding possible causes of her rash including atopic eruption of pregnancy, dermatitis herpetiformis, pemphigoid gestationis. Relayed through her nurse.   Dorris Singh, MD  Family Medicine Teaching Service

## 2022-07-30 NOTE — Therapy (Addendum)
OUTPATIENT PHYSICAL THERAPY TREATMENT NOTE   Patient Name: Olivia Shepard MRN: [id number] DOB:May 15, 1991, 31 y.o., female Today's Date: 07/30/2022  PCP:  Dorris Singh, MD REFERRING PROVIDER: Jaquita Folds, MD  END OF SESSION:   PT End of Session - 07/30/22 1711     Visit Number 7    Date for PT Re-Evaluation 10/07/22    Authorization Type [place] [employer]ICA[employer]., has used 10 this year    Authorization Time Period 07/15/2022-08/13/2022    Authorization - Visit Number 1    Authorization - Number of Visits 4    PT Start Time 1630    PT Stop Time 1700    PT Time Calculation (min) 30 min    Activity Tolerance Patient tolerated treatment well    Behavior During Therapy WFL for tasks assessed/performed             Past Medical History:  Diagnosis Date   Anemia    Anxiety    GERD (gastroesophageal reflux disease)    Hypothyroidism    Past Surgical History:  Procedure Laterality Date   ESOPHAGEAL DILATION     Patient Active Problem List   Diagnosis Date Noted   Other intervertebral disc degeneration, lumbar region 02/09/2022   Fatigue 08/02/2021   Dysphagia 11/14/2020   Pelvic floor dysfunction 10/02/2020   Varicose veins of legs, antepartum 12/19/2019   Plantar wart, right foot 03/18/2018   GERD (gastroesophageal reflux disease) 07/25/2016   Tinea pedis of both feet 07/25/2016   Hypothyroidism 07/19/2015   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   Chronic UTI 10/24/2013   IBS (irritable bowel syndrome) 06/29/2012   REFERRING DIAG: E36.629 (ICD-10-CM) - Levator spasm   THERAPY DIAG:  Muscle weakness (generalized)   Pelvic pain   Rationale for Evaluation and Treatment Rehabilitation   PERTINENT HISTORY: Hypothyroidism,   PRECAUTIONS: Other: presently pregnant and due 10/19/2022   SUBJECTIVE: The pain has been better after the round ligamen.  PAIN:  Are you having pain? Yes NPRS scale: 2/10 Pain location: Vaginal and left side of the vaginal  wall internally and radiates to the back of left leg to the left posterior knee.    Pain type: burning and pressure and shooting down leg, feels like I have a bladder infection Pain description: constant and with varying levels     Aggravating factors: worse at the end of the day, doing a lot of activity Relieving factors: pressure behind left knee and along the left ischial tuberosity, husband pushes the fluid down the left leg.  PAIN:  Are you having pain? Yes: NPRS scale: 4/10 Pain location: back on left leg Pain description: constant, throbbing Aggravating factors: straighten left leg, standing upright Relieving factors: elevating leg. Pressure behind left knee.      PATIENT GOALS make pain go away   BOWEL MOVEMENT Pain with bowel movement: no  Type of bowel movement:Type (Bristol Stool Scale) Type 2 or 3, Frequency 1-2 days, and Strain no Fully empty rectum: Yes:   Fiber supplement: no   URINATION Pain with urination: Yes, constant burning Fully empty bladder: Yes:   Stream: Strong Urgency: Yes:   Frequency: 6-7 times per day Leakage: Urge to void, Coughing, Sneezing, Laughing, jumping   Pads: pads only when has a cold.   OBJECTIVE: (objective measures completed at initial evaluation unless otherwise dated)   DIAGNOSTIC FINDINGS:  From the doctors note: Pelvic floor strength 2/5, Left levator tender and taught and reproduced sensation of tenderness on the vulva  and down left leg, left OI tender   PATIENT SURVEYS:  UIQ-7 19; POPIQ-7 19   COGNITION:            Overall cognitive status: Within functional limits for tasks assessed                          SENSATION:            Light touch: Deficits touch the back of left leg, medial left buttocks low, crease of left  buttocks and is tingly , and Q-tip test to the perineus has decreased sensation to the medial aspect of the left lebia for the lower 1/3, and left side between left labias.             Proprioception: Appears  intact   MUSCLE LENGTH: Hamstrings: Right 55 deg; Left 65 deg   LUMBAR SPECIAL TESTS:  Straight leg raise test: Negative on the left       POSTURE:  Patient stands with a reduced lumbar lordosis.    LUMBARAROM/PROM   A/PROM A/PROM  eval 07/08/2022  Flexion full full  Extension Decreased by 25% full  Right lateral flexion Decreased movement of L1-L5 Decreased by 25%  Left lateral flexion Decreased movement of L1-L5 full  Right rotation Decreased by 25% Decreased by 25%  Left rotation Decreased by 25% Decreased by 25%   (Blank rows = not tested)   LOWER EXTREMITY ROM:   Passive ROM Right eval Left eval 07/15/2022  Hip external rotation 68 54 70    (Blank rows = not tested)   LOWER EXTREMITY MMT:   MMT Right eval Left eval 07/15/2022 right 07/15/2022 Left   Hip flexion 4/5 5/5  5/5 5/5   Hip extension 5/5 4/5 5/5  4/5   Hip abduction 3/5 3+/5 4/5 4/5  Hip adduction 5/5 4/5 5/5  4/5   Hip external rotation 4/5 5/5  5/5  5/5    PELVIC MMT:   MMT eval 07/08/2022  Vaginal 2/5 ant/post, 1/5 laterally 3/5 with a weak hug  (Blank rows = not tested)         PALPATION:   General  left ilium rotated posteriorly, sacrum rotated left; 05/06/2022 the pelvis in correct alignment.  07/08/2022 left ilium is rotated posteriorly 07/15/2022 increased pooling of the left lateral ankle and gets progressively worse.                  External Perineal Exam tenderness located on the medial side of the left ischiotuberosity                             Internal Pelvic Floor tightness on the left side of the pelvic floor muscles. Touching on the ischial spine reproduced her symptoms. Tenderness located on the left pelvic floor muscles.    TONE: Increased on left.    PROLAPSE: Not assessed.    TODAY'S TREATMENT  07/30/2022 Manual: Myofascial release:release of the round ligament in sidely Release of the inguinal ligament in sidely Quadruped release the fundus with on hand suprapubically  and other on the sacrum then after 5 min. Have patient lift the one hip 3 times each side.  Manual work on the left ishial tuberosity and along the gluteal fold releasing the area.     07/15/2022 Manual: Myofascial release:along the left lower abdomen and round ligament, along the inguinal ligament Spinal mobilization:muscle energy technique to  correct left ilum, right sidely distract the left SI joint and rotated anteriorly.  Internal pelvic floor techniques:No emotional/communication barriers or cognitive limitation. Patient is motivated to learn. Patient understands and agrees with treatment goals and plan. PT explains patient will be examined in standing, sitting, and lying down to see how their muscles and joints work. When they are ready, they will be asked to remove their underwear so PT can examine their perineum. The patient is also given the option of providing their own chaperone as one is not provided in our facility. The patient also has the right and is explained the right to defer or refuse any part of the evaluation or treatment including the internal exam. With the patient's consent, PT will use one gloved finger to gently assess the muscles of the pelvic floor, seeing how well it contracts and relaxes and if there is muscle symmetry. After, the patient will get dressed and PT and patient will discuss exam findings and plan of care. PT and patient discuss plan of care, schedule, attendance policy and HEP activities.  Going through the vaginal opening with one finger internally and other externally releasing around the pudendal nerve and anterior clitoral nerve while monitoring for pain.  Self care;  Placed baby belly band on patient with straps to lift up on the perineum and assisted in her pain. Went over how it works and it may be an option for her to reduce the pooling of the veins and pain.   07/08/2022 Manual: Soft tissue mobilization: Neural tension stretch to left LE in right  sidely Spinal mobilization: L1-L5 to gap both sides  of the facets, muscle energy to correct left ilium Internal pelvic floor techniques:No emotional/communication barriers or cognitive limitation. Patient is motivated to learn. Patient understands and agrees with treatment goals and plan. PT explains patient will be examined in standing, sitting, and lying down to see how their muscles and joints work. When they are ready, they will be asked to remove their underwear so PT can examine their perineum. The patient is also given the option of providing their own chaperone as one is not provided in our facility. The patient also has the right and is explained the right to defer or refuse any part of the evaluation or treatment including the internal exam. With the patient's consent, PT will use one gloved finger to gently assess the muscles of the pelvic floor, seeing how well it contracts and relaxes and if there is muscle symmetry. After, the patient will get dressed and PT and patient will discuss exam findings and plan of care. PT and patient discuss plan of care, schedule, attendance policy and HEP activities.  Going through the vaginal opening releasing around the urethra sphincter and where the anterior clitoral nerve is Manual work to the obturator internist wihti moving the left hip Manual work with one finger internal and other externally working on the aycocks canal, along the iliococcygeus, and piriformis monitoring for pain and releases of the tissue on the left side.                                          PATIENT EDUCATION:  05/06/2022 Education details: Access Code: [email or web address] Person educated: Patient Education method: Explanation, Demonstration, Tactile cues, Verbal cues, and Handouts Education comprehension: verbalized understanding, returned demonstration, verbal cues required, tactile cues required, and needs further education  HOME EXERCISE PROGRAM: 05/06/2022 Access Code:  [email or web address] URL: [email or web address] Date: 05/06/2022 Prepared by: Earlie Counts   Program Notes lay on right side and massage the left gluteal area. sit with yoga block between knees and shift hip forward and backward.    Exercises - Supine Peroneal Nerve Glide  - 2 x daily - 7 x weekly - 1 sets - 5 reps - Quadruped Yoga Block Lift Off  - 1 x daily - 7 x weekly - 10 reps - Hip Hinge Rock Back  - 1 x daily - 7 x weekly - 1 sets - 10 reps - Seated Quadratus Lumborum Stretch with Arm Overhead  - 1 x daily - 7 x weekly - 1 sets - 2 reps - 30 sec hold - Quadruped Pelvic Floor Contraction with Opposite Arm and Leg Lift  - 1 x daily - 7 x weekly - 2 sets - 10 reps    ASSESSMENT:   CLINICAL IMPRESSION: Patient is a 31 y.o. female  who was seen today for physical therapy  treatment for levator spasm. Pain level is 2/10 compared to 4/10. Urinary leakage is 20% better and only happened with urgency and the baby is pressing on the bladder.   Patient is using compression hose that go up to her abdomen and are helping with the veins and pressure on the pelvic floor. Patient had no pain after therapy. Patient will benefit from skilled therapy to reduce pain, improve leakage and muscle imbalances.    OBJECTIVE IMPAIRMENTS decreased activity tolerance, decreased coordination, decreased strength, increased fascial restrictions, increased muscle spasms, impaired tone, and pain.    ACTIVITY LIMITATIONS carrying, lifting, bending, sitting, standing, and squatting   PARTICIPATION LIMITATIONS: meal prep, cleaning, driving, shopping, community activity, and occupation   PERSONAL FACTORS Profession and 1 comorbidity: presently pregnant  are also affecting patient's functional outcome.    REHAB POTENTIAL: Excellent   CLINICAL DECISION MAKING: Evolving/moderate complexity   EVALUATION COMPLEXITY: Moderate     GOALS: Goals reviewed with patient? Yes   SHORT TERM GOALS: Target date: 05/15/2022    Patient independent with her stretches to mobilize the nerve through the tissue.  Baseline:not educated yet Goal status: met 05/06/2022   2.  Patient able to sit with pain decreased </= 4/10 Baseline: pain level 3-8/10 Goal status: met 05/06/2022     LONG TERM GOALS: Target date: 07/10/2022    Patient independent with advanced HEP for hip and SI joint strength to assist in reduction in pain Baseline: Not educated yet Goal status: Ongoing 07/15/2022.    2.  Patient reports her urinary leakage is minimal to none with urge to void, coughing, sneezing, laughing, exercise, lifting due to full circular contraction of the pelvic floor and strength >/= 3/5.  Baseline: strength is 2/5 anterior and posterior, 1/5 laterally and not a circular contraction. Urinary leakage is 20% better.  Goal status: ongoing 07/15/2022   3.  Patient has the same sensation of the left gluteal fold down to the posterior left knee and along the left perineum due to the release of the nerves being releaseed.  Baseline: Patient is having shooting pain but no tingling and numbness Goal status: Ongoing. 07/15/2022   4.  Patient pelvis is correct alignment due to her exercising to stabilize the pelvis during daily activity. .  Baseline: left ilium is posteriorly rotated and sacrum rotated left.  Goal status: INITIAL   5.  Patients pain at the end of the day is </= 2/10 due to  reduction of nerve symptoms, pelvis in correct alignment, and increased stability.  Baseline: end of day pain could be 8/10.  Goal status: ongoing 07/15/2022     PLAN: PT FREQUENCY: 1x/week   PT DURATION: 12 weeks   PLANNED INTERVENTIONS: Therapeutic exercises, Therapeutic activity, Neuromuscular re-education, Patient/Family education, Joint mobilization, Spinal mobilization, Cryotherapy, Moist heat, Taping, Biofeedback, and Manual therapy By signing I understand that I am ordering/authorizing the patient to be a participant of the Have Less Pain, Gain  Info, and Be Healthy During Your Pregnancy class as a component of this plan of care.    PLAN FOR NEXT SESSION: mobilization to the aycocks canal, manual release along the labia, hamstring stretch;  Thoracic rotation with open book ; round ligament release; uterosacral ligament release   Earlie Counts, PT 07/30/22 5:12 PM   PHYSICAL THERAPY DISCHARGE SUMMARY  Visits from Start of Care: 7  Current functional level related to goals / functional outcomes: See above. Patient did not return after the last visit on 07/30/2022.    Remaining deficits: See above.    Education / Equipment: HEP   Patient agrees to discharge. Patient goals were not met. Patient is being discharged due to not returning since the last visit. Thank you for the visit. Earlie Counts, PT 10/07/22 11:06 AM

## 2022-07-31 DIAGNOSIS — R21 Rash and other nonspecific skin eruption: Secondary | ICD-10-CM | POA: Insufficient documentation

## 2022-07-31 NOTE — Assessment & Plan Note (Signed)
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  Does not really fit PUPP or ICP.  Given possibility of pemphigoid Dr Owens Shark her PCP will reach out to her OB Cautioned if any systemic symptoms to notify us immediately

## 2022-08-06 ENCOUNTER — Ambulatory Visit
Admission: EM | Admit: 2022-08-06 | Discharge: 2022-08-06 | Disposition: A | Discharge status: Home / Self Care | Payer: Medicaid Other | Attending: Internal Medicine | Admitting: Internal Medicine

## 2022-08-06 DIAGNOSIS — U071 COVID-19: Secondary | ICD-10-CM | POA: Diagnosis not present

## 2022-08-06 DIAGNOSIS — Z3A29 29 weeks gestation of pregnancy: Secondary | ICD-10-CM | POA: Diagnosis not present

## 2022-08-06 DIAGNOSIS — O98513 Other viral diseases complicating pregnancy, third trimester: Secondary | ICD-10-CM | POA: Diagnosis not present

## 2022-08-06 DIAGNOSIS — B349 Viral infection, unspecified: Secondary | ICD-10-CM

## 2022-08-06 DIAGNOSIS — J029 Acute pharyngitis, unspecified: Secondary | ICD-10-CM | POA: Diagnosis not present

## 2022-08-06 LAB — RESP PANEL BY RT-PCR (FLU A&B, COVID) ARPGX2
Influenza A by PCR: NEGATIVE
Influenza B by PCR: NEGATIVE
SARS Coronavirus 2 by RT PCR: POSITIVE — AB

## 2022-08-06 LAB — POCT RAPID STREP A (OFFICE): Rapid Strep A Screen: NEGATIVE

## 2022-08-06 NOTE — ED Triage Notes (Signed)
Pt c/o sore throat, headache, malaise, onset ~ yesterday

## 2022-08-06 NOTE — ED Provider Notes (Signed)
[facility]    CSN: [id number] Arrival date & time: 08/06/22  1704      History   Chief Complaint Chief Complaint  Patient presents with   Sore Throat    HPI Olivia Shepard is a 31 y.o. female.   Patient presents with sore throat, malaise, fatigue that started yesterday.  Denies nasal congestion, runny nose, cough, fever.  Patient reports that there was a strep throat outbreak at her workplace.  Denies chest pain, shortness of breath, ear pain, nausea, vomiting, diarrhea, abdominal pain.  Patient reports she is currently [redacted] weeks pregnant so she has not taken any medications to alleviate symptoms.   Sore Throat    Past Medical History:  Diagnosis Date   Anemia    Anxiety    GERD (gastroesophageal reflux disease)    Hypothyroidism     Patient Active Problem List   Diagnosis Date Noted   Rash 07/31/2022   Other intervertebral disc degeneration, lumbar region 02/09/2022   Fatigue 08/02/2021   Dysphagia 11/14/2020   Pelvic floor dysfunction 10/02/2020   Varicose veins of legs, antepartum 12/19/2019   Plantar wart, right foot 03/18/2018   GERD (gastroesophageal reflux disease) 07/25/2016   Tinea pedis of both feet 07/25/2016   Hypothyroidism 07/19/2015   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   Chronic UTI 10/24/2013   IBS (irritable bowel syndrome) 06/29/2012    Past Surgical History:  Procedure Laterality Date   ESOPHAGEAL DILATION      OB History     Gravida  5   Para  4   Term  4   Preterm      AB      Living  4      SAB      IAB      Ectopic      Multiple  0   Live Births  3            Home Medications    Prior to Admission medications   Medication Sig Start Date End Date Taking? Authorizing Provider  levothyroxine (SYNTHROID) 75 MCG tablet Take 1 tablet (75 mcg total) by mouth daily before breakfast. 07/29/22   Brita Romp, NP  lidocaine-prilocaine (EMLA) cream Apply 1 application. topically as needed  (for urethral irritation). 04/10/22   Jaquita Folds, MD  pantoprazole (PROTONIX) 40 MG tablet Take 40 mg by mouth daily. 07/03/22   [provider]  Prenatal Vit-Fe Fumarate-FA (PRENATAL MULTIVITAMIN) TABS tablet Take 1 tablet by mouth daily.    [provider]  triamcinolone cream (KENALOG) 0.1 % Apply 1 Application topically 2 (two) times daily. 07/30/22   Lind Covert, MD    Family History Family History  Problem Relation Age of Onset   Diabetes Mother    Atrial fibrillation Father    Prostate cancer Father    Lung cancer Maternal Grandmother    Colon cancer Paternal Great-grandfather    Stomach cancer Paternal Great-grandfather    Colon cancer Paternal Great-grandmother    Esophageal cancer Neg Hx     Social History Social History   Tobacco Use   Smoking status: Never   Smokeless tobacco: Never  Vaping Use   Vaping Use: Never used  Substance Use Topics   Alcohol use: No    Alcohol/week: 0.0 standard drinks of alcohol   Drug use: No     Allergies   Patient has no known allergies.   Review of Systems Review of Systems Per HPI  Physical Exam Triage Vital Signs ED Triage Vitals  Enc Vitals Group     BP 08/06/22 1732 105/70     Pulse Rate 08/06/22 1732 100     Resp 08/06/22 1732 16     Temp 08/06/22 1732 98 F (36.7 C)     Temp Source 08/06/22 1732 Oral     SpO2 08/06/22 1732 98 %     Weight --      Height --      Head Circumference --      Peak Flow --      Pain Score 08/06/22 1733 6     Pain Loc --      Pain Edu? --      Excl. in [place]? --    No data found.  Updated Vital Signs BP 105/70 (BP Location: Right Arm)   Pulse 100   Temp 98 F (36.7 C) (Oral)   Resp 16   LMP 11/21/2021   SpO2 98%   Breastfeeding No   Visual Acuity Right Eye Distance:   Left Eye Distance:   Bilateral Distance:    Right Eye Near:   Left Eye Near:    Bilateral Near:     Physical Exam Constitutional:      General: She is not in  acute distress.    Appearance: Normal appearance. She is not toxic-appearing or diaphoretic.  HENT:     Head: Normocephalic and atraumatic.     Right Ear: Tympanic membrane and ear canal normal.     Left Ear: Tympanic membrane and ear canal normal.     Nose: No congestion.     Mouth/Throat:     Mouth: Mucous membranes are moist.     Pharynx: Posterior oropharyngeal erythema present. No pharyngeal swelling or oropharyngeal exudate.     Tonsils: No tonsillar exudate or tonsillar abscesses.  Eyes:     Extraocular Movements: Extraocular movements intact.     Conjunctiva/sclera: Conjunctivae normal.     Pupils: Pupils are equal, round, and reactive to light.  Cardiovascular:     Rate and Rhythm: Normal rate and regular rhythm.     Pulses: Normal pulses.     Heart sounds: Normal heart sounds.  Pulmonary:     Effort: Pulmonary effort is normal. No respiratory distress.     Breath sounds: Normal breath sounds. No wheezing.  Musculoskeletal:        General: Normal range of motion.     Cervical back: Normal range of motion.  Skin:    General: Skin is warm and dry.  Neurological:     General: No focal deficit present.     Mental Status: She is alert and oriented to person, place, and time. Mental status is at baseline.  Psychiatric:        Mood and Affect: Mood normal.        Behavior: Behavior normal.      UC Treatments / Results  Labs (all labs ordered are listed, but only abnormal results are displayed) Labs Reviewed  CULTURE, GROUP A STREP ([place])  RESP PANEL BY RT-PCR (FLU A&B, COVID) [id number]  POCT RAPID STREP A (OFFICE)    EKG   Radiology No results found.  Procedures Procedures (including critical care time)  Medications Ordered in UC Medications - No data to display  Initial Impression / Assessment and Plan / UC Course  I have reviewed the triage vital signs and the nursing notes.  Pertinent labs & imaging results that were available during  my care of the  patient were reviewed by me and considered in my medical decision making (see chart for details).     Rapid strep was negative.  Throat culture is pending.  Low suspicion for strep throat given appearance of posterior pharynx on exam despite possible strep exposure.  No signs of peritonsillar abscess on exam.  Most likely viral pharyngitis.  Discussed this with patient.  COVID test is also pending.  Discussed supportive care and symptom management with patient that will be safe for pregnancy.  Also advised following up with OB/GYN for any further recommendations.  Discussed return precautions.  Patient verbalized understanding and was agreeable with plan. Final Clinical Impressions(s) / UC Diagnoses   Final diagnoses:  Sore throat  Viral illness     Discharge Instructions      Rapid strep test was negative.  Throat culture, COVID-19, flu test are pending.  We will call if they are abnormal.  It appears that you have a viral illness that should run its course and self resolve as we discussed.  Please follow-up with OB/GYN for further recommendations for medications.  Please follow-up if symptoms persist or worsen.    ED Prescriptions   None    PDMP not reviewed this encounter.   Teodora Medici, [place] 08/06/22 1818

## 2022-08-06 NOTE — Discharge Instructions (Signed)
Rapid strep test was negative.  Throat culture, COVID-19, flu test are pending.  We will call if they are abnormal.  It appears that you have a viral illness that should run its course and self resolve as we discussed.  Please follow-up with OB/GYN for further recommendations for medications.  Please follow-up if symptoms persist or worsen.

## 2022-08-07 ENCOUNTER — Inpatient Hospital Stay (HOSPITAL_COMMUNITY)
Admission: AD | Admit: 2022-08-07 | Discharge: 2022-08-07 | Disposition: A | Discharge status: Home / Self Care | Payer: Medicaid Other | Attending: Obstetrics | Admitting: Obstetrics

## 2022-08-07 ENCOUNTER — Encounter (HOSPITAL_COMMUNITY): Payer: Self-pay | Admitting: *Deleted

## 2022-08-07 DIAGNOSIS — U071 COVID-19: Secondary | ICD-10-CM | POA: Insufficient documentation

## 2022-08-07 DIAGNOSIS — O98513 Other viral diseases complicating pregnancy, third trimester: Secondary | ICD-10-CM | POA: Diagnosis not present

## 2022-08-07 DIAGNOSIS — Z3A29 29 weeks gestation of pregnancy: Secondary | ICD-10-CM | POA: Insufficient documentation

## 2022-08-07 MED ORDER — CYCLOBENZAPRINE HCL 10 MG PO TABS: 10.0000 mg | ORAL_TABLET | Freq: Three times a day (TID) | ORAL | 0 refills | Status: DC | PRN

## 2022-08-07 MED ORDER — NIRMATRELVIR/RITONAVIR (PAXLOVID)TABLET: 3.0000 | ORAL_TABLET | Freq: Two times a day (BID) | ORAL | 0 refills | Status: AC

## 2022-08-07 NOTE — MAU Provider Note (Signed)
Chief Complaint: Covid Positive   None     SUBJECTIVE HPI: Olivia Shepard is a 31 y.o. [id number] who presents to maternity admissions requesting antiviral treatment for Covid 19. She was seen in Urgent care yesterday and diagnosed with Covid. She reports lower back ache, sore throat and fatigue.   + fetal movement.   Past Medical History:  Diagnosis Date   Anemia    Anxiety    GERD (gastroesophageal reflux disease)    Hypothyroidism    Past Surgical History:  Procedure Laterality Date   ESOPHAGEAL DILATION     Social History   Socioeconomic History   Marital status: Married    Spouse name: Not on file   Number of children: Not on file   Years of education: Not on file   Highest education level: Not on file  Occupational History   Not on file  Tobacco Use   Smoking status: Never   Smokeless tobacco: Never  Vaping Use   Vaping Use: Never used  Substance and Sexual Activity   Alcohol use: No    Alcohol/week: 0.0 standard drinks of alcohol   Drug use: No   Sexual activity: Yes    Birth control/protection: None  Other Topics Concern   Not on file  Social History Narrative   Works as Marine scientist on L and D at [employer] 4 children, 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   Social Determinants of Health   Financial Resource Strain: Not on file  Food Insecurity: Not on file  Transportation Needs: Not on file  Physical Activity: Not on file  Stress: Not on file  Social Connections: Not on file  Intimate Partner Violence: Not on file   No current facility-administered medications on file prior to encounter.   Current Outpatient Medications on File Prior to Encounter  Medication Sig Dispense Refill   levothyroxine (SYNTHROID) 75 MCG tablet Take 1 tablet (75 mcg total) by mouth daily before breakfast. 90 tablet 0   lidocaine-prilocaine (EMLA) cream Apply 1 application. topically as needed (for urethral irritation). 30 g 11   pantoprazole (PROTONIX) 40 MG tablet Take 40 mg by mouth  daily.     Prenatal Vit-Fe Fumarate-FA (PRENATAL MULTIVITAMIN) TABS tablet Take 1 tablet by mouth daily.     triamcinolone cream (KENALOG) 0.1 % Apply 1 Application topically 2 (two) times daily. 30 g 1   No Known Allergies  ROS:  Review of Systems  Constitutional:  Positive for chills and fatigue. Negative for fever.  HENT:  Positive for congestion, sinus pressure and sore throat.   Gastrointestinal:  Negative for abdominal pain.    I have reviewed patient's Past Medical Hx, Surgical Hx, Family Hx, Social Hx, medications and allergies.   Physical Exam  Patient Vitals for the past 24 hrs:  BP Temp Pulse Resp SpO2 Height Weight  08/07/22 1916 108/69 97.9 F (36.6 C) 87 17 100 % '5\' 7"'$  (1.702 m) 73.5 kg   Physical Exam Constitutional:      General: She is not in acute distress.    Appearance: Normal appearance. She is not ill-appearing, toxic-appearing or diaphoretic.  Cardiovascular:     Rate and Rhythm: Normal rate.  Pulmonary:     Effort: Pulmonary effort is normal. No respiratory distress.     Breath sounds: No stridor. No wheezing, rhonchi or rales.  Chest:     Chest wall: No tenderness.  Musculoskeletal:        General: Normal range of motion.  Cervical back: Normal range of motion.  Skin:    General: Skin is warm.  Neurological:     Mental Status: She is alert and oriented to person, place, and time.  Psychiatric:        Behavior: Behavior normal.    MDM  Paxlovid RX  She is without severe illness.  + fetal movement. No fetal concerns.   ASSESSMENT MSE Complete 1. Lab test positive for detection of COVID-19 virus   2. [redacted] weeks gestation of pregnancy      PLAN  Rx: Paxlovid and flexeril REST Increase fluid intake Return to MAU if symptoms worsen MFM consult d/t covid in the 3rd trimester is recommended.    Lezlie Lye, NP 08/07/2022 8:40 PM

## 2022-08-07 NOTE — Progress Notes (Signed)
Written and verbal d/c instructions given and understanding voiced. 

## 2022-08-07 NOTE — MAU Note (Signed)
Noni Saupe NP in Triage to see pt and discuss plan of care.

## 2022-08-07 NOTE — MAU Note (Addendum)
.  Olivia Shepard is a 31 y.o. at 46w4dhere in MAU reporting sore throat, headache, lower back pain since yesterday. Generally does not feel well. Covid positive last night at Urgent Care. Was told to come in for antivirals. Denies any pregnancy concerns. Good FM  Onset of complaint: yesterday Pain score: 364fhiness/6 for lower back/headache better since Tylenol Vitals:   08/07/22 1916  BP: 108/69  Pulse: 87  Resp: 17  Temp: 97.9 F (36.6 C)  SpO2: 100%     FHT:138 Lab orders placed from triage:  none

## 2022-08-09 LAB — CULTURE, GROUP A STREP (THRC)

## 2022-08-14 ENCOUNTER — Encounter: Payer: Medicaid Other | Admitting: Physical Therapy

## 2022-09-01 ENCOUNTER — Encounter: Payer: Self-pay | Admitting: *Deleted

## 2022-09-04 DIAGNOSIS — E039 Hypothyroidism, unspecified: Secondary | ICD-10-CM | POA: Diagnosis not present

## 2022-09-05 LAB — T4, FREE: Free T4: 1.11 ng/dL (ref 0.82–1.77)

## 2022-09-05 LAB — TSH: TSH: 1.7 u[IU]/mL (ref 0.450–4.500)

## 2022-09-08 NOTE — Patient Instructions (Signed)

## 2022-09-09 ENCOUNTER — Telehealth (INDEPENDENT_AMBULATORY_CARE_PROVIDER_SITE_OTHER): Payer: Medicaid Other | Admitting: Nurse Practitioner

## 2022-09-09 ENCOUNTER — Encounter: Payer: Self-pay | Admitting: Nurse Practitioner

## 2022-09-09 VITALS — BP 128/76 | Ht 67.0 in | Wt 166.6 lb

## 2022-09-09 DIAGNOSIS — E039 Hypothyroidism, unspecified: Secondary | ICD-10-CM

## 2022-09-09 MED ORDER — LEVOTHYROXINE SODIUM 75 MCG PO TABS: 75.0000 ug | ORAL_TABLET | Freq: Every day | ORAL | 0 refills | Status: DC

## 2022-09-09 NOTE — Progress Notes (Signed)
Endocrinology Follow Up Note                                         09/09/2022, 8:40 AM    TELEHEALTH VISIT: The patient is being engaged in telehealth visit.  This type of visit limits physical examination significantly, and thus is not preferable over face-to-face encounters.  I connected with  Olivia Shepard on 09/09/22 by a video enabled telemedicine application and verified that I am speaking with the correct person using two identifiers.   I discussed the limitations of evaluation and management by telemedicine. The patient expressed understanding and agreed to proceed.    The participants involved in this visit include: Brita Romp, NP located at [facility] and Shaleigh Laubscher  located at their personal residence listed.   Subjective:   Subjective    Olivia Shepard is a 31 y.o.-year-old female patient being seen in follow up after being seen in consultation for hypothyroidism referred by Martyn Malay, MD.   Past Medical History:  Diagnosis Date   Anemia    Anxiety    GERD (gastroesophageal reflux disease)    Hypothyroidism     Past Surgical History:  Procedure Laterality Date   ESOPHAGEAL DILATION      Social History   Socioeconomic History   Marital status: Married    Spouse name: Not on file   Number of children: Not on file   Years of education: Not on file   Highest education level: Not on file  Occupational History   Not on file  Tobacco Use   Smoking status: Never   Smokeless tobacco: Never  Vaping Use   Vaping Use: Never used  Substance and Sexual Activity   Alcohol use: No    Alcohol/week: 0.0 standard drinks of alcohol   Drug use: No   Sexual activity: Yes    Birth control/protection: None  Other Topics Concern   Not on file  Social History Narrative   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, 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   Social  Determinants of Health   Financial Resource Strain: Not on file  Food Insecurity: Not on file  Transportation Needs: Not on file  Physical Activity: Not on file  Stress: Not on file  Social Connections: Not on file    Family History  Problem Relation Age of Onset   Diabetes Mother    Atrial fibrillation Father    Prostate cancer Father    Lung cancer Maternal Grandmother    Colon cancer Paternal Great-grandfather    Stomach cancer Paternal Great-grandfather    Colon cancer Paternal Great-grandmother    Esophageal cancer Neg Hx     Outpatient Encounter Medications as of 09/09/2022  Medication Sig   cyclobenzaprine (FLEXERIL) 10 MG tablet Take 1 tablet (10 mg total) by mouth 3 (three) times daily as needed for muscle spasms.   lidocaine-prilocaine (EMLA) cream Apply 1 application. topically as needed (  for urethral irritation).   pantoprazole (PROTONIX) 40 MG tablet Take 40 mg by mouth daily.   Prenatal Vit-Fe Fumarate-FA (PRENATAL MULTIVITAMIN) TABS tablet Take 1 tablet by mouth daily.   [DISCONTINUED] levothyroxine (SYNTHROID) 75 MCG tablet Take 1 tablet (75 mcg total) by mouth daily before breakfast.   levothyroxine (SYNTHROID) 75 MCG tablet Take 1 tablet (75 mcg total) by mouth daily before breakfast.   triamcinolone cream (KENALOG) 0.1 % Apply 1 Application topically 2 (two) times daily. (Patient not taking: Reported on 09/09/2022)   No facility-administered encounter medications on file as of 09/09/2022.    ALLERGIES: No Known Allergies VACCINATION STATUS: Immunization History  Administered Date(s) Administered   DTaP 11/10/1989   Hepatitis B 08/06/2000, 01/28/2001, 10/27/2005   Hepatitis B, PED/ADOLESCENT 08/06/2000, 08/06/2000, 01/28/2001, 01/28/2001, 10/27/2005, 10/27/2005   Influenza Split 07/28/2012   Influenza, Seasonal, Injecte, Preservative Fre 07/28/2012, 08/03/2013, 08/11/2015, 08/18/2017, 08/27/2018   Influenza,inj,Quad PF,6+ Mos 08/27/2018, 06/27/2019,  08/02/2021   Influenza,inj,Quad PF,6-35 Mos 06/27/2019   Influenza,inj,quad, With Preservative 07/28/2012, 08/03/2013, 08/11/2015, 08/18/2017, 08/27/2018   Influenza-Unspecified 07/28/2012, 08/03/2013, 08/11/2015, 08/18/2017, 06/27/2019   MMR 02/17/1991, 02/09/1995, 09/18/2016   Meningococcal Conjugate 06/27/2008   Meningococcal Polysaccharide 06/27/2008   PFIZER Comirnaty(Gray Top)Covid-19 Tri-Sucrose Vaccine 12/25/2020   PFIZER(Purple Top)SARS-COV-2 Vaccination 07/11/2020   PPD Test 06/14/2008, 02/03/2011, 02/13/2012   Td 04/25/2005   Tdap 02/09/1995, 08/10/2014, 08/29/2016, 04/30/2020     HPI   Olivia Shepard  is a patient with the above medical history. she was diagnosed with hypothyroidism a few years ago as she and her husband were trying to conceive a child.  She was started on Levothyroxine 25 mcg po daily as a result.  She has been tested for thyroid antibodies several times, negative.  She is currently pregnant (around 13 weeks) and is concerned about her TSH levels as she has had several miscarriages previously (once in 2020, one in October 2022, and Jan 2023).  She asked for referral to endocrinology to help manage this to promote a healthy developing fetus.  I reviewed patient's thyroid tests:  Lab Results  Component Value Date   TSH 1.700 09/04/2022   TSH 1.320 07/18/2022   TSH 2.380 05/27/2022   TSH 2.200 04/14/2022   TSH 2.47 03/20/2022   TSH 2.29 03/03/2022   TSH 2.480 08/02/2021   TSH 1.810 07/10/2020   TSH 2.360 08/27/2018   TSH 2.920 08/31/2015   FREET4 1.11 09/04/2022   FREET4 1.19 07/18/2022   FREET4 1.27 05/27/2022   FREET4 1.18 04/14/2022   FREET4 0.98 08/31/2015     Pt denies feeling nodules in neck, hoarseness, dysphagia/odynophagia, SOB with lying down.  she does have family history of thyroid disorders in her mom (recently diagnosed).  No family history of thyroid cancer. No history of radiation therapy to head or neck.  No recent use of iodine  supplements.  Denies use of Biotin containing supplements.  I reviewed her chart and she also has a history of GERD (had to have esophagus stretched), anemia, anxiety.   ROS:  Constitutional: + weight gain (reports faster weight gain with this pregnancy than her previous pregnancies), + fatigue-improved, no subjective hyperthermia, + subjective hypothermia-improved Eyes: no blurry vision, no xerophthalmia ENT: no sore throat, no nodules palpated in throat, no dysphagia/odynophagia, no hoarseness Cardiovascular: no chest pain, no SOB, no palpitations, no leg swelling Respiratory: no cough, no SOB Gastrointestinal: no nausea/vomiting/diarrhea Musculoskeletal: no muscle/joint aches Skin: no rashes Neurological: no tremors, no numbness, no tingling, no dizziness Psychiatric: no depression, no  anxiety   Objective:   Objective     BP 128/76 Comment: Patient reports that this was 2 weeks ago  Ht _0  (1.702 m)   Wt 166 lb 9.6 oz (75.6 kg) Comment: Patient weighed this morning, down 2 lbs ini 2 days  LMP 11/21/2021   BMI 26.09 kg/m  Wt Readings from Last 3 Encounters:  09/09/22 166 lb 9.6 oz (75.6 kg)  08/07/22 162 lb (73.5 kg)  07/30/22 159 lb 3.2 oz (72.2 kg)    BP Readings from Last 3 Encounters:  09/09/22 128/76  08/07/22 108/69  08/06/22 105/70     Physical Exam- Telehealth- significantly limited due to nature of visit  Constitutional: Body mass index is 26.09 kg/m. , not in acute distress, normal state of mind Respiratory: Adequate breathing efforts   CMP ( most recent) CMP     Component Value Date/Time   NA 143 08/02/2021 0937   K 5.1 08/02/2021 0937   CL 103 08/02/2021 0937   CO2 26 08/02/2021 0937   GLUCOSE 68 08/02/2021 0937   GLUCOSE 71 12/27/2015 1501   BUN 18 08/02/2021 0937   CREATININE 0.67 08/02/2021 0937   CREATININE 0.55 12/27/2015 1501   CALCIUM 10.0 08/02/2021 0937   PROT 7.1 08/02/2021 0937   ALBUMIN 4.8 08/02/2021 0937   AST 11  08/02/2021 0937   ALT 12 08/02/2021 0937   ALKPHOS 62 08/02/2021 0937   BILITOT 0.3 08/02/2021 0937   GFRNONAA 109 08/27/2018 1021   GFRNONAA >89 12/27/2015 1501   GFRAA 125 08/27/2018 1021   GFRAA >89 12/27/2015 1501     Diabetic Labs (most recent): No results found for: "HGBA1C", "MICROALBUR"   Lipid Panel ( most recent) Lipid Panel     Component Value Date/Time   CHOL 144 12/27/2015 1501   TRIG 32 12/27/2015 1501   HDL 83 12/27/2015 1501   CHOLHDL 1.7 12/27/2015 1501   VLDL 6 12/27/2015 1501   LDLCALC 55 12/27/2015 1501       Lab Results  Component Value Date   TSH 1.700 09/04/2022   TSH 1.320 07/18/2022   TSH 2.380 05/27/2022   TSH 2.200 04/14/2022   TSH 2.47 03/20/2022   TSH 2.29 03/03/2022   TSH 2.480 08/02/2021   TSH 1.810 07/10/2020   TSH 2.360 08/27/2018   TSH 2.920 08/31/2015   FREET4 1.11 09/04/2022   FREET4 1.19 07/18/2022   FREET4 1.27 05/27/2022   FREET4 1.18 04/14/2022   FREET4 0.98 08/31/2015     Latest Reference Range & Units 03/20/22 00:00 04/14/22 15:04 05/27/22 14:33 07/18/22 16:05 09/04/22 14:15  TSH 0.450 - 4.500 uIU/mL 2.47 (E) 2.200 2.380 1.320 1.700  Triiodothyronine,Free,Serum 2.0 - 4.4 pg/mL  2.5     T4,Free(Direct) 0.82 - 1.77 ng/dL  1.18 1.27 1.19 1.11  Thyroperoxidase Ab SerPl-aCnc 0 - 34 IU/mL  <9     Thyroglobulin Antibody 0.0 - 0.9 IU/mL  <1.0     (E): External lab result  Assessment & Plan:   ASSESSMENT / PLAN:  1. Hypothyroidism-acquired   Patient with long-standing hypothyroidism, on levothyroxine therapy. On physical exam, patient  does not have gross goiter, thyroid nodules, or neck compression symptoms.  Her antibody testing was negative, ruling out autoimmune thyroid dysfunction.    Her previsit thyroid function tests are consistent with appropriate hormone replacement.  She is advised to continue Levothyroxine 75 mcg po daily before breakfast.  Will recheck TFTs after delivery of baby for surveillance  purposes.  - We discussed  about correct intake of levothyroxine, at fasting, with water, separated by at least 30 minutes from breakfast, and separated by more than 4 hours from calcium, iron, multivitamins, acid reflux medications (PPIs). -Patient is made aware of the fact that thyroid hormone replacement is needed for life, dose to be adjusted by periodic monitoring of thyroid function tests.   -Due to absence of clinical goiter, no need for thyroid ultrasound at this time.        I spent 20 dedicated to the care of this patient on the date of this encounter to include pre-visit review of records, face-to-face time with the patient, and post visit ordering of testing.  Olivia Shepard  participated in the discussions, expressed understanding, and voiced agreement with the above plans.  All questions were answered to her satisfaction. she is encouraged to contact clinic should she have any questions or concerns prior to her return visit.   FOLLOW UP PLAN:  Return in about 8 weeks (around 11/04/2022) for Thyroid follow up, Previsit labs, Virtual visit ok.  Rayetta Pigg, [facility] [address] [place], [place] [address] Phone: [phone] Fax: [phone]  09/09/2022, 8:40 AM

## 2022-09-23 ENCOUNTER — Encounter: Payer: Self-pay | Admitting: Nurse Practitioner

## 2022-10-01 ENCOUNTER — Encounter: Payer: Self-pay | Admitting: Family Medicine

## 2022-10-06 ENCOUNTER — Other Ambulatory Visit: Payer: Self-pay

## 2022-10-06 ENCOUNTER — Encounter: Payer: Self-pay | Admitting: Family Medicine

## 2022-10-06 ENCOUNTER — Ambulatory Visit: Payer: Medicaid Other | Admitting: Family Medicine

## 2022-10-06 VITALS — BP 113/79 | HR 93 | Wt 171.2 lb

## 2022-10-06 DIAGNOSIS — Z0000 Encounter for general adult medical examination without abnormal findings: Secondary | ICD-10-CM | POA: Diagnosis not present

## 2022-10-06 NOTE — Patient Instructions (Signed)
It was wonderful to see you today.  Please bring ALL of your medications with you to every visit.   Today we talked about:  -- Congratulations on the adoption   -- I will complete the letter for you    Please follow up in 12 months   Thank you for choosing [facility].   Please call [phone] with any questions about today's appointment.  Please be sure to schedule follow up at the front  desk before you leave today.   Dorris Singh, MD  Family Medicine

## 2022-10-06 NOTE — Progress Notes (Signed)
    SUBJECTIVE:   Chief compliant/HPI: annual examination  Olivia Shepard is a 31 y.o. who presents today for an annual exam. She is currently 39w1dpregnant. Pregnancy doing well--followed by endocrinology for her thyroid disease. Her dermatosis of pregnancy is resolved. She feels overall well.   Her and her husband [place] adopting [place]   STI test results reviewed in Media Tab--all negative  Review of systems form notable for Mild LE edema. Feeling good fetal movement. No LOF or bleeding.   Updated history tabs and problem list .   OBJECTIVE:   BP 113/79   Pulse 93   Wt 171 lb 3.2 oz (77.7 kg)   LMP 11/21/2021   SpO2 100%   BMI 26.81 kg/m   HEENT: EOMI. Sclera without injection or icterus. MMM. External auditory canal examined and WNL. TM normal appearance, no erythema or bulging. Neck: Supple.  Cardiac: Regular rate and rhythm. Normal S1/S2. No murmurs, rubs, or gallops appreciated. Lungs: Clear bilaterally to ascultation.  Abdomen:Gravid abdomen Neuro: Normal speech   Psych: Pleasant and appropriate     ASSESSMENT/PLAN:   HDietitian- Tb test given occupation   Annual Examination  See AVS for age appropriate recommendations.  Will test for TB given occupation Otherwise, patient's health conditions are under excellent control  PHQ score 0, reviewed and discussed. Blood pressure reviewed and at goal .     Considered the following items based upon USPSTF recommendations: STI testing reviewed in media tab  Lipid panel (nonfasting or fasting) discussed based upon AHA recommendations and not ordered.  Consider repeat every 4-6 years.  Reviewed risk factors for latent tuberculosis and ordered  Discussed family history, BRCA testing not indicated. UTD on vaccines   Follow up in 1  year or sooner if indicated.    CMartyn Malay MD [place]

## 2022-10-07 DIAGNOSIS — Z3685 Encounter for antenatal screening for Streptococcus B: Secondary | ICD-10-CM | POA: Diagnosis not present

## 2022-10-08 ENCOUNTER — Encounter: Payer: Self-pay | Admitting: Family Medicine

## 2022-10-08 LAB — QUANTIFERON-TB GOLD PLUS
QuantiFERON Mitogen Value: 2.58 IU/mL
QuantiFERON Nil Value: 0.08 IU/mL
QuantiFERON TB1 Ag Value: 0.07 IU/mL
QuantiFERON TB2 Ag Value: 0.07 IU/mL
QuantiFERON-TB Gold Plus: NEGATIVE

## 2022-10-09 ENCOUNTER — Telehealth (HOSPITAL_COMMUNITY): Payer: Self-pay | Admitting: *Deleted

## 2022-10-09 ENCOUNTER — Encounter (HOSPITAL_COMMUNITY): Payer: Self-pay | Admitting: *Deleted

## 2022-10-09 NOTE — Telephone Encounter (Signed)
Preadmission screen  

## 2022-10-11 ENCOUNTER — Inpatient Hospital Stay (HOSPITAL_COMMUNITY)
Admission: AD | Admit: 2022-10-11 | Discharge: 2022-10-13 | DRG: 807 | Disposition: A | Discharge status: Home / Self Care | Payer: Medicaid Other | Attending: Obstetrics and Gynecology | Admitting: Obstetrics and Gynecology

## 2022-10-11 ENCOUNTER — Other Ambulatory Visit: Payer: Self-pay

## 2022-10-11 ENCOUNTER — Encounter (HOSPITAL_COMMUNITY): Payer: Self-pay | Admitting: Obstetrics and Gynecology

## 2022-10-11 DIAGNOSIS — O99284 Endocrine, nutritional and metabolic diseases complicating childbirth: Secondary | ICD-10-CM | POA: Diagnosis present

## 2022-10-11 DIAGNOSIS — Z23 Encounter for immunization: Secondary | ICD-10-CM | POA: Diagnosis not present

## 2022-10-11 DIAGNOSIS — O26893 Other specified pregnancy related conditions, third trimester: Secondary | ICD-10-CM | POA: Diagnosis not present

## 2022-10-11 DIAGNOSIS — Z3A39 39 weeks gestation of pregnancy: Secondary | ICD-10-CM | POA: Diagnosis not present

## 2022-10-11 DIAGNOSIS — E039 Hypothyroidism, unspecified: Secondary | ICD-10-CM | POA: Diagnosis not present

## 2022-10-11 LAB — TYPE AND SCREEN
ABO/RH(D): B POS
Antibody Screen: NEGATIVE

## 2022-10-11 LAB — CBC
HCT: 32.6 % — ABNORMAL LOW (ref 36.0–46.0)
Hemoglobin: 11.1 g/dL — ABNORMAL LOW (ref 12.0–15.0)
MCH: 29.6 pg (ref 26.0–34.0)
MCHC: 34 g/dL (ref 30.0–36.0)
MCV: 86.9 fL (ref 80.0–100.0)
Platelets: 170 10*3/uL (ref 150–400)
RBC: 3.75 MIL/uL — ABNORMAL LOW (ref 3.87–5.11)
RDW: 14.5 % (ref 11.5–15.5)
WBC: 6.9 10*3/uL (ref 4.0–10.5)
nRBC: 0 % (ref 0.0–0.2)

## 2022-10-11 MED ORDER — FENTANYL CITRATE (PF) 100 MCG/2ML IJ SOLN: 50.0000 ug | INTRAMUSCULAR | Status: DC | PRN

## 2022-10-11 MED ORDER — LACTATED RINGERS IV SOLN: 500.0000 mL | INTRAVENOUS | Status: DC | PRN

## 2022-10-11 MED ORDER — ONDANSETRON HCL 4 MG/2ML IJ SOLN
4.0000 mg | Freq: Four times a day (QID) | INTRAMUSCULAR | Status: DC | PRN
  Administered 2022-10-12: 4 mg via INTRAVENOUS
  Filled 2022-10-11: qty 2

## 2022-10-11 MED ORDER — LIDOCAINE HCL (PF) 1 % IJ SOLN: 30.0000 mL | INTRAMUSCULAR | Status: DC | PRN

## 2022-10-11 MED ORDER — OXYTOCIN BOLUS FROM INFUSION
333.0000 mL | Freq: Once | INTRAVENOUS | Status: AC
  Administered 2022-10-12: 333 mL via INTRAVENOUS

## 2022-10-11 MED ORDER — OXYCODONE-ACETAMINOPHEN 5-325 MG PO TABS: 1.0000 | ORAL_TABLET | ORAL | Status: DC | PRN

## 2022-10-11 MED ORDER — ACETAMINOPHEN 325 MG PO TABS: 650.0000 mg | ORAL_TABLET | ORAL | Status: DC | PRN

## 2022-10-11 MED ORDER — OXYCODONE-ACETAMINOPHEN 5-325 MG PO TABS
2.0000 | ORAL_TABLET | ORAL | Status: DC | PRN
  Administered 2022-10-12: 2 via ORAL
  Filled 2022-10-11: qty 2

## 2022-10-11 MED ORDER — SOD CITRATE-CITRIC ACID 500-334 MG/5ML PO SOLN: 30.0000 mL | ORAL | Status: DC | PRN

## 2022-10-11 MED ORDER — OXYTOCIN-SODIUM CHLORIDE 30-0.9 UT/500ML-% IV SOLN
2.5000 [IU]/h | INTRAVENOUS | Status: DC
  Filled 2022-10-11: qty 500

## 2022-10-11 MED ORDER — LACTATED RINGERS IV SOLN: INTRAVENOUS | Status: DC

## 2022-10-11 NOTE — Progress Notes (Signed)
Report called to L&D charge nurse - waiting for bed assignment.

## 2022-10-11 NOTE — MAU Note (Signed)
.  Olivia Shepard is a 31 y.o. at 31w6dhere in MAU reporting: ctx all day, but every 2-3 minutes since 1815. Reports bloody show, denies LOF. +FM   Onset of complaint: 1815 Pain score: 5 Vitals:   10/11/22 2123  BP: 116/81  Pulse: 95  Resp: 19  Temp: 98 F (36.7 C)  SpO2: 97%     FHT:161 Lab orders placed from triage:  labor eval

## 2022-10-11 NOTE — H&P (Signed)
Olivia Shepard is a 31 y.o. female [id number] at 62 6/7 weeks (EDD 10/19/22 by LMP c/w 10 week [place]) presenting for painful contractions and cervical change to 4-5cm. Prenatal care significant for hypothyroidism with endocrine following levels and adjusting medication.  [name]  OB History     Gravida  8   Para  4   Term  4   Preterm      AB  3   Living  4      SAB  3   IAB      Ectopic      Multiple  0   Live Births  4         03-04-2015 7lbs 8oz, Vaginal Delivery  09-16-2016, 39.4 wks M, 8lbs 10oz, Vaginal Delivery  04-06-2018, 39.1 wks F, 9lbs, NSVD  05-05-2020  8lbs 2oz, Vaginal Delivery  SAB x 3  Past Medical History:  Diagnosis Date   Anemia    Anxiety    GERD (gastroesophageal reflux disease)    History of postpartum hemorrhage, currently pregnant    Hypothyroidism    Past Surgical History:  Procedure Laterality Date   ESOPHAGEAL DILATION     Family History: family history includes Colon cancer in her paternal great-grandfather and paternal great-grandmother; Diabetes in her mother; Lung cancer in her father and maternal grandmother; Stomach cancer in her paternal great-grandfather; Varicose Veins in her father and paternal grandmother. Social History:  reports that she has never smoked. She has never used smokeless tobacco. She reports that she does not drink alcohol and does not use drugs.     Maternal Diabetes: No Genetic Screening: Normal Maternal Ultrasounds/Referrals: Normal Fetal Ultrasounds or other Referrals:  None Maternal Substance Abuse:  No Significant Maternal Medications:  None Significant Maternal Lab Results:  Group B Strep negative Number of Prenatal Visits:greater than 3 verified prenatal visits Other Comments:  None  Review of Systems  Gastrointestinal:  Positive for abdominal pain.  Genitourinary:  Negative for vaginal bleeding.  Neurological:  Negative for headaches.    Maternal Medical History:  Reason for admission: Contractions.   Contractions: Onset was 3-5 hours ago.   Frequency: regular.   Perceived severity is moderate.   Fetal activity: Perceived fetal activity is normal.   Prenatal complications: Hypothyroid, recurrent SAB Prenatal Complications - Diabetes: none.     Blood pressure 116/81, pulse 95, temperature 98 F (36.7 C), resp. rate 19, height '5\' 7"'$  (1.702 m), weight 79.7 kg, last menstrual period 11/21/2021, SpO2 97 %. Exam Physical Exam  Prenatal labs: ABO, Rh: B/Positive/-- (05/11 0000) Antibody: Negative (05/11 0000) Rubella: Nonimmune (05/11 0000) RPR: Nonreactive (05/11 0000)  HBsAg: Negative (05/11 0000)  HIV: Non-reactive (05/11 0000)  GBS:   Negative   Assessment/Plan: Pt admitted in labor and will follow progress.  Likely will go unmedicated.   Will augment as needed.  FHR category 1  Logan Bores 10/11/2022, 9:48 PM

## 2022-10-12 ENCOUNTER — Encounter (HOSPITAL_COMMUNITY): Payer: Self-pay | Admitting: Obstetrics and Gynecology

## 2022-10-12 DIAGNOSIS — Z3A39 39 weeks gestation of pregnancy: Secondary | ICD-10-CM | POA: Diagnosis not present

## 2022-10-12 LAB — CBC
HCT: 30.8 % — ABNORMAL LOW (ref 36.0–46.0)
Hemoglobin: 10.5 g/dL — ABNORMAL LOW (ref 12.0–15.0)
MCH: 29.5 pg (ref 26.0–34.0)
MCHC: 34.1 g/dL (ref 30.0–36.0)
MCV: 86.5 fL (ref 80.0–100.0)
Platelets: 153 10*3/uL (ref 150–400)
RBC: 3.56 MIL/uL — ABNORMAL LOW (ref 3.87–5.11)
RDW: 14.3 % (ref 11.5–15.5)
WBC: 11.2 10*3/uL — ABNORMAL HIGH (ref 4.0–10.5)
nRBC: 0 % (ref 0.0–0.2)

## 2022-10-12 LAB — RPR: RPR Ser Ql: NONREACTIVE

## 2022-10-12 MED ORDER — ONDANSETRON HCL 4 MG PO TABS: 4.0000 mg | ORAL_TABLET | ORAL | Status: DC | PRN

## 2022-10-12 MED ORDER — WITCH HAZEL-GLYCERIN EX PADS
1.0000 | MEDICATED_PAD | CUTANEOUS | Status: DC | PRN
  Administered 2022-10-13: 1 via TOPICAL

## 2022-10-12 MED ORDER — ACETAMINOPHEN 325 MG PO TABS
650.0000 mg | ORAL_TABLET | ORAL | Status: DC | PRN
  Administered 2022-10-12 – 2022-10-13 (×4): 650 mg via ORAL
  Filled 2022-10-12 (×4): qty 2

## 2022-10-12 MED ORDER — TRANEXAMIC ACID-NACL 1000-0.7 MG/100ML-% IV SOLN
INTRAVENOUS | Status: AC
  Administered 2022-10-12: 1000 mg
  Filled 2022-10-12: qty 100

## 2022-10-12 MED ORDER — ZOLPIDEM TARTRATE 5 MG PO TABS: 5.0000 mg | ORAL_TABLET | Freq: Every evening | ORAL | Status: DC | PRN

## 2022-10-12 MED ORDER — KETOROLAC TROMETHAMINE 30 MG/ML IJ SOLN
30.0000 mg | Freq: Once | INTRAMUSCULAR | Status: AC
  Administered 2022-10-12: 30 mg via INTRAVENOUS
  Filled 2022-10-12: qty 1

## 2022-10-12 MED ORDER — TETANUS-DIPHTH-ACELL PERTUSSIS 5-2.5-18.5 LF-MCG/0.5 IM SUSY: 0.5000 mL | PREFILLED_SYRINGE | Freq: Once | INTRAMUSCULAR | Status: DC

## 2022-10-12 MED ORDER — MEASLES, MUMPS & RUBELLA VAC IJ SOLR
0.5000 mL | Freq: Once | INTRAMUSCULAR | Status: AC
  Administered 2022-10-13: 0.5 mL via SUBCUTANEOUS
  Filled 2022-10-12: qty 0.5

## 2022-10-12 MED ORDER — SIMETHICONE 80 MG PO CHEW
80.0000 mg | CHEWABLE_TABLET | ORAL | Status: DC | PRN
  Administered 2022-10-12 (×2): 80 mg via ORAL
  Filled 2022-10-12 (×2): qty 1

## 2022-10-12 MED ORDER — SENNOSIDES-DOCUSATE SODIUM 8.6-50 MG PO TABS
2.0000 | ORAL_TABLET | ORAL | Status: DC
  Administered 2022-10-12 – 2022-10-13 (×2): 2 via ORAL
  Filled 2022-10-12 (×2): qty 2

## 2022-10-12 MED ORDER — LEVOTHYROXINE SODIUM 75 MCG PO TABS
75.0000 ug | ORAL_TABLET | Freq: Every day | ORAL | Status: DC
  Administered 2022-10-12 – 2022-10-13 (×2): 75 ug via ORAL
  Filled 2022-10-12 (×2): qty 1

## 2022-10-12 MED ORDER — OXYCODONE HCL 5 MG PO TABS
5.0000 mg | ORAL_TABLET | ORAL | Status: DC | PRN
  Administered 2022-10-12 (×3): 5 mg via ORAL
  Filled 2022-10-12 (×3): qty 1

## 2022-10-12 MED ORDER — IBUPROFEN 600 MG PO TABS
600.0000 mg | ORAL_TABLET | Freq: Four times a day (QID) | ORAL | Status: DC
  Administered 2022-10-12 – 2022-10-13 (×6): 600 mg via ORAL
  Filled 2022-10-12 (×6): qty 1

## 2022-10-12 MED ORDER — COCONUT OIL OIL
1.0000 | TOPICAL_OIL | Status: DC | PRN
  Administered 2022-10-12: 1 via TOPICAL

## 2022-10-12 MED ORDER — TRANEXAMIC ACID-NACL 1000-0.7 MG/100ML-% IV SOLN: 1000.0000 mg | INTRAVENOUS | Status: AC

## 2022-10-12 MED ORDER — DIBUCAINE (PERIANAL) 1 % EX OINT
1.0000 | TOPICAL_OINTMENT | CUTANEOUS | Status: DC | PRN
  Administered 2022-10-13: 1 via RECTAL
  Filled 2022-10-12: qty 28

## 2022-10-12 MED ORDER — BENZOCAINE-MENTHOL 20-0.5 % EX AERO: 1.0000 | INHALATION_SPRAY | CUTANEOUS | Status: DC | PRN

## 2022-10-12 MED ORDER — ONDANSETRON HCL 4 MG/2ML IJ SOLN
4.0000 mg | INTRAMUSCULAR | Status: DC | PRN
  Administered 2022-10-12: 4 mg via INTRAVENOUS
  Filled 2022-10-12: qty 2

## 2022-10-12 MED ORDER — DIPHENHYDRAMINE HCL 25 MG PO CAPS: 25.0000 mg | ORAL_CAPSULE | Freq: Four times a day (QID) | ORAL | Status: DC | PRN

## 2022-10-12 MED ORDER — PRENATAL MULTIVITAMIN CH
1.0000 | ORAL_TABLET | Freq: Every day | ORAL | Status: DC
  Administered 2022-10-13: 1 via ORAL
  Filled 2022-10-12: qty 1

## 2022-10-12 NOTE — Progress Notes (Signed)
Patient ID: Olivia Shepard, female   DOB: Sep 10, 1991, 31 y.o.   MRN: [id number] Pt coping well with contractions, requesting AROM  Afeb VSS FHR category1  Cervix 80/5-6/-1 AROM light/moderate meconium  Continue to follow progress

## 2022-10-12 NOTE — Lactation Note (Signed)
This note was copied from a baby's chart. Lactation Consultation Note  Patient Name: Olivia Shepard [id number] Date: 10/12/2022   Age:33 hours Attempted to see mom. RN stated mom isn't feeling well at this time. They are still working with mom. Maternal Data    Feeding    LATCH Score                    Lactation Tools Discussed/Used    Interventions    Discharge    Consult Status      Theodoro Kalata 10/12/2022, 2:38 AM

## 2022-10-12 NOTE — Lactation Note (Signed)
This note was copied from a baby's chart. Lactation Consultation Note  Patient Name: Olivia Shepard [id number] Date: 10/12/2022 Reason for consult: Initial assessment;Term Age:33 hours Experienced BF mom having no trouble BF. Baby is swaddled. Suggested mom un-swaddle baby and BF STS. Mom stated all of her children had tongue ties and had to be revised. Mom stated this baby isn't pinching or hurting her nipples. Encouraged mom to call for assistance or questions. Maternal Data Does the patient have breastfeeding experience prior to this delivery?: Yes How long did the patient breastfeed?: 12-18 months each  Feeding    LATCH Score Latch: Grasps breast easily, tongue down, lips flanged, rhythmical sucking.  Audible Swallowing: A few with stimulation  Type of Nipple: Everted at rest and after stimulation  Comfort (Breast/Nipple): Soft / non-tender  Hold (Positioning): No assistance needed to correctly position infant at breast.  LATCH Score: 9   Lactation Tools Discussed/Used    Interventions Interventions: Breast feeding basics reviewed;Skin to skin;LC Services brochure  Discharge    Consult Status Consult Status: PRN Date: 10/12/22 Follow-up type: In-patient    Theodoro Kalata 10/12/2022, 4:56 AM

## 2022-10-12 NOTE — Lactation Note (Signed)
This note was copied from a baby's chart. Lactation Consultation Note  Patient Name: Olivia Shepard [id number] Date: 10/12/2022 Reason for consult: Mother's request;Term Age:33 hours  LC into room per lactating parent request. LC offered assistance with latching. Infant is very fussy and seems inconsolable. Bowles assisted with hand expression and fingerfed. Unable to observe tongue extension passing gum line. Infant latches but no active sucking noted. Lactating parent has hand pump for colostrum expression. LP disclosed other children had tongue ties. Talked about nipple shields and DEBP. Discussed normal newborn behavior and patterns, signs of good milk transfer, hunger cues, tummy size and benefits of skin to skin.   Plan: 1-Breastfeeding on demand or 8-12 times in 24h period. 2-Use manual pump as needed 3-Encouraged lactating parent rest, hydration and food intake.   Contact LC as needed for feeds/support/concerns/questions. All questions answered at this time.    Maternal Data Has patient been taught Hand Expression?: Yes Does the patient have breastfeeding experience prior to this delivery?: Yes  Feeding Mother's Current Feeding Choice: Breast Milk  LATCH Score Latch: Repeated attempts needed to sustain latch, nipple held in mouth throughout feeding, stimulation needed to elicit sucking reflex.  Audible Swallowing: None  Type of Nipple: Everted at rest and after stimulation  Comfort (Breast/Nipple): Soft / non-tender  Hold (Positioning): Assistance needed to correctly position infant at breast and maintain latch.  LATCH Score: 6   Lactation Tools Discussed/Used Tools: Pump;Flanges Flange Size: 24 Breast pump type: Manual Reason for Pumping: stimulation and supplementation Pumping frequency: as needed  Interventions Interventions: Breast feeding basics reviewed;Assisted with latch;Skin to skin;Hand express;Breast massage;Hand pump;Expressed milk;Support pillows;Adjust  position;Education  Discharge Pump: Manual  Consult Status Consult Status: PRN Follow-up type: Call as needed    [place] 10/12/2022, 5:27 PM

## 2022-10-12 NOTE — Progress Notes (Signed)
Patient ID: Olivia Shepard, female   DOB: 1991/07/30, 31 y.o.   MRN: [id number] DOD Doing well in pp room, bleeding normal  Afeb VSS Fundus firm  Desires circumcision and reviewed with patient.   Will plan for tomorrow

## 2022-10-13 MED ORDER — IBUPROFEN 600 MG PO TABS: 600.0000 mg | ORAL_TABLET | Freq: Four times a day (QID) | ORAL | 0 refills | Status: DC

## 2022-10-13 NOTE — Progress Notes (Signed)
MOB was referred for history of anxiety. * Referral screened out by Clinical Social Worker because none of the following criteria appear to apply: ~ History of anxiety/depression during this pregnancy, or of post-partum depression following prior delivery. No concerns of anxiety noted in prenatal records.  ~ Diagnosis of anxiety and/or depression within last 3 years. Per chart review, MOB's anxiety dates back to 2018.  OR * MOB's symptoms currently being treated with medication and/or therapy. Please contact the Clinical Social Worker if needs arise, by [facility] request, or if MOB scores greater than 9/yes to question 10 on Edinburgh Postpartum Depression Screen.  Abundio Miu, [place] Worker [facility] Cell#: [phone]

## 2022-10-13 NOTE — Progress Notes (Signed)
Patient is doing well.  She is ambulating, voiding, tolerating PO.  Pain control is good.  Lochia is appropriate  Vitals:   10/12/22 1410 10/12/22 1740 10/12/22 2215 10/13/22 0614  BP: 101/85 1'09/64 94/60 93/60 '$  Pulse: 66 70 81 70  Resp: '16 16 18 16  '$ Temp: 98.6 F (37 C) 97.8 F (36.6 C) 98.4 F (36.9 C) 97.7 F (36.5 C)  TempSrc: Oral Oral Oral Oral  SpO2: 98% 97% 98% 97%  Weight:      Height:        NAD Fundus firm Ext: no edema  Lab Results  Component Value Date   WBC 11.2 (H) 10/12/2022   HGB 10.5 (L) 10/12/2022   HCT 30.8 (L) 10/12/2022   MCV 86.5 10/12/2022   PLT 153 10/12/2022    --/--/B POS (12/02 2201)  A/P 30 y.o. [id number] PPD#1 s/p TSVD. Routine care.   Desires discharge today   Desires circumcision.   Discussed r/b/a of the procedure.  Reviewed that circumcision is an elective surgical procedure and not considered medically necessary.  Reviewed the risks of the procedure including the risk of infection, bleeding, damage to surrounding structures, including scrotum, shaft, urethra and head of penis, and an undesired cosmetic effect requiring additional procedures for revision.  Consent signed.    [place]

## 2022-10-13 NOTE — Discharge Summary (Signed)
Postpartum Discharge Summary  Date of Service updated 10/13/22     Patient Name: Olivia Shepard DOB: [birth year] MRN: [id number]  Date of admission: 10/11/2022 Delivery date:10/12/2022  Delivering provider: Paula Compton  Date of discharge: 10/13/2022  Admitting diagnosis: Indication for care in labor and delivery, antepartum [O75.9] Precipitous delivery, delivered (current hospitalization) [O62.3] Intrauterine pregnancy: [redacted]w[redacted]d    Secondary diagnosis:  Principal Problem:   Indication for care in labor and delivery, antepartum Active Problems:   Precipitous delivery, delivered (current hospitalization)  Additional problems: None    Discharge diagnosis: Term Pregnancy Delivered                                              Post partum procedures: n/a Augmentation: AROM Complications: None  Hospital course: Onset of Labor With Vaginal Delivery      31y.o. yo [id number] [id number] admitted in Active Labor on 10/11/2022. Labor course was complicated by n/a  Membrane Rupture Time/Date: 12:13 AM ,10/12/2022   Delivery Method:Vaginal, Spontaneous  Episiotomy: None  Lacerations:  None  Patient had a postpartum course complicated by n/a.  She is ambulating, tolerating a regular diet, passing flatus, and urinating well. Patient is discharged home in stable condition on 10/13/22.  Newborn Data: Birth date:10/12/2022  Birth time:1:42 AM  Gender:Female  Living status:Living  Apgars:9 ,9  Weight:4180 g    Physical exam  Vitals:   10/12/22 1410 10/12/22 1740 10/12/22 2215 10/13/22 0614  BP: 101/85 1'09/64 94/60 93/60 '$  Pulse: 66 70 81 70  Resp: '16 16 18 16  '$ Temp: 98.6 F (37 C) 97.8 F (36.6 C) 98.4 F (36.9 C) 97.7 F (36.5 C)  TempSrc: Oral Oral Oral Oral  SpO2: 98% 97% 98% 97%  Weight:      Height:       General: alert, cooperative, and no distress Lochia: appropriate Uterine Fundus: firm Incision: N/A DVT Evaluation: No evidence of DVT seen on physical exam. Labs: Lab  Results  Component Value Date   WBC 11.2 (H) 10/12/2022   HGB 10.5 (L) 10/12/2022   HCT 30.8 (L) 10/12/2022   MCV 86.5 10/12/2022   PLT 153 10/12/2022      Latest Ref Rng & Units 08/02/2021    9:37 AM  CMP  Glucose 65 - 99 mg/dL 68   BUN 6 - 20 mg/dL 18   Creatinine 0.57 - 1.00 mg/dL 0.67   Sodium 134 - 144 mmol/L 143   Potassium 3.5 - 5.2 mmol/L 5.1   Chloride 96 - 106 mmol/L 103   CO2 20 - 29 mmol/L 26   Calcium 8.7 - 10.2 mg/dL 10.0   Total Protein 6.0 - 8.5 g/dL 7.1   Total Bilirubin 0.0 - 1.2 mg/dL 0.3   Alkaline Phos 44 - 121 IU/L 62   AST 0 - 40 IU/L 11   ALT 0 - 32 IU/L 12    Edinburgh Score:    10/12/2022    3:50 AM  Edinburgh Postnatal Depression Scale Screening Tool  I have been able to laugh and see the funny side of things. 0  I have looked forward with enjoyment to things. 0  I have blamed myself unnecessarily when things went wrong. 0  I have been anxious or worried for no good reason. 0  I have felt scared or panicky for no good reason.  0  Things have been getting on top of me. 0  I have been so unhappy that I have had difficulty sleeping. 0  I have felt sad or miserable. 0  I have been so unhappy that I have been crying. 0  The thought of harming myself has occurred to me. 0  Edinburgh Postnatal Depression Scale Total 0      After visit meds:  Allergies as of 10/13/2022   No Known Allergies      Medication List     STOP taking these medications    cyclobenzaprine 10 MG tablet Commonly known as: FLEXERIL   triamcinolone cream 0.1 % Commonly known as: KENALOG       TAKE these medications    ibuprofen 600 MG tablet Commonly known as: ADVIL Take 1 tablet (600 mg total) by mouth every 6 (six) hours.   levothyroxine 75 MCG tablet Commonly known as: SYNTHROID Take 1 tablet (75 mcg total) by mouth daily before breakfast.   lidocaine-prilocaine cream Commonly known as: EMLA Apply 1 application. topically as needed (for urethral  irritation).   pantoprazole 40 MG tablet Commonly known as: PROTONIX Take 40 mg by mouth daily.   prenatal multivitamin Tabs tablet Take 1 tablet by mouth daily.         Discharge home in stable condition Infant Feeding: Breast Infant Disposition:home with mother Discharge instruction: per After Visit Summary and Postpartum booklet. Activity: Advance as tolerated. Pelvic rest for 6 weeks.  Diet: routine diet Anticipated Birth Control: Unsure Postpartum Appointment:6 weeks Additional Postpartum F/U:  n/a Future Appointments: Future Appointments  Date Time Provider [place]  11/05/2022  2:30 PM Brita Romp, NP REA-REA None   Follow up Visit:  Follow-up Information     Paula Compton, MD Follow up in 6 week(s).   Specialty: Obstetrics and Gynecology Contact information: [address] [place] [address] [phone]                     10/13/2022 [facility] Lars Masson, MD

## 2022-10-15 ENCOUNTER — Inpatient Hospital Stay (HOSPITAL_COMMUNITY): Payer: Medicaid Other

## 2022-10-15 ENCOUNTER — Inpatient Hospital Stay (HOSPITAL_COMMUNITY): Admission: RE | Admit: 2022-10-15 | Payer: Medicaid Other | Source: Home / Self Care

## 2022-10-21 ENCOUNTER — Telehealth (HOSPITAL_COMMUNITY): Payer: Self-pay

## 2022-10-21 NOTE — H&P (Signed)
weeks (EDD 10/19/22 by LMP c/w 10 week [place]) presenting for painful contractions and cervical change to 4-5cm. Prenatal care significant for hypothyroidism with endocrine following levels and adjusting medication.  [name]   OB History       Gravida  8   Para  4   Term  4   Preterm      AB  3   Living  4        SAB  3   IAB      Ectopic      Multiple  0   Live Births  4           03-04-2015 7lbs 8oz, Vaginal Delivery   09-16-2016, 39.4 wks M, 8lbs 10oz, Vaginal Delivery   04-06-2018, 39.1 wks F, 9lbs, NSVD   05-05-2020  8lbs 2oz, Vaginal Delivery   SAB x 3       Past Medical History:  Diagnosis Date   Anemia     Anxiety     GERD (gastroesophageal reflux disease)     History of postpartum hemorrhage, currently pregnant     Hypothyroidism           Past Surgical History:  Procedure Laterality Date   ESOPHAGEAL DILATION        Family History: family history includes Colon cancer in her paternal great-grandfather and paternal great-grandmother; Diabetes in her mother; Lung cancer in her father and maternal grandmother; Stomach cancer in her paternal great-grandfather; Varicose Veins in her father and paternal grandmother. Social History:  reports that she has never smoked. She has never used smokeless tobacco. She reports that she does not drink alcohol and does not use drugs.        Maternal Diabetes: No Genetic Screening: Normal Maternal Ultrasounds/Referrals: Normal Fetal Ultrasounds or other Referrals:  None Maternal Substance Abuse:  No Significant Maternal Medications:  None Significant Maternal Lab Results:  Group B Strep negative Number of Prenatal Visits:greater than 3 verified prenatal visits Other Comments:  None   Review of Systems  Gastrointestinal:  Positive for abdominal pain.  Genitourinary:  Negative for vaginal bleeding.  Neurological:  Negative for headaches.    Maternal  Medical History:  Reason for admission: Contractions.    Contractions: Onset was 3-5 hours ago.   Frequency: regular.   Perceived severity is moderate.   Fetal activity: Perceived fetal activity is normal.   Prenatal complications: Hypothyroid, recurrent SAB Prenatal Complications - Diabetes: none.     Blood pressure 116/81, pulse 95, temperature 98 F (36.7 C), resp. rate 19, height '5\' 7"'$  (1.702 m), weight 79.7 kg, last menstrual period 11/21/2021, SpO2 97 %. Exam Regular, strong contractions FHR category 1  Physical Exam CV RRR Lungs CTA bilaterally Abdomen gravid, soft Cervix 80/4-5cm/-1  Prenatal labs: ABO, Rh: B/Positive/-- (05/11 0000) Antibody: Negative (05/11 0000) Rubella: Nonimmune (05/11 0000) RPR: Nonreactive (05/11 0000)  HBsAg: Negative (05/11 0000)  HIV: Non-reactive (05/11 0000)  GBS:   Negative     Assessment/Plan: Pt admitted in labor and will follow progress.  Likely will go unmedicated.   Will augment as needed.  FHR category 1   Olivia Shepard 10/11/2022, 9:48 PM

## 2022-10-21 NOTE — Telephone Encounter (Signed)
Patient reports feeling well. Patient declines questions/concerns about her health and healing.  Patient reports that baby is doing well. Patient has concerns about some yellow drainage from right eye. RN reviewed cleaning eye with a warm wet washcloth. RN told patient to reach out to her pediatrician about her concern. Eating, peeing/pooping, and gaining weight well. Baby sleeps in a bassinet. RN reviewed ABC's of safe sleep with patient. Patient declines any questions or concerns about baby.  EPDS score is 9.  Sharyn Lull [facility] 10/21/22,1539

## 2022-10-29 DIAGNOSIS — E039 Hypothyroidism, unspecified: Secondary | ICD-10-CM | POA: Diagnosis not present

## 2022-10-30 LAB — T4, FREE: Free T4: 1.55 ng/dL (ref 0.82–1.77)

## 2022-10-30 LAB — TSH: TSH: 0.758 u[IU]/mL (ref 0.450–4.500)

## 2022-11-04 ENCOUNTER — Telehealth: Payer: Medicaid Other | Admitting: Nurse Practitioner

## 2022-11-05 ENCOUNTER — Encounter: Payer: Self-pay | Admitting: Nurse Practitioner

## 2022-11-05 ENCOUNTER — Ambulatory Visit (INDEPENDENT_AMBULATORY_CARE_PROVIDER_SITE_OTHER): Payer: Medicaid Other | Admitting: Nurse Practitioner

## 2022-11-05 VITALS — Ht 67.0 in | Wt 160.0 lb

## 2022-11-05 DIAGNOSIS — E039 Hypothyroidism, unspecified: Secondary | ICD-10-CM

## 2022-11-05 NOTE — Progress Notes (Signed)
Endocrinology Follow Up Note                                         11/05/2022, 1:20 PM    TELEHEALTH VISIT: The patient is being engaged in telehealth visit.  This type of visit limits physical examination significantly, and thus is not preferable over face-to-face encounters.  I connected with  Olivia Shepard on 11/05/22 by a video enabled telemedicine application and verified that I am speaking with the correct person using two identifiers.   I discussed the limitations of evaluation and management by telemedicine. The patient expressed understanding and agreed to proceed.    The participants involved in this visit include: Brita Romp, NP located at [facility] and Azyah Flett  located at their personal residence listed.   Subjective:   Subjective    Olivia Shepard is a 31 y.o.-year-old female patient being seen in follow up after being seen in consultation for hypothyroidism referred by Martyn Malay, MD.   Past Medical History:  Diagnosis Date   Anemia    Anxiety    GERD (gastroesophageal reflux disease)    History of postpartum hemorrhage, currently pregnant    Hypothyroidism     Past Surgical History:  Procedure Laterality Date   ESOPHAGEAL DILATION      Social History   Socioeconomic History   Marital status: Married    Spouse name: Not on file   Number of children: Not on file   Years of education: Not on file   Highest education level: Not on file  Occupational History   Not on file  Tobacco Use   Smoking status: Never   Smokeless tobacco: Never  Vaping Use   Vaping Use: Never used  Substance and Sexual Activity   Alcohol use: No    Alcohol/week: 0.0 standard drinks of alcohol   Drug use: No   Sexual activity: Yes    Birth control/protection: None  Other Topics Concern   Not on file  Social History Narrative   Works as Marine scientist on L and D at [employer] 4 children, 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   Social Determinants of Health   Financial Resource Strain: Not on file  Food Insecurity: No Food Insecurity (10/11/2022)   Hunger Vital Sign    Worried About Running Out of Food in the Last Year: Never true    Ran Out of Food in the Last Year: Never true  Transportation Needs: No Transportation Needs (10/11/2022)   PRAPARE - Hydrologist (Medical): No    Lack of Transportation (Non-Medical): No  Physical Activity: Not on file  Stress: Not on file  Social Connections: Not on file    Family History  Problem Relation Age of Onset   Diabetes Mother    Varicose Veins Father    Lung cancer Father    Lung cancer Maternal Grandmother    Varicose Veins Paternal Grandmother    Colon  cancer Paternal Great-grandfather    Stomach cancer Paternal Great-grandfather    Colon cancer Paternal Great-grandmother    Esophageal cancer Neg Hx     Outpatient Encounter Medications as of 11/05/2022  Medication Sig   ibuprofen (ADVIL) 600 MG tablet Take 1 tablet (600 mg total) by mouth every 6 (six) hours.   levothyroxine (SYNTHROID) 75 MCG tablet Take 1 tablet (75 mcg total) by mouth daily before breakfast.   pantoprazole (PROTONIX) 40 MG tablet Take 40 mg by mouth daily.   Prenatal Vit-Fe Fumarate-FA (PRENATAL MULTIVITAMIN) TABS tablet Take 1 tablet by mouth daily.   lidocaine-prilocaine (EMLA) cream Apply 1 application. topically as needed (for urethral irritation). (Patient not taking: Reported on 11/05/2022)   No facility-administered encounter medications on file as of 11/05/2022.    ALLERGIES: No Known Allergies VACCINATION STATUS: Immunization History  Administered Date(s) Administered   DTaP 11/10/1989   Hepatitis B 08/06/2000, 01/28/2001, 10/27/2005   Hepatitis B, PED/ADOLESCENT 08/06/2000, 08/06/2000, 01/28/2001, 01/28/2001, 10/27/2005, 10/27/2005   Influenza Split 07/28/2012   Influenza, Seasonal, Injecte,  Preservative Fre 07/28/2012, 08/03/2013, 08/11/2015, 08/18/2017, 08/27/2018   Influenza,inj,Quad PF,6+ Mos 08/27/2018, 06/27/2019, 08/02/2021   Influenza,inj,Quad PF,6-35 Mos 06/27/2019   Influenza,inj,quad, With Preservative 07/28/2012, 08/03/2013, 08/11/2015, 08/18/2017, 08/27/2018   Influenza-Unspecified 07/28/2012, 08/03/2013, 08/11/2015, 08/18/2017, 06/27/2019   MMR 02/17/1991, 02/09/1995, 09/18/2016, 10/13/2022   Meningococcal Conjugate 06/27/2008   Meningococcal Polysaccharide 06/27/2008   PFIZER Comirnaty(Gray Top)Covid-19 Tri-Sucrose Vaccine 12/25/2020   PFIZER(Purple Top)SARS-COV-2 Vaccination 07/11/2020   PPD Test 06/14/2008, 02/03/2011, 02/13/2012   Td 04/25/2005   Tdap 02/09/1995, 08/10/2014, 08/29/2016, 04/30/2020     HPI   Olivia Shepard  is a patient with the above medical history. she was diagnosed with hypothyroidism a few years ago as she and her husband were trying to conceive a child.  She was started on Levothyroxine 25 mcg po daily as a result.  She has been tested for thyroid antibodies several times, negative.  She recently delivered a healthy baby.  I reviewed patient's thyroid tests:  Lab Results  Component Value Date   TSH 0.758 10/29/2022   TSH 1.700 09/04/2022   TSH 1.320 07/18/2022   TSH 2.380 05/27/2022   TSH 2.200 04/14/2022   TSH 2.47 03/20/2022   TSH 2.29 03/03/2022   TSH 2.480 08/02/2021   TSH 1.810 07/10/2020   TSH 2.360 08/27/2018   FREET4 1.55 10/29/2022   FREET4 1.11 09/04/2022   FREET4 1.19 07/18/2022   FREET4 1.27 05/27/2022   FREET4 1.18 04/14/2022   FREET4 0.98 08/31/2015     Pt denies feeling nodules in neck, hoarseness, dysphagia/odynophagia, SOB with lying down.  she does have family history of thyroid disorders in her mom (recently diagnosed).  No family history of thyroid cancer. No history of radiation therapy to head or neck.  No recent use of iodine supplements.  Denies use of Biotin containing supplements.  I reviewed her  chart and she also has a history of GERD (had to have esophagus stretched), anemia, anxiety.   Review of systems  Constitutional: + decreasing body weight (recently delivered her baby),  current Body mass index is 25.06 kg/m. , no fatigue, no subjective hyperthermia, no subjective hypothermia Eyes: no blurry vision, no xerophthalmia ENT: no sore throat, no nodules palpated in throat, no dysphagia/odynophagia, no hoarseness Cardiovascular: no chest pain, no shortness of breath, no palpitations, no leg swelling Respiratory: no cough, no shortness of breath Gastrointestinal: no nausea/vomiting/diarrhea Musculoskeletal: no muscle/joint aches Skin: no rashes, no hyperemia Neurological: no tremors, no numbness,  no tingling, no dizziness Psychiatric: no depression, no anxiety   Objective:   Objective     Ht _0  (1.702 m)   Wt 160 lb (72.6 kg) Comment: Per Patient  LMP 11/21/2021   BMI 25.06 kg/m  Wt Readings from Last 3 Encounters:  11/05/22 160 lb (72.6 kg)  10/11/22 175 lb 9.6 oz (79.7 kg)  10/06/22 171 lb 3.2 oz (77.7 kg)    BP Readings from Last 3 Encounters:  10/13/22 93/60  10/06/22 113/79  09/09/22 128/76     Physical Exam- Telehealth- significantly limited due to nature of visit  Constitutional: Body mass index is 25.06 kg/m. , not in acute distress, normal state of mind Respiratory: Adequate breathing efforts   CMP ( most recent) CMP     Component Value Date/Time   NA 143 08/02/2021 0937   K 5.1 08/02/2021 0937   CL 103 08/02/2021 0937   CO2 26 08/02/2021 0937   GLUCOSE 68 08/02/2021 0937   GLUCOSE 71 12/27/2015 1501   BUN 18 08/02/2021 0937   CREATININE 0.67 08/02/2021 0937   CREATININE 0.55 12/27/2015 1501   CALCIUM 10.0 08/02/2021 0937   PROT 7.1 08/02/2021 0937   ALBUMIN 4.8 08/02/2021 0937   AST 11 08/02/2021 0937   ALT 12 08/02/2021 0937   ALKPHOS 62 08/02/2021 0937   BILITOT 0.3 08/02/2021 0937   GFRNONAA 109 08/27/2018 1021   GFRNONAA  >89 12/27/2015 1501   GFRAA 125 08/27/2018 1021   GFRAA >89 12/27/2015 1501     Diabetic Labs (most recent): No results found for: "HGBA1C", "MICROALBUR"   Lipid Panel ( most recent) Lipid Panel     Component Value Date/Time   CHOL 144 12/27/2015 1501   TRIG 32 12/27/2015 1501   HDL 83 12/27/2015 1501   CHOLHDL 1.7 12/27/2015 1501   VLDL 6 12/27/2015 1501   LDLCALC 55 12/27/2015 1501       Lab Results  Component Value Date   TSH 0.758 10/29/2022   TSH 1.700 09/04/2022   TSH 1.320 07/18/2022   TSH 2.380 05/27/2022   TSH 2.200 04/14/2022   TSH 2.47 03/20/2022   TSH 2.29 03/03/2022   TSH 2.480 08/02/2021   TSH 1.810 07/10/2020   TSH 2.360 08/27/2018   FREET4 1.55 10/29/2022   FREET4 1.11 09/04/2022   FREET4 1.19 07/18/2022   FREET4 1.27 05/27/2022   FREET4 1.18 04/14/2022   FREET4 0.98 08/31/2015     Latest Reference Range & Units 03/20/22 00:00 04/14/22 15:04 05/27/22 14:33 07/18/22 16:05 09/04/22 14:15 10/29/22 11:06  TSH 0.450 - 4.500 uIU/mL 2.47 (E) 2.200 2.380 1.320 1.700 0.758  Triiodothyronine,Free,Serum 2.0 - 4.4 pg/mL  2.5      T4,Free(Direct) 0.82 - 1.77 ng/dL  1.18 1.27 1.19 1.11 1.55  Thyroperoxidase Ab SerPl-aCnc 0 - 34 IU/mL  <9      Thyroglobulin Antibody 0.0 - 0.9 IU/mL  <1.0      (E): External lab result  Assessment & Plan:   ASSESSMENT / PLAN:  1. Hypothyroidism-acquired  Patient with long-standing hypothyroidism, on levothyroxine therapy. On physical exam, patient does not have gross goiter, thyroid nodules, or neck compression symptoms.  Her antibody testing was negative, ruling out autoimmune thyroid dysfunction.    Her previsit thyroid function tests are consistent with appropriate hormone replacement.  She is advised to continue Levothyroxine 75 mcg po daily before breakfast.  I suspect that her dose may need to decrease on subsequent visits now that weight is returning to normal  after delivery.  She is advised to reach out if she feels  different (increase in anxiety, palpitations, tremors, unexplained weight loss, insomnia, etc) so that labs can be checked sooner if needed.  - We discussed about correct intake of levothyroxine, at fasting, with water, separated by at least 30 minutes from breakfast, and separated by more than 4 hours from calcium, iron, multivitamins, acid reflux medications (PPIs). -Patient is made aware of the fact that thyroid hormone replacement is needed for life, dose to be adjusted by periodic monitoring of thyroid function tests.   -Due to absence of clinical goiter, no need for thyroid ultrasound at this time.        I spent 20 minutes dedicated to the care of this patient on the date of this encounter to include pre-visit review of records, face-to-face time with the patient, and post visit ordering of testing.  Olivia Shepard  participated in the discussions, expressed understanding, and voiced agreement with the above plans.  All questions were answered to her satisfaction. she is encouraged to contact clinic should she have any questions or concerns prior to her return visit.   FOLLOW UP PLAN:  Return in about 2 months (around 01/06/2023) for Thyroid follow up, Previsit labs.  Rayetta Pigg, [facility] [address] [place], [place] [address] Phone: [phone] Fax: [phone]  11/05/2022, 1:20 PM

## 2022-11-05 NOTE — Patient Instructions (Signed)

## 2022-11-25 ENCOUNTER — Encounter: Payer: Self-pay | Admitting: Family Medicine

## 2022-11-25 DIAGNOSIS — K649 Unspecified hemorrhoids: Secondary | ICD-10-CM | POA: Diagnosis not present

## 2022-11-25 DIAGNOSIS — Z1331 Encounter for screening for depression: Secondary | ICD-10-CM | POA: Diagnosis not present

## 2022-11-25 DIAGNOSIS — R102 Pelvic and perineal pain: Secondary | ICD-10-CM | POA: Diagnosis not present

## 2022-11-25 DIAGNOSIS — Z3009 Encounter for other general counseling and advice on contraception: Secondary | ICD-10-CM | POA: Diagnosis not present

## 2022-11-27 ENCOUNTER — Ambulatory Visit: Payer: Medicaid Other | Admitting: Physical Therapy

## 2022-11-27 NOTE — Therapy (Signed)
OUTPATIENT PHYSICAL THERAPY FEMALE PELVIC EVALUATION   Patient Name: Olivia Shepard MRN: [id number] DOB:1991-05-11, 32 y.o., female Today's Date: 11/28/2022  END OF SESSION:  PT End of Session - 11/28/22 1010     Visit Number 1    Date for PT Re-Evaluation 02/20/23    Authorization Type [employer]    PT Start Time 0930    PT Stop Time 1010    PT Time Calculation (min) 40 min    Activity Tolerance Patient tolerated treatment well    Behavior During Therapy WFL for tasks assessed/performed             Past Medical History:  Diagnosis Date   Anemia    Anxiety    GERD (gastroesophageal reflux disease)    History of postpartum hemorrhage, currently pregnant    Hypothyroidism    Past Surgical History:  Procedure Laterality Date   ESOPHAGEAL DILATION     Patient Active Problem List   Diagnosis Date Noted   Precipitous delivery, delivered (current hospitalization) 10/12/2022   Indication for care in labor and delivery, antepartum 10/11/2022   Rash 07/31/2022   Other intervertebral disc degeneration, lumbar region 02/09/2022   Fatigue 08/02/2021   Dysphagia 11/14/2020   Pelvic floor dysfunction 10/02/2020   Varicose veins of legs, antepartum 12/19/2019   Plantar wart, right foot 03/18/2018   GERD (gastroesophageal reflux disease) 07/25/2016   Tinea pedis of both feet 07/25/2016   Hypothyroidism 07/19/2015   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   Chronic UTI 10/24/2013   IBS (irritable bowel syndrome) 06/29/2012    PCP: Martyn Malay, MD  REFERRING PROVIDER: Tona Sensing, FNP   REFERRING DIAG: R10.2 (ICD-10-CM) - Pelvic and perineal pain   THERAPY DIAG:  Muscle weakness (generalized)  Pelvic pain  Rationale for Evaluation and Treatment: Rehabilitation  ONSET DATE: 2/23  SUBJECTIVE:                                                                                                                                                                                            SUBJECTIVE STATEMENT: Vaginal delivery on 10/12/22. PT for vaginal pain. Patient had the burning pain for 10 months and had therapy prior to giving birth. I have diastasis. No numbness or tingling down left leg.   PAIN:  Are you having pain? Yes NPRS scale: 1/10 during the day, 5/10 night Pain location:  left side of pelvis, left SI joint  Pain type: aching SI joint and burning pelvis Pain description: constant   Aggravating factors: carrying things, being up walking around alot Relieving factors: warm bath, pelvic massage  PRECAUTIONS: None  WEIGHT BEARING RESTRICTIONS: No  FALLS:  Has patient fallen in last 6 months? No  LIVING ENVIRONMENT: Lives with: lives with their family  OCCUPATION: nurse  PLOF: Independent  PATIENT GOALS: reduce pain and balance muscles and reduce diastasis  PERTINENT HISTORY:  hypothyroidism  BOWEL MOVEMENT: Pain with bowel movement: Yes, with hemorrhoids Type of bowel movement:Type (Bristol Stool Scale) type 4 and Strain No Fully empty rectum: Yes:    URINATION: Pain with urination: No, feels like she has a bladder infection Fully empty bladder: Yes:   Stream: Strong Urgency: No Frequency: average  Leakage: Coughing and Sneezing Pads: No  INTERCOURSE:  Pain with intercourse: During Penetration uncomfortable Ability to have vaginal penetration:  Yes:   Marinoff Scale: 1/3  PREGNANCY: Vaginal deliveries 5 Tearing No  PROLAPSE: Initial had some but now not   OBJECTIVE:   PATIENT SURVEYS:  POPIQ-7 19  COGNITION: Overall cognitive status: Within functional limits for tasks assessed     SENSATION: Light touch: Appears intact Proprioception: Appears intact   LUMBAR SPECIAL TESTS:  SI Compression/distraction test: Negative left     POSTURE: No Significant postural limitations  PELVIC ALIGNMENT:  LUMBARAROM/PROM:  A/PROM A/PROM  eval  Flexion Full but increased muscle mass on the left   Extension Decreased by 25% from L1-L5, pressure in left hip   Right lateral flexion Decreased by 25%  Left lateral flexion full  Right rotation Decreased by 25%  Left rotation Decreased by 25%   (Blank rows = not tested)  LOWER EXTREMITY ROM: ER decreased by 25% bil.   LOWER EXTREMITY MMT:  MMT Right eval Left eval  Hip extension 4/5 4/5  Hip abduction 4/5 4/5  Hip adduction 4/5 4/5   PALPATION:   General  weak abdominal contraction                External Perineal Exam left ischiocavernosus, left urogenital diaphragm, good coloring                             Internal Pelvic Floor left ischiocavernosus, left iliococcygeus, left ischiocavernosus, restrictions on the left side of the bladder and urethra Patient confirms identification and approves PT to assess internal pelvic floor and treatment Yes  PELVIC MMT:   MMT eval  Vaginal 2/5 hold 5 sec  Diastasis Recti 1 finger width  (Blank rows = not tested)        TONE: good  PROLAPSE: none  TODAY'S TREATMENT:                                                                                                                              DATE: 11/28/22  EVAL eval finished   PATIENT EDUCATION:  Education details: educated patient on what to expect for the next visit Person educated: Patient Education method: Explanation Education comprehension: verbalized understanding  HOME EXERCISE PROGRAM: See above  ASSESSMENT:  CLINICAL IMPRESSION: Patient  is a 32 y.o. female who was seen today for physical therapy evaluation and treatment for pelvic and perineal pain. Patient has had this pain for 10 months. She reports her pain level is 5/10 in the left SI joint and burning pain in the left pelvic area. She feels like she has a constant UTI. Patient pain is worse during sleep and wakes her up frequently, carrying items or her kids, house work and doing many upright activities. She had a vaginal birth on 10/12/2022. Patient has  some discomfort with initial penile penetration vaginally. She will leak urine with sneezing and coughing. She has decreased ROM of lumbar spine. The left SI joint has decreased mobility and rotated posteriorly. She has some weakness in the core and hips. Her diastasis recti is 1 finger width with good tension. Pelvic floor strength is 2/5. She has tenderness located on the left ischiocavernosus, iliococcygeus. She has restrictions in the left urogenital diaphragm, along the left side of the bladder and urethra. Patient will benefit from skilled therapy to improve tissue mobility and strength to reduce pain to make everyday activities easier.   OBJECTIVE IMPAIRMENTS: decreased activity tolerance, decreased coordination, decreased endurance, decreased mobility, decreased ROM, decreased strength, increased fascial restrictions, and pain.   ACTIVITY LIMITATIONS: carrying, lifting, bending, standing, squatting, continence, and caring for others  PARTICIPATION LIMITATIONS: meal prep, cleaning, laundry, shopping, and community activity  PERSONAL FACTORS: Fitness and 1-2 comorbidities: hypothyroidism, recent vaginal birth on 10/12/22  are also affecting patient's functional outcome.   REHAB POTENTIAL: Excellent  CLINICAL DECISION MAKING: Evolving/moderate complexity  EVALUATION COMPLEXITY: Low   GOALS: Goals reviewed with patient? Yes  SHORT TERM GOALS: Target date: 12/26/22  Patient independent with initial HEP for SI mobility.  Baseline:not educated yet Goal status: INITIAL  2.  Patient is able to bulge fully and contract in a circle with the pelvic floor Baseline: unable to contract and hug the therapist finger Goal status: INITIAL  3.  Patient is able to fully contract the upper and lower abdominals to work on stability.  Baseline: Not able to  Goal status: INITIAL   LONG TERM GOALS: Target date: 02/20/2023  Patient independent with advanced HEP for core, pelvic floor and SI stability.   Baseline: not educated yet Goal status: INITIAL  2.  Patient is able to sit for 45 minutes with pain decreased >/= 1-2/10.  Baseline: 5/10 Goal status: INITIAL  3.  Patient able to carry her infant in the carrier with pain level >/= 1-2/10 due to decreased trigger points in the pelvic floor. Baseline: pain level 5/10 Goal status: INITIAL  4.  Patient constant discomfort of feeling she has a UTI has improved >/= 80% due to release of the pelvic floor.  Baseline: Constantly feels she has a UTI  Goal status: INITIAL  5. Patient POPIQ-7 is </= 8 compared  POPIQ-7 19 due to decreased pain and improved SI mobility.  Baseline:  Goal status: INITIAL   PLAN:  PT FREQUENCY: 1x/week  PT DURATION: 12 weeks  PLANNED INTERVENTIONS: Therapeutic exercises, Therapeutic activity, Neuromuscular re-education, Patient/Family education, Joint mobilization, Dry Needling, Spinal mobilization, Taping, Biofeedback, and Manual therapy  PLAN FOR NEXT SESSION: manual work on the left side of pelvic floor and along the bladder and urethra.   Earlie Counts, PT 11/28/22 5:30 PM

## 2022-11-27 NOTE — Therapy (Deleted)
OUTPATIENT PHYSICAL THERAPY FEMALE PELVIC EVALUATION   Patient Name: Olivia Shepard MRN: [id number] DOB:09-24-1991, 32 y.o., female Today's Date: 11/27/2022  END OF SESSION:   Past Medical History:  Diagnosis Date   Anemia    Anxiety    GERD (gastroesophageal reflux disease)    History of postpartum hemorrhage, currently pregnant    Hypothyroidism    Past Surgical History:  Procedure Laterality Date   ESOPHAGEAL DILATION     Patient Active Problem List   Diagnosis Date Noted   Precipitous delivery, delivered (current hospitalization) 10/12/2022   Indication for care in labor and delivery, antepartum 10/11/2022   Rash 07/31/2022   Other intervertebral disc degeneration, lumbar region 02/09/2022   Fatigue 08/02/2021   Dysphagia 11/14/2020   Pelvic floor dysfunction 10/02/2020   Varicose veins of legs, antepartum 12/19/2019   Plantar wart, right foot 03/18/2018   GERD (gastroesophageal reflux disease) 07/25/2016   Tinea pedis of both feet 07/25/2016   Hypothyroidism 07/19/2015   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   Chronic UTI 10/24/2013   IBS (irritable bowel syndrome) 06/29/2012    PCP: Martyn Malay, MD  REFERRING PROVIDER: Tona Sensing, FNP   REFERRING DIAG: R10.2 (ICD-10-CM) - Pelvic and perineal pain   THERAPY DIAG:  No diagnosis found.  Rationale for Evaluation and Treatment: Rehabilitation  ONSET DATE: ***  SUBJECTIVE:                                                                                                                                                                                           SUBJECTIVE STATEMENT: *** Fluid intake: {Yes/No:304960894}   PAIN:  Are you having pain? {yes/no:20286} NPRS scale: ***/10 Pain location: {pelvic pain location:27098}  Pain type: {type:313116} Pain description: {PAIN DESCRIPTION:21022940}   Aggravating factors: *** Relieving factors: ***  PRECAUTIONS: {Therapy  precautions:24002}  WEIGHT BEARING RESTRICTIONS: {Yes ***/No:24003}  FALLS:  Has patient fallen in last 6 months? {fallsyesno:27318}  LIVING ENVIRONMENT: Lives with: {[facility] lives with:25569::"lives with their family"} Lives in: {Lives in:25570} Stairs: {opstairs:27293} Has following equipment at home: {Assistive devices:23999}  OCCUPATION: ***  PLOF: {PLOF:24004}  PATIENT GOALS: ***  PERTINENT HISTORY:  *** Sexual abuse: {Yes/No:304960894}  BOWEL MOVEMENT: Pain with bowel movement: {yes/no:20286} Type of bowel movement:{PT BM type:27100} Fully empty rectum: {Yes/No:304960894} Leakage: {Yes/No:304960894} Pads: {Yes/No:304960894} Fiber supplement: {Yes/No:304960894}  URINATION: Pain with urination: {yes/no:20286} Fully empty bladder: {Yes/No:304960894} Stream: {PT urination:27102} Urgency: {Yes/No:304960894} Frequency: *** Leakage: {PT leakage:27103} Pads: {Yes/No:304960894}  INTERCOURSE: Pain with intercourse: {pain with intercourse PA:27099} Ability to have vaginal penetration:  {Yes/No:304960894} Climax: *** Marinoff Scale: ***/3  PREGNANCY: Vaginal deliveries *** Tearing {Yes***/No:304960894} C-section deliveries ***  Currently pregnant {Yes***/No:304960894}  PROLAPSE: {PT prolapse:27101}   OBJECTIVE:   DIAGNOSTIC FINDINGS:  ***  PATIENT SURVEYS:  {rehab surveys:24030}  PFIQ-7 ***  COGNITION: Overall cognitive status: {cognition:24006}     SENSATION: Light touch: {intact/deficits:24005} Proprioception: {intact/deficits:24005}  MUSCLE LENGTH: Hamstrings: Right *** deg; Left *** deg Thomas test: Right *** deg; Left *** deg  LUMBAR SPECIAL TESTS:  {lumbar special test:25242}  FUNCTIONAL TESTS:  {Functional tests:24029}  GAIT: Distance walked: *** Assistive device utilized: {Assistive devices:23999} Level of assistance: {Levels of assistance:24026} Comments: ***  POSTURE: {posture:25561}  PELVIC  ALIGNMENT:  LUMBARAROM/PROM:  A/PROM A/PROM  eval  Flexion   Extension   Right lateral flexion   Left lateral flexion   Right rotation   Left rotation    (Blank rows = not tested)  LOWER EXTREMITY ROM:  {AROM/PROM:27142} ROM Right eval Left eval  Hip flexion    Hip extension    Hip abduction    Hip adduction    Hip internal rotation    Hip external rotation    Knee flexion    Knee extension    Ankle dorsiflexion    Ankle plantarflexion    Ankle inversion    Ankle eversion     (Blank rows = not tested)  LOWER EXTREMITY MMT:  MMT Right eval Left eval  Hip flexion    Hip extension    Hip abduction    Hip adduction    Hip internal rotation    Hip external rotation    Knee flexion    Knee extension    Ankle dorsiflexion    Ankle plantarflexion    Ankle inversion    Ankle eversion     PALPATION:   General  ***                External Perineal Exam ***                             Internal Pelvic Floor ***  Patient confirms identification and approves PT to assess internal pelvic floor and treatment {yes/no:20286}  PELVIC MMT:   MMT eval  Vaginal   Internal Anal Sphincter   External Anal Sphincter   Puborectalis   Diastasis Recti   (Blank rows = not tested)        TONE: ***  PROLAPSE: ***  TODAY'S TREATMENT:                                                                                                                              DATE: ***  EVAL ***   PATIENT EDUCATION:  Education details: *** Person educated: {Person educated:25204} Education method: {Education Method:25205} Education comprehension: {Education Comprehension:25206}  HOME EXERCISE PROGRAM: ***  ASSESSMENT:  CLINICAL IMPRESSION: Patient is a *** y.o. *** who was seen today for physical therapy evaluation and treatment for ***.   OBJECTIVE IMPAIRMENTS: {opptimpairments:25111}.   ACTIVITY LIMITATIONS: {activitylimitations:27494}  PARTICIPATION  LIMITATIONS:  {participationrestrictions:25113}  PERSONAL FACTORS: {Personal factors:25162} are also affecting patient's functional outcome.   REHAB POTENTIAL: {rehabpotential:25112}  CLINICAL DECISION MAKING: {clinical decision making:25114}  EVALUATION COMPLEXITY: {Evaluation complexity:25115}   GOALS: Goals reviewed with patient? {yes/no:20286}  SHORT TERM GOALS: Target date: ***  *** Baseline: Goal status: {GOALSTATUS:25110}  2.  *** Baseline:  Goal status: {GOALSTATUS:25110}  3.  *** Baseline:  Goal status: {GOALSTATUS:25110}  4.  *** Baseline:  Goal status: {GOALSTATUS:25110}  5.  *** Baseline:  Goal status: {GOALSTATUS:25110}  6.  *** Baseline:  Goal status: {GOALSTATUS:25110}  LONG TERM GOALS: Target date: ***  *** Baseline:  Goal status: {GOALSTATUS:25110}  2.  *** Baseline:  Goal status: {GOALSTATUS:25110}  3.  *** Baseline:  Goal status: {GOALSTATUS:25110}  4.  *** Baseline:  Goal status: {GOALSTATUS:25110}  5.  *** Baseline:  Goal status: {GOALSTATUS:25110}  6.  *** Baseline:  Goal status: {GOALSTATUS:25110}  PLAN:  PT FREQUENCY: {rehab frequency:25116}  PT DURATION: {rehab duration:25117}  PLANNED INTERVENTIONS: {rehab planned interventions:25118::"Therapeutic exercises","Therapeutic activity","Neuromuscular re-education","Balance training","Gait training","Patient/Family education","Self Care","Joint mobilization"}  PLAN FOR NEXT SESSION: ***   Camillo Flaming Smt [place], PT 11/27/2022, 1:48 PM

## 2022-11-28 ENCOUNTER — Ambulatory Visit: Payer: Medicaid Other | Attending: Obstetrics and Gynecology | Admitting: Physical Therapy

## 2022-11-28 ENCOUNTER — Encounter: Payer: Self-pay | Admitting: Physical Therapy

## 2022-11-28 ENCOUNTER — Other Ambulatory Visit: Payer: Self-pay

## 2022-11-28 DIAGNOSIS — M62838 Other muscle spasm: Secondary | ICD-10-CM | POA: Diagnosis not present

## 2022-11-28 DIAGNOSIS — M6208 Separation of muscle (nontraumatic), other site: Secondary | ICD-10-CM | POA: Insufficient documentation

## 2022-11-28 DIAGNOSIS — E039 Hypothyroidism, unspecified: Secondary | ICD-10-CM | POA: Diagnosis not present

## 2022-11-28 DIAGNOSIS — R102 Pelvic and perineal pain: Secondary | ICD-10-CM | POA: Diagnosis not present

## 2022-11-28 DIAGNOSIS — M6281 Muscle weakness (generalized): Secondary | ICD-10-CM | POA: Diagnosis not present

## 2022-12-04 ENCOUNTER — Encounter: Payer: Medicaid Other | Admitting: Physical Therapy

## 2022-12-05 ENCOUNTER — Ambulatory Visit: Payer: Medicaid Other | Admitting: Physical Therapy

## 2022-12-05 ENCOUNTER — Encounter: Payer: Self-pay | Admitting: Physical Therapy

## 2022-12-05 DIAGNOSIS — M62838 Other muscle spasm: Secondary | ICD-10-CM | POA: Diagnosis not present

## 2022-12-05 DIAGNOSIS — R102 Pelvic and perineal pain: Secondary | ICD-10-CM

## 2022-12-05 DIAGNOSIS — M6281 Muscle weakness (generalized): Secondary | ICD-10-CM | POA: Diagnosis not present

## 2022-12-05 DIAGNOSIS — M6208 Separation of muscle (nontraumatic), other site: Secondary | ICD-10-CM | POA: Diagnosis not present

## 2022-12-05 DIAGNOSIS — E039 Hypothyroidism, unspecified: Secondary | ICD-10-CM | POA: Diagnosis not present

## 2022-12-05 NOTE — Patient Instructions (Signed)

## 2022-12-05 NOTE — Therapy (Signed)
OUTPATIENT PHYSICAL THERAPY TREATMENT NOTE   Patient Name: Olivia Shepard MRN: [id number] DOB:12/17/1990, 32 y.o., female Today's Date: 12/05/2022  PCP: Martyn Malay, MD  REFERRING PROVIDER: Tona Sensing, FNP    END OF SESSION:   PT End of Session - 12/05/22 0806     Visit Number 2    Date for PT Re-Evaluation 02/20/23    Authorization Type [employer]    Authorization Time Period 1/19-3/18    Authorization - Visit Number 1    Authorization - Number of Visits 6    PT Start Time 0800    PT Stop Time 0840    PT Time Calculation (min) 40 min    Activity Tolerance Patient tolerated treatment well    Behavior During Therapy WFL for tasks assessed/performed             Past Medical History:  Diagnosis Date   Anemia    Anxiety    GERD (gastroesophageal reflux disease)    History of postpartum hemorrhage, currently pregnant    Hypothyroidism    Past Surgical History:  Procedure Laterality Date   ESOPHAGEAL DILATION     Patient Active Problem List   Diagnosis Date Noted   Precipitous delivery, delivered (current hospitalization) 10/12/2022   Indication for care in labor and delivery, antepartum 10/11/2022   Rash 07/31/2022   Other intervertebral disc degeneration, lumbar region 02/09/2022   Fatigue 08/02/2021   Dysphagia 11/14/2020   Pelvic floor dysfunction 10/02/2020   Varicose veins of legs, antepartum 12/19/2019   Plantar wart, right foot 03/18/2018   GERD (gastroesophageal reflux disease) 07/25/2016   Tinea pedis of both feet 07/25/2016   Hypothyroidism 07/19/2015   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   Chronic UTI 10/24/2013   IBS (irritable bowel syndrome) 06/29/2012   REFERRING DIAG: R10.2 (ICD-10-CM) - Pelvic and perineal pain    THERAPY DIAG:  Muscle weakness (generalized)   Pelvic pain   Rationale for Evaluation and Treatment: Rehabilitation   ONSET DATE: 2/23   SUBJECTIVE:                                                                                                                                                                                             SUBJECTIVE STATEMENT: Left hip is bothering me.     PAIN:  Are you having pain? Yes NPRS scale: 4/10 during the day, 5/10 night Pain location:  left side of pelvis, left SI joint   Pain type: aching SI joint and burning pelvis Pain description: constant    Aggravating factors: carrying things, being up walking around alot Relieving factors: warm  bath, pelvic massage   PRECAUTIONS: None   WEIGHT BEARING RESTRICTIONS: No   FALLS:  Has patient fallen in last 6 months? No   LIVING ENVIRONMENT: Lives with: lives with their family   OCCUPATION: nurse   PLOF: Independent   PATIENT GOALS: reduce pain and balance muscles and reduce diastasis   PERTINENT HISTORY:  hypothyroidism   BOWEL MOVEMENT: Pain with bowel movement: Yes, with hemorrhoids Type of bowel movement:Type (Bristol Stool Scale) type 4 and Strain No Fully empty rectum: Yes:     URINATION: Pain with urination: No, feels like she has a bladder infection Fully empty bladder: Yes:   Stream: Strong Urgency: No Frequency: average  Leakage: Coughing and Sneezing Pads: No   INTERCOURSE:  Pain with intercourse: During Penetration uncomfortable Ability to have vaginal penetration:  Yes:   Marinoff Scale: 1/3   PREGNANCY: Vaginal deliveries 5 Tearing No   PROLAPSE: Initial had some but now not     OBJECTIVE:    PATIENT SURVEYS:  POPIQ-7 19   COGNITION: Overall cognitive status: Within functional limits for tasks assessed                          SENSATION: Light touch: Appears intact Proprioception: Appears intact     LUMBAR SPECIAL TESTS:  SI Compression/distraction test: Negative left        POSTURE: No Significant postural limitations   PELVIC ALIGNMENT: left ilium is posteriorly rotated   LUMBARAROM/PROM:   A/PROM A/PROM  eval  Flexion Full but increased  muscle mass on the left  Extension Decreased by 25% from L1-L5, pressure in left hip   Right lateral flexion Decreased by 25%  Left lateral flexion full  Right rotation Decreased by 25%  Left rotation Decreased by 25%   (Blank rows = not tested)   LOWER EXTREMITY ROM: ER decreased by 25% bil.    LOWER EXTREMITY MMT:   MMT Right eval Left eval  Hip extension 4/5 4/5  Hip abduction 4/5 4/5  Hip adduction 4/5 4/5    PALPATION:   General  weak abdominal contraction                 External Perineal Exam left ischiocavernosus, left urogenital diaphragm, good coloring                             Internal Pelvic Floor left ischiocavernosus, left iliococcygeus, left ischiocavernosus, restrictions on the left side of the bladder and urethra Patient confirms identification and approves PT to assess internal pelvic floor and treatment Yes   PELVIC MMT:   MMT eval  Vaginal 2/5 hold 5 sec  Diastasis Recti 1 finger width  (Blank rows = not tested)         TONE: good   PROLAPSE: none   TODAY'S TREATMENT:    12/05/2022 Manual: Soft tissue mobilization: To assess for dry needling Manual work to left quadratus, lumbar multifidi, left gluteal, left piriformis, left hip external rotator  to elongate after dry needling Spinal mobilization:  PA mobilization to L1-L5 grade 3 Right rotational mobilization to sacrum Muscle energy to left ilium Trigger Point Dry-Needling  Treatment instructions: Expect mild to moderate muscle soreness. S/S of pneumothorax if dry needled over a lung field, and to seek immediate medical attention should they occur. Patient verbalized understanding of these instructions and education.  Patient Consent Given: Yes Education handout provided: Yes Muscles treated:  left quadratus, left gluteus medius, left lumbar multifidi, left piriformis, left hip external rotators Electrical stimulation performed: No Parameters: N/A Treatment response/outcome: elongation  of muscles and trigger point response                                                                                                                               DATE: 11/28/22  EVAL eval finished     PATIENT EDUCATION:  Education details: educated patient on what to expect for the next visit Person educated: Patient Education method: Explanation Education comprehension: verbalized understanding   HOME EXERCISE PROGRAM: See above   ASSESSMENT:   CLINICAL IMPRESSION: Patient is a 32 y.o. female who was seen today for physical therapy  treatment for pelvic and perineal pain. Patient burning pain in the urethra was decreased after the manual work. Her pelvis was in correct alignment after muscle energy. She responded well to the dry needling. She had trigger points in the gluteus medius and piriformis and lumbar.  Patient will benefit from skilled therapy to improve tissue mobility and strength to reduce pain to make everyday activities easier.    OBJECTIVE IMPAIRMENTS: decreased activity tolerance, decreased coordination, decreased endurance, decreased mobility, decreased ROM, decreased strength, increased fascial restrictions, and pain.    ACTIVITY LIMITATIONS: carrying, lifting, bending, standing, squatting, continence, and caring for others   PARTICIPATION LIMITATIONS: meal prep, cleaning, laundry, shopping, and community activity   PERSONAL FACTORS: Fitness and 1-2 comorbidities: hypothyroidism, recent vaginal birth on 10/12/22  are also affecting patient's functional outcome.    REHAB POTENTIAL: Excellent   CLINICAL DECISION MAKING: Evolving/moderate complexity   EVALUATION COMPLEXITY: Low     GOALS: Goals reviewed with patient? Yes   SHORT TERM GOALS: Target date: 12/26/22   Patient independent with initial HEP for SI mobility.  Baseline:not educated yet Goal status: INITIAL   2.  Patient is able to bulge fully and contract in a circle with the pelvic floor Baseline:  unable to contract and hug the therapist finger Goal status: INITIAL   3.  Patient is able to fully contract the upper and lower abdominals to work on stability.  Baseline: Not able to  Goal status: INITIAL     LONG TERM GOALS: Target date: 02/20/2023   Patient independent with advanced HEP for core, pelvic floor and SI stability.  Baseline: not educated yet Goal status: INITIAL   2.  Patient is able to sit for 45 minutes with pain decreased >/= 1-2/10.  Baseline: 5/10 Goal status: INITIAL   3.  Patient able to carry her infant in the carrier with pain level >/= 1-2/10 due to decreased trigger points in the pelvic floor. Baseline: pain level 5/10 Goal status: INITIAL   4.  Patient constant discomfort of feeling she has a UTI has improved >/= 80% due to release of the pelvic floor.  Baseline: Constantly feels she has a UTI  Goal status: INITIAL   5. Patient POPIQ-7  is </= 8 compared  POPIQ-7 19 due to decreased pain and improved SI mobility.  Baseline:  Goal status: INITIAL     PLAN:   PT FREQUENCY: 1x/week   PT DURATION: 12 weeks   PLANNED INTERVENTIONS: Therapeutic exercises, Therapeutic activity, Neuromuscular re-education, Patient/Family education, Joint mobilization, Dry Needling, Spinal mobilization, Taping, Biofeedback, and Manual therapy   PLAN FOR NEXT SESSION: manual work on the left side of pelvic floor and along the bladder and urethra. See if needs more dry needling. Core engagement  Earlie Counts, PT 12/05/22 11:59 AM

## 2022-12-08 ENCOUNTER — Ambulatory Visit: Payer: Medicaid Other | Admitting: Physical Therapy

## 2022-12-08 ENCOUNTER — Encounter: Payer: Self-pay | Admitting: Physical Therapy

## 2022-12-08 DIAGNOSIS — E039 Hypothyroidism, unspecified: Secondary | ICD-10-CM | POA: Diagnosis not present

## 2022-12-08 DIAGNOSIS — R102 Pelvic and perineal pain unspecified side: Secondary | ICD-10-CM

## 2022-12-08 DIAGNOSIS — M6281 Muscle weakness (generalized): Secondary | ICD-10-CM

## 2022-12-08 DIAGNOSIS — M5441 Lumbago with sciatica, right side: Secondary | ICD-10-CM

## 2022-12-08 DIAGNOSIS — M62838 Other muscle spasm: Secondary | ICD-10-CM | POA: Diagnosis not present

## 2022-12-08 DIAGNOSIS — M6208 Separation of muscle (nontraumatic), other site: Secondary | ICD-10-CM | POA: Diagnosis not present

## 2022-12-08 NOTE — Therapy (Signed)
OUTPATIENT PHYSICAL THERAPY TREATMENT NOTE   Patient Name: Olivia Shepard MRN: [id number] DOB:01/27/1991, 32 y.o., female Today's Date: 12/08/2022  PCP: Martyn Malay, MD  REFERRING PROVIDER: Tona Sensing, FNP   END OF SESSION:   PT End of Session - 12/08/22 1652     Visit Number 3    Date for PT Re-Evaluation 02/20/23    Authorization Type [employer]    Authorization Time Period 1/19-3/18    Authorization - Visit Number 2    Authorization - Number of Visits 6    PT Start Time 1620    PT Stop Time 1653   infant was crying   PT Time Calculation (min) 33 min    Activity Tolerance Patient tolerated treatment well    Behavior During Therapy WFL for tasks assessed/performed             Past Medical History:  Diagnosis Date   Anemia    Anxiety    GERD (gastroesophageal reflux disease)    History of postpartum hemorrhage, currently pregnant    Hypothyroidism    Past Surgical History:  Procedure Laterality Date   ESOPHAGEAL DILATION     Patient Active Problem List   Diagnosis Date Noted   Precipitous delivery, delivered (current hospitalization) 10/12/2022   Indication for care in labor and delivery, antepartum 10/11/2022   Rash 07/31/2022   Other intervertebral disc degeneration, lumbar region 02/09/2022   Fatigue 08/02/2021   Dysphagia 11/14/2020   Pelvic floor dysfunction 10/02/2020   Varicose veins of legs, antepartum 12/19/2019   Plantar wart, right foot 03/18/2018   GERD (gastroesophageal reflux disease) 07/25/2016   Tinea pedis of both feet 07/25/2016   Hypothyroidism 07/19/2015   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   Chronic UTI 10/24/2013   IBS (irritable bowel syndrome) 06/29/2012   REFERRING DIAG: R10.2 (ICD-10-CM) - Pelvic and perineal pain    THERAPY DIAG:  Muscle weakness (generalized)   Pelvic pain   Rationale for Evaluation and Treatment: Rehabilitation   ONSET DATE: 2/23   SUBJECTIVE:                                                                                                                                                                                             SUBJECTIVE STATEMENT: I felt good until yesterday.    PAIN:  Are you having pain? Yes NPRS scale: 4/10 during the day, 5/10 night Pain location:  left side of pelvis, left SI joint   Pain type: aching SI joint and burning pelvis Pain description: constant    Aggravating factors: carrying things, being up walking around alot Relieving  factors: warm bath, pelvic massage   PRECAUTIONS: None   WEIGHT BEARING RESTRICTIONS: No   FALLS:  Has patient fallen in last 6 months? No   LIVING ENVIRONMENT: Lives with: lives with their family   OCCUPATION: nurse   PLOF: Independent   PATIENT GOALS: reduce pain and balance muscles and reduce diastasis   PERTINENT HISTORY:  hypothyroidism   BOWEL MOVEMENT: Pain with bowel movement: Yes, with hemorrhoids Type of bowel movement:Type (Bristol Stool Scale) type 4 and Strain No Fully empty rectum: Yes:     URINATION: Pain with urination: No, feels like she has a bladder infection Fully empty bladder: Yes:   Stream: Strong Urgency: No Frequency: average  Leakage: Coughing and Sneezing Pads: No   INTERCOURSE:  Pain with intercourse: During Penetration uncomfortable Ability to have vaginal penetration:  Yes:   Marinoff Scale: 1/3   PREGNANCY: Vaginal deliveries 5 Tearing No   PROLAPSE: Initial had some but now not     OBJECTIVE:    PATIENT SURVEYS:  POPIQ-7 19   COGNITION: Overall cognitive status: Within functional limits for tasks assessed                          SENSATION: Light touch: Appears intact Proprioception: Appears intact     LUMBAR SPECIAL TESTS:  SI Compression/distraction test: Negative left        POSTURE: No Significant postural limitations   PELVIC ALIGNMENT: left ilium is posteriorly rotated   LUMBARAROM/PROM:   A/PROM A/PROM  eval  Flexion  Full but increased muscle mass on the left  Extension Decreased by 25% from L1-L5, pressure in left hip   Right lateral flexion Decreased by 25%  Left lateral flexion full  Right rotation Decreased by 25%  Left rotation Decreased by 25%   (Blank rows = not tested)   LOWER EXTREMITY ROM: ER decreased by 25% bil.    LOWER EXTREMITY MMT:   MMT Right eval Left eval  Hip extension 4/5 4/5  Hip abduction 4/5 4/5  Hip adduction 4/5 4/5    PALPATION:   General  weak abdominal contraction                 External Perineal Exam left ischiocavernosus, left urogenital diaphragm, good coloring                             Internal Pelvic Floor left ischiocavernosus, left iliococcygeus, left ischiocavernosus, restrictions on the left side of the bladder and urethra Patient confirms identification and approves PT to assess internal pelvic floor and treatment Yes   PELVIC MMT:   MMT eval 12/08/22  Vaginal 2/5 hold 5 sec 2/5  Diastasis Recti 1 finger width   (Blank rows = not tested)         TONE: good   PROLAPSE: none   TODAY'S TREATMENT:    12/08/22 Manual: Soft tissue mobilization: To assess for dry needling Manual work to left gluteal and piriformis to elongate after dry needling Internal pelvic floor techniques: No emotional/communication barriers or cognitive limitation. Patient is motivated to learn. Patient understands and agrees with treatment goals and plan. PT explains patient will be examined in standing, sitting, and lying down to see how their muscles and joints work. When they are ready, they will be asked to remove their underwear so PT can examine their perineum. The patient is also given the option of providing their own  chaperone as one is not provided in our facility. The patient also has the right and is explained the right to defer or refuse any part of the evaluation or treatment including the internal exam. With the patient's consent, PT will use one gloved finger  to gently assess the muscles of the pelvic floor, seeing how well it contracts and relaxes and if there is muscle symmetry. After, the patient will get dressed and PT and patient will discuss exam findings and plan of care. PT and patient discuss plan of care, schedule, attendance policy and HEP activities.  Going through the vagina working on the left pelvic floor muscles, releasing trigger points, coccyx distraction, manual work to the sacral tuberous ligament, fascial release along the urethra in left sidely Tapping to the pelvic floor muscle to work on Engineer, structural Dry-Needling  Treatment instructions: Expect mild to moderate muscle soreness. S/S of pneumothorax if dry needled over a lung field, and to seek immediate medical attention should they occur. Patient verbalized understanding of these instructions and education.  Patient Consent Given: Yes Education handout provided: Previously provided Muscles treated: left gluteus medius, gluteus maximus and piriformis Electrical stimulation performed: No Parameters: N/A Treatment response/outcome: elongation of muscles and trigger point response     12/05/2022 Manual: Soft tissue mobilization: To assess for dry needling Manual work to left quadratus, lumbar multifidi, left gluteal, left piriformis, left hip external rotator  to elongate after dry needling Spinal mobilization:      PA mobilization to L1-L5 grade 3 Right rotational mobilization to sacrum Muscle energy to left ilium Trigger Point Dry-Needling  Treatment instructions: Expect mild to moderate muscle soreness. S/S of pneumothorax if dry needled over a lung field, and to seek immediate medical attention should they occur. Patient verbalized understanding of these instructions and education.   Patient Consent Given: Yes Education handout provided: Yes Muscles treated: left quadratus, left gluteus medius, left lumbar multifidi, left piriformis, left hip external  rotators Electrical stimulation performed: No Parameters: N/A Treatment response/outcome: elongation of muscles and trigger point response                                                                                                                                DATE: 11/28/22  EVAL eval finished     PATIENT EDUCATION:  Education details: educated patient on what to expect for the next visit Person educated: Patient Education method: Explanation Education comprehension: verbalized understanding   HOME EXERCISE PROGRAM: See above   ASSESSMENT:   CLINICAL IMPRESSION: Patient is a 32 y.o. female who was seen today for physical therapy  treatment for pelvic and perineal pain. Patient responded well to the dry needling.  Pelvic floor strength is 2/5. She had trigger points in the left coccygeus, left iliococcygeus, along the [place] canal. The sacrotuberous ligament was tight. She had less burning and pain after manual work. She had no pain from last treatment till yesterday. Patient  will benefit from skilled therapy to improve tissue mobility and strength to reduce pain to make everyday activities easier.    OBJECTIVE IMPAIRMENTS: decreased activity tolerance, decreased coordination, decreased endurance, decreased mobility, decreased ROM, decreased strength, increased fascial restrictions, and pain.    ACTIVITY LIMITATIONS: carrying, lifting, bending, standing, squatting, continence, and caring for others   PARTICIPATION LIMITATIONS: meal prep, cleaning, laundry, shopping, and community activity   PERSONAL FACTORS: Fitness and 1-2 comorbidities: hypothyroidism, recent vaginal birth on 10/12/22  are also affecting patient's functional outcome.    REHAB POTENTIAL: Excellent   CLINICAL DECISION MAKING: Evolving/moderate complexity   EVALUATION COMPLEXITY: Low     GOALS: Goals reviewed with patient? Yes   SHORT TERM GOALS: Target date: 12/26/22   Patient independent with initial  HEP for SI mobility.  Baseline:not educated yet Goal status: INITIAL   2.  Patient is able to bulge fully and contract in a circle with the pelvic floor Baseline: unable to contract and hug the therapist finger Goal status: ongoing 01/08/23   3.  Patient is able to fully contract the upper and lower abdominals to work on stability.  Baseline: Not able to  Goal status: INITIAL     LONG TERM GOALS: Target date: 02/20/2023   Patient independent with advanced HEP for core, pelvic floor and SI stability.  Baseline: not educated yet Goal status: INITIAL   2.  Patient is able to sit for 45 minutes with pain decreased >/= 1-2/10.  Baseline: 5/10 Goal status: INITIAL   3.  Patient able to carry her infant in the carrier with pain level >/= 1-2/10 due to decreased trigger points in the pelvic floor. Baseline: pain level 5/10 Goal status: INITIAL   4.  Patient constant discomfort of feeling she has a UTI has improved >/= 80% due to release of the pelvic floor.  Baseline: Constantly feels she has a UTI  Goal status: INITIAL   5. Patient POPIQ-7 is </= 8 compared  POPIQ-7 19 due to decreased pain and improved SI mobility.  Baseline:  Goal status: INITIAL     PLAN:   PT FREQUENCY: 1x/week   PT DURATION: 12 weeks   PLANNED INTERVENTIONS: Therapeutic exercises, Therapeutic activity, Neuromuscular re-education, Patient/Family education, Joint mobilization, Dry Needling, Spinal mobilization, Taping, Biofeedback, and Manual therapy   PLAN FOR NEXT SESSION: manual work on the left side of pelvic floor and along the bladder and urethra. See if needs more dry needling. Core engagement  Earlie Counts, PT 12/08/22 5:05 PM

## 2022-12-09 ENCOUNTER — Encounter: Payer: Medicaid Other | Admitting: Physical Therapy

## 2022-12-17 ENCOUNTER — Encounter: Payer: Self-pay | Admitting: Physical Therapy

## 2022-12-17 ENCOUNTER — Ambulatory Visit: Payer: Medicaid Other | Attending: Obstetrics and Gynecology | Admitting: Physical Therapy

## 2022-12-17 DIAGNOSIS — R252 Cramp and spasm: Secondary | ICD-10-CM | POA: Diagnosis not present

## 2022-12-17 DIAGNOSIS — M5441 Lumbago with sciatica, right side: Secondary | ICD-10-CM | POA: Diagnosis not present

## 2022-12-17 DIAGNOSIS — R102 Pelvic and perineal pain: Secondary | ICD-10-CM | POA: Diagnosis not present

## 2022-12-17 DIAGNOSIS — M6281 Muscle weakness (generalized): Secondary | ICD-10-CM | POA: Diagnosis not present

## 2022-12-17 NOTE — Therapy (Signed)
OUTPATIENT PHYSICAL THERAPY TREATMENT NOTE   Patient Name: Olivia Shepard MRN: [id number] DOB:[birth year], 32 y.o., female Today's Date: 12/17/2022  PCP: Martyn Malay, MD   REFERRING PROVIDER: Tona Sensing, FNP    END OF SESSION:   PT End of Session - 12/17/22 1537     Visit Number 4    Date for PT Re-Evaluation 02/20/23    Authorization Type [employer]    Authorization Time Period 1/19-3/18    Authorization - Visit Number 3    Authorization - Number of Visits 6    PT Start Time 4163    PT Stop Time 8453    PT Time Calculation (min) 38 min    Activity Tolerance Patient tolerated treatment well    Behavior During Therapy WFL for tasks assessed/performed             Past Medical History:  Diagnosis Date   Anemia    Anxiety    GERD (gastroesophageal reflux disease)    History of postpartum hemorrhage, currently pregnant    Hypothyroidism    Past Surgical History:  Procedure Laterality Date   ESOPHAGEAL DILATION     Patient Active Problem List   Diagnosis Date Noted   Precipitous delivery, delivered (current hospitalization) 10/12/2022   Indication for care in labor and delivery, antepartum 10/11/2022   Rash 07/31/2022   Other intervertebral disc degeneration, lumbar region 02/09/2022   Fatigue 08/02/2021   Dysphagia 11/14/2020   Pelvic floor dysfunction 10/02/2020   Varicose veins of legs, antepartum 12/19/2019   Plantar wart, right foot 03/18/2018   GERD (gastroesophageal reflux disease) 07/25/2016   Tinea pedis of both feet 07/25/2016   Hypothyroidism 07/19/2015   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   Chronic UTI 10/24/2013   IBS (irritable bowel syndrome) 06/29/2012   REFERRING DIAG: R10.2 (ICD-10-CM) - Pelvic and perineal pain    THERAPY DIAG:  Muscle weakness (generalized)   Pelvic pain   Rationale for Evaluation and Treatment: Rehabilitation   ONSET DATE: 2/23   SUBJECTIVE:                                                                                                                                                                                             SUBJECTIVE STATEMENT: I fell last Thursday due to stepping on my child's slipper. I bruised my right hip.  I felt good until yesterday.    PAIN:  Are you having pain? Yes NPRS scale: 3/10 during the day, 3/10 night Pain location:  left side of pelvis, left SI joint   Pain type: aching SI joint and burning pelvis Pain  description: constant    Aggravating factors: carrying things, being up walking around alot Relieving factors: warm bath, pelvic massage   PRECAUTIONS: None   WEIGHT BEARING RESTRICTIONS: No   FALLS:  Has patient fallen in last 6 months? No   LIVING ENVIRONMENT: Lives with: lives with their family   OCCUPATION: nurse   PLOF: Independent   PATIENT GOALS: reduce pain and balance muscles and reduce diastasis   PERTINENT HISTORY:  hypothyroidism   BOWEL MOVEMENT: Pain with bowel movement: Yes, with hemorrhoids Type of bowel movement:Type (Bristol Stool Scale) type 4 and Strain No Fully empty rectum: Yes:     URINATION: Pain with urination: No, feels like she has a bladder infection Fully empty bladder: Yes:   Stream: Strong Urgency: No Frequency: average  Leakage: Coughing and Sneezing Pads: No   INTERCOURSE:  Pain with intercourse: During Penetration uncomfortable Ability to have vaginal penetration:  Yes:   Marinoff Scale: 1/3   PREGNANCY: Vaginal deliveries 5 Tearing No   PROLAPSE: Initial had some but now not     OBJECTIVE:    PATIENT SURVEYS:  POPIQ-7 19   COGNITION: Overall cognitive status: Within functional limits for tasks assessed                          SENSATION: Light touch: Appears intact Proprioception: Appears intact     LUMBAR SPECIAL TESTS:  SI Compression/distraction test: Negative left        POSTURE: No Significant postural limitations   PELVIC ALIGNMENT: left ilium is  posteriorly rotated   LUMBARAROM/PROM:   A/PROM A/PROM  eval  Flexion Full but increased muscle mass on the left  Extension Decreased by 25% from L1-L5, pressure in left hip   Right lateral flexion Decreased by 25%  Left lateral flexion full  Right rotation Decreased by 25%  Left rotation Decreased by 25%   (Blank rows = not tested)   LOWER EXTREMITY ROM: ER decreased by 25% bil.    LOWER EXTREMITY MMT:   MMT Right eval Left eval  Hip extension 4/5 4/5  Hip abduction 4/5 4/5  Hip adduction 4/5 4/5    PALPATION:   General  weak abdominal contraction                 External Perineal Exam left ischiocavernosus, left urogenital diaphragm, good coloring                             Internal Pelvic Floor left ischiocavernosus, left iliococcygeus, left ischiocavernosus, restrictions on the left side of the bladder and urethra Patient confirms identification and approves PT to assess internal pelvic floor and treatment Yes   PELVIC MMT:   MMT eval 12/08/22  Vaginal 2/5 hold 5 sec 2/5  Diastasis Recti 1 finger width    (Blank rows = not tested)         TONE: good   PROLAPSE: none   TODAY'S TREATMENT:    12/17/22 Manual: Soft tissue mobilization: To assess for dry needling Manual work to left lumbar paraspinals, left piriformis, left SI joint, left hamstring, left puborectalis, left superior transverse, and left ischiocavernosus Trigger Point Dry-Needling  Treatment instructions: Expect mild to moderate muscle soreness. S/S of pneumothorax if dry needled over a lung field, and to seek immediate medical attention should they occur. Patient verbalized understanding of these instructions and education.  Patient Consent Given: Yes Education handout provided: Previously  provided Muscles treated: left lumbar multifidi, left piriformis, along left SI joint, left puborectalis, left superior transverse, left ischiocavernosus, and left medial hamstring Electrical stimulation  performed: No Parameters: N/A Treatment response/outcome: elongation of muscle and trigger point response  Neuromuscular re-education: Core retraining: Hip flexion with red band supine with transverse abdominus contraction 10x each side and preventing popping of the SI joint Bridge  with knees out using red band 15 x Supine with one knee and hip into flexion and red band around hands moving up and down Exercises: Stretches/mobility: Bilateral hamstring stretch holding 30 sec in supine Bilateral piriformis stretch holding for 30 sec. In supine    12/08/22 Manual: Soft tissue mobilization: To assess for dry needling Manual work to left gluteal and piriformis to elongate after dry needling Internal pelvic floor techniques: No emotional/communication barriers or cognitive limitation. Patient is motivated to learn. Patient understands and agrees with treatment goals and plan. PT explains patient will be examined in standing, sitting, and lying down to see how their muscles and joints work. When they are ready, they will be asked to remove their underwear so PT can examine their perineum. The patient is also given the option of providing their own chaperone as one is not provided in our facility. The patient also has the right and is explained the right to defer or refuse any part of the evaluation or treatment including the internal exam. With the patient's consent, PT will use one gloved finger to gently assess the muscles of the pelvic floor, seeing how well it contracts and relaxes and if there is muscle symmetry. After, the patient will get dressed and PT and patient will discuss exam findings and plan of care. PT and patient discuss plan of care, schedule, attendance policy and HEP activities.  Going through the vagina working on the left pelvic floor muscles, releasing trigger points, coccyx distraction, manual work to the sacral tuberous ligament, fascial release along the urethra in left  sidely Tapping to the pelvic floor muscle to work on Engineer, structural Dry-Needling  Treatment instructions: Expect mild to moderate muscle soreness. S/S of pneumothorax if dry needled over a lung field, and to seek immediate medical attention should they occur. Patient verbalized understanding of these instructions and education.   Patient Consent Given: Yes Education handout provided: Previously provided Muscles treated: left gluteus medius, gluteus maximus and piriformis Electrical stimulation performed: No Parameters: N/A Treatment response/outcome: elongation of muscles and trigger point response      12/05/2022 Manual: Soft tissue mobilization: To assess for dry needling Manual work to left quadratus, lumbar multifidi, left gluteal, left piriformis, left hip external rotator  to elongate after dry needling Spinal mobilization:      PA mobilization to L1-L5 grade 3 Right rotational mobilization to sacrum Muscle energy to left ilium Trigger Point Dry-Needling  Treatment instructions: Expect mild to moderate muscle soreness. S/S of pneumothorax if dry needled over a lung field, and to seek immediate medical attention should they occur. Patient verbalized understanding of these instructions and education.   Patient Consent Given: Yes Education handout provided: Yes Muscles treated: left quadratus, left gluteus medius, left lumbar multifidi, left piriformis, left hip external rotators Electrical stimulation performed: No Parameters: N/A Treatment response/outcome: elongation of muscles and trigger point response  DATE: 11/28/22  EVAL eval finished   PATIENT EDUCATION: Education details: Access Code: [email or web address] Person educated: Patient Education method: Explanation, Demonstration, Tactile cues, Verbal cues, and Handouts Education comprehension:  verbalized understanding, returned demonstration, verbal cues required, tactile cues required, and needs further education     HOME EXERCISE PROGRAM: 12/17/22 Access Code: [email or web address] URL: [email or web address] Date: 12/17/2022 Prepared by: Earlie Counts  Program Notes lay on back, put one leg at 90/90, red band around hands, move arms up and down 10x each side.   Exercises - Supine March  - 1 x daily - 3 x weekly - 1 sets - 10 reps - Bridge with Hip Abduction and Resistance  - 1 x daily - 3 x weekly - 1 sets - 10 reps    ASSESSMENT:   CLINICAL IMPRESSION: Patient is a 32 y.o. female who was seen today for physical therapy  treatment for pelvic and perineal pain.No burning pain during the day just at night now. Pain level decreased to 3/10. Patient had clicking in the right SI joint with hip flexion in supine but when the lower abdominals are engaged she does not. Patient had pressure and burning in the urethra with dry needling of the puborectalis, ischiocavernosus, and superior transverse. She is able to contract the upper and lower abdominals together. Patient will benefit from skilled therapy to improve tissue mobility and strength to reduce pain to make everyday activities easier.    OBJECTIVE IMPAIRMENTS: decreased activity tolerance, decreased coordination, decreased endurance, decreased mobility, decreased ROM, decreased strength, increased fascial restrictions, and pain.    ACTIVITY LIMITATIONS: carrying, lifting, bending, standing, squatting, continence, and caring for others   PARTICIPATION LIMITATIONS: meal prep, cleaning, laundry, shopping, and community activity   PERSONAL FACTORS: Fitness and 1-2 comorbidities: hypothyroidism, recent vaginal birth on 10/12/22  are also affecting patient's functional outcome.    REHAB POTENTIAL: Excellent   CLINICAL DECISION MAKING: Evolving/moderate complexity   EVALUATION COMPLEXITY: Low     GOALS: Goals reviewed with  patient? Yes   SHORT TERM GOALS: Target date: 12/26/22   Patient independent with initial HEP for SI mobility.  Baseline:not educated yet Goal status:Met 12/17/22   2.  Patient is able to bulge fully and contract in a circle with the pelvic floor Baseline: unable to contract and hug the therapist finger Goal status: ongoing 01/08/23   3.  Patient is able to fully contract the upper and lower abdominals to work on stability.  Baseline: Not able to  Goal status: Met 12/17/22     LONG TERM GOALS: Target date: 02/20/2023   Patient independent with advanced HEP for core, pelvic floor and SI stability.  Baseline: not educated yet Goal status: INITIAL   2.  Patient is able to sit for 45 minutes with pain decreased >/= 1-2/10.  Baseline: 5/10 Goal status: INITIAL   3.  Patient able to carry her infant in the carrier with pain level >/= 1-2/10 due to decreased trigger points in the pelvic floor. Baseline: pain level 5/10 Goal status: INITIAL   4.  Patient constant discomfort of feeling she has a UTI has improved >/= 80% due to release of the pelvic floor.  Baseline: Constantly feels she has a UTI  Goal status: INITIAL   5. Patient POPIQ-7 is </= 8 compared  POPIQ-7 19 due to decreased pain and improved SI mobility.  Baseline:  Goal status: INITIAL     PLAN:   PT FREQUENCY: 1x/week   PT DURATION: 12 weeks   PLANNED  INTERVENTIONS: Therapeutic exercises, Therapeutic activity, Neuromuscular re-education, Patient/Family education, Joint mobilization, Dry Needling, Spinal mobilization, Taping, Biofeedback, and Manual therapy   PLAN FOR NEXT SESSION: manual work on the left side of pelvic floor and along the bladder and urethra. See if needs more dry needling. Core engagement  Earlie Counts, PT 12/17/22 4:23 PM

## 2022-12-22 DIAGNOSIS — F4322 Adjustment disorder with anxiety: Secondary | ICD-10-CM | POA: Diagnosis not present

## 2022-12-31 ENCOUNTER — Encounter: Payer: Self-pay | Admitting: Physical Therapy

## 2022-12-31 ENCOUNTER — Ambulatory Visit: Payer: Medicaid Other | Admitting: Physical Therapy

## 2022-12-31 DIAGNOSIS — M6281 Muscle weakness (generalized): Secondary | ICD-10-CM | POA: Diagnosis not present

## 2022-12-31 DIAGNOSIS — M5441 Lumbago with sciatica, right side: Secondary | ICD-10-CM

## 2022-12-31 DIAGNOSIS — R102 Pelvic and perineal pain: Secondary | ICD-10-CM

## 2022-12-31 DIAGNOSIS — R252 Cramp and spasm: Secondary | ICD-10-CM | POA: Diagnosis not present

## 2022-12-31 NOTE — Therapy (Signed)
OUTPATIENT PHYSICAL THERAPY TREATMENT NOTE   Patient Name: Lidia Donalds MRN: [id number] DOB:11-Jan-1991, 32 y.o., female Today's Date: 12/31/2022  PCP: Martyn Malay, MD  REFERRING PROVIDER: Tona Sensing, FNP   END OF SESSION:   PT End of Session - 12/31/22 1533     Visit Number 5    Date for PT Re-Evaluation 02/20/23    Authorization Type [employer]    Authorization Time Period 1/19-3/18    Authorization - Visit Number 4    Authorization - Number of Visits 6    PT Start Time [id number]    PT Stop Time 1610    PT Time Calculation (min) 40 min    Activity Tolerance Patient tolerated treatment well    Behavior During Therapy WFL for tasks assessed/performed             Past Medical History:  Diagnosis Date   Anemia    Anxiety    GERD (gastroesophageal reflux disease)    History of postpartum hemorrhage, currently pregnant    Hypothyroidism    Past Surgical History:  Procedure Laterality Date   ESOPHAGEAL DILATION     Patient Active Problem List   Diagnosis Date Noted   Precipitous delivery, delivered (current hospitalization) 10/12/2022   Indication for care in labor and delivery, antepartum 10/11/2022   Rash 07/31/2022   Other intervertebral disc degeneration, lumbar region 02/09/2022   Fatigue 08/02/2021   Dysphagia 11/14/2020   Pelvic floor dysfunction 10/02/2020   Varicose veins of legs, antepartum 12/19/2019   Plantar wart, right foot 03/18/2018   GERD (gastroesophageal reflux disease) 07/25/2016   Tinea pedis of both feet 07/25/2016   Hypothyroidism 07/19/2015   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   Chronic UTI 10/24/2013   IBS (irritable bowel syndrome) 06/29/2012   REFERRING DIAG: R10.2 (ICD-10-CM) - Pelvic and perineal pain    THERAPY DIAG:  Muscle weakness (generalized)   Pelvic pain   Rationale for Evaluation and Treatment: Rehabilitation   ONSET DATE: 2/23   SUBJECTIVE:                                                                                                                                                                                             SUBJECTIVE STATEMENT: I have not had the pain in my pelvis. I have a pain in the right side by the right scapula.   I felt good until yesterday.    PAIN:  Are you having pain? Yes NPRS scale: 3/10 during the day, 3/10 night Pain location:  left side of pelvis, left SI joint   Pain type: aching SI joint  and burning pelvis Pain description: constant    Aggravating factors: carrying things, being up walking around alot Relieving factors: warm bath, pelvic massage   PRECAUTIONS: None   WEIGHT BEARING RESTRICTIONS: No   FALLS:  Has patient fallen in last 6 months? No   LIVING ENVIRONMENT: Lives with: lives with their family   OCCUPATION: nurse   PLOF: Independent   PATIENT GOALS: reduce pain and balance muscles and reduce diastasis   PERTINENT HISTORY:  hypothyroidism   BOWEL MOVEMENT: Pain with bowel movement: Yes, with hemorrhoids Type of bowel movement:Type (Bristol Stool Scale) type 4 and Strain No Fully empty rectum: Yes:     URINATION: Pain with urination: No, feels like she has a bladder infection Fully empty bladder: Yes:   Stream: Strong Urgency: No Frequency: average  Leakage: no Pads: No   INTERCOURSE:  Pain with intercourse: During Penetration uncomfortable Ability to have vaginal penetration:  Yes:   Marinoff Scale: 1/3   PREGNANCY: Vaginal deliveries 5 Tearing No   PROLAPSE: Initial had some but now not     OBJECTIVE:    PATIENT SURVEYS:  POPIQ-7 19   COGNITION: Overall cognitive status: Within functional limits for tasks assessed                          SENSATION: Light touch: Appears intact Proprioception: Appears intact     LUMBAR SPECIAL TESTS:  SI Compression/distraction test: Negative left        POSTURE: No Significant postural limitations   PELVIC ALIGNMENT: 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    LOWER EXTREMITY MMT:   MMT Right eval Left eval  Hip extension 4/5 4/5  Hip abduction 4/5 4/5  Hip adduction 4/5 4/5    PALPATION:   General  weak abdominal contraction                 External Perineal Exam left ischiocavernosus, left urogenital diaphragm, good coloring                             Internal Pelvic Floor left ischiocavernosus, left iliococcygeus, left ischiocavernosus, restrictions on the left side of the bladder and urethra Patient confirms identification and approves PT to assess internal pelvic floor and treatment Yes   PELVIC MMT:   MMT eval 12/08/22  Vaginal 2/5 hold 5 sec 2/5  Diastasis Recti 1 finger width    (Blank rows = not tested)         TONE: good   PROLAPSE: none   TODAY'S TREATMENT:    12/31/22 Manual: Soft tissue mobilization:  To assess for dry needling Manual work to bilateral thoracic paraspinals Spinal mobilization: PA and rotational mobilization to T3-T10 and posterior rib mobilization Internal pelvic floor techniques: Trigger Point Dry-Needling  Treatment instructions: Expect mild to moderate muscle soreness. S/S of pneumothorax if dry needled over a lung field, and to seek immediate medical attention should they occur. Patient verbalized understanding of these instructions and education.  Patient Consent Given: Yes  Education handout provided: Previously provided Muscles treated: thoracic paraspinalis Electrical stimulation performed: No Parameters: N/A Treatment response/outcome: elongation of muscles and trigger point  response  Exercises: Stretches/mobility: Supine with foam roll horizontal to the spine and roll on it to increase thoracic extension Childs pose with arms on foam roll and working on thoracic rotation Strengthening: Supine on foam roll with spine along foam roll and bilateral shoulder flexion, snow angels, scapula retraction and protraction Supine on foam roll with marching Sit on ball and stretch the thoracic spine  12/17/22 Manual: Soft tissue mobilization: To assess for dry needling Manual work to left lumbar paraspinals, left piriformis, left SI joint, left hamstring, left puborectalis, left superior transverse, and left ischiocavernosus Trigger Point Dry-Needling  Treatment instructions: Expect mild to moderate muscle soreness. S/S of pneumothorax if dry needled over a lung field, and to seek immediate medical attention should they occur. Patient verbalized understanding of these instructions and education.   Patient Consent Given: Yes Education handout provided: Previously provided Muscles treated: left lumbar multifidi, left piriformis, along left SI joint, left puborectalis, left superior transverse, left ischiocavernosus, and left medial hamstring Electrical stimulation performed: No Parameters: N/A Treatment response/outcome: elongation of muscle and trigger point response   Neuromuscular re-education: Core retraining: Hip flexion with red band supine with transverse abdominus contraction 10x each side and preventing popping of the SI joint Bridge  with knees out using red band 15 x Supine with one knee and hip into flexion and red band around hands moving up and down Exercises: Stretches/mobility: Bilateral hamstring stretch holding 30 sec in supine Bilateral piriformis stretch holding for 30 sec. In supine    12/08/22 Manual: Soft tissue mobilization: To assess for dry needling Manual work to left gluteal and piriformis to elongate after dry needling Internal pelvic  floor techniques: No emotional/communication barriers or cognitive limitation. Patient is motivated to learn. Patient understands and agrees with treatment goals and plan. PT explains patient will be examined in standing, sitting, and lying down to see how their muscles and joints work. When they are ready, they will be asked to remove their underwear so PT can examine their perineum. The patient is also given the option of providing their own chaperone as one is not provided in our facility. The patient also has the right and is explained the right to defer or refuse any part of the evaluation or treatment including the internal exam. With the patient's consent, PT will use one gloved finger to gently assess the muscles of the pelvic floor, seeing how well it contracts and relaxes and if there is muscle symmetry. After, the patient will get dressed and PT and patient will discuss exam findings and plan of care. PT and patient discuss plan of care, schedule, attendance policy and HEP activities.  Going through the vagina working on the left pelvic floor muscles, releasing trigger points, coccyx distraction, manual work to the sacral tuberous ligament, fascial release along the urethra in left sidely Tapping to the pelvic floor muscle to work on Engineer, structural Dry-Needling  Treatment instructions: Expect mild to moderate muscle soreness. S/S of pneumothorax if dry needled over a lung field, and to seek immediate medical attention should they occur. Patient verbalized understanding of these instructions and education.   Patient Consent Given: Yes Education handout provided: Previously provided Muscles treated: left gluteus medius, gluteus maximus and piriformis Electrical stimulation performed: No Parameters: N/A Treatment response/outcome: elongation of muscles and trigger point response  PATIENT EDUCATION: Education details: Access Code: [email or web address] Person educated: Patient Education method: Explanation, Demonstration, Tactile cues, Verbal cues, and Handouts Education comprehension: verbalized understanding, returned demonstration, verbal cues required, tactile cues required, and needs further education     HOME EXERCISE PROGRAM: 12/17/22 Access Code: [email or web address] URL: [email or web address] Date: 12/17/2022 Prepared by: Earlie Counts   Program Notes lay on back, put one leg at 90/90, red band around hands, move arms up and down 10x each side.    Exercises - Supine March  - 1 x daily - 3 x weekly - 1 sets - 10 reps - Bridge with Hip Abduction and Resistance  - 1 x daily - 3 x weekly - 1 sets - 10 reps     ASSESSMENT:   CLINICAL IMPRESSION: Patient is a 32 y.o. female who was seen today for physical therapy  treatment for pelvic and perineal pain.Patient is not having the pelvic pain and down the leg pain.  She is having pain in the thoracic and right Lumbar sacral area due to spinal alignment getting corrected. She had reduced pain in thoracic and no pain in lumbar sacral area after manual work. She has trigger points in the thoracic paraspinals. Patient is not having any urinary leakage. Patient will benefit from skilled therapy to improve tissue mobility and strength to reduce pain to make everyday activities easier.    OBJECTIVE IMPAIRMENTS: decreased activity tolerance, decreased coordination, decreased endurance, decreased mobility, decreased ROM, decreased strength, increased fascial restrictions, and pain.    ACTIVITY LIMITATIONS: carrying, lifting, bending, standing, squatting, continence, and caring for others   PARTICIPATION LIMITATIONS: meal prep, cleaning, laundry, shopping, and community activity   PERSONAL FACTORS: Fitness and 1-2 comorbidities: hypothyroidism, recent vaginal birth on 10/12/22  are also affecting patient's functional  outcome.    REHAB POTENTIAL: Excellent   CLINICAL DECISION MAKING: Evolving/moderate complexity   EVALUATION COMPLEXITY: Low     GOALS: Goals reviewed with patient? Yes   SHORT TERM GOALS: Target date: 12/26/22   Patient independent with initial HEP for SI mobility.  Baseline:not educated yet Goal status:Met 12/17/22   2.  Patient is able to bulge fully and contract in a circle with the pelvic floor Baseline: unable to contract and hug the therapist finger Goal status: ongoing 01/08/23   3.  Patient is able to fully contract the upper and lower abdominals to work on stability.  Baseline: Not able to  Goal status: Met 12/17/22     LONG TERM GOALS: Target date: 02/20/2023   Patient independent with advanced HEP for core, pelvic floor and SI stability.  Baseline: not educated yet Goal status: INITIAL   2.  Patient is able to sit for 45 minutes with pain decreased >/= 1-2/10.  Baseline: 5/10 Goal status: INITIAL   3.  Patient able to carry her infant in the carrier with pain level >/= 1-2/10 due to decreased trigger points in the pelvic floor. Baseline: pain level 5/10 Goal status: INITIAL   4.  Patient constant discomfort of feeling she has a UTI has improved >/= 80% due to release of the pelvic floor.  Baseline: Constantly feels she has a UTI  Goal status: INITIAL   5. Patient POPIQ-7 is </= 8 compared  POPIQ-7 19 due to decreased pain and improved SI mobility.  Baseline:  Goal status: INITIAL     PLAN:   PT FREQUENCY: 1x/week   PT DURATION: 12 weeks   PLANNED INTERVENTIONS: Therapeutic exercises, Therapeutic activity, Neuromuscular re-education, Patient/Family education, Joint mobilization,  Dry Needling, Spinal mobilization, Taping, Biofeedback, and Manual therapy   PLAN FOR NEXT SESSION:   Core engagement, dry needling or manual work as needed  Earlie Counts, PT 12/31/22 4:18 PM

## 2023-01-05 ENCOUNTER — Ambulatory Visit: Payer: Medicaid Other | Admitting: Physical Therapy

## 2023-01-06 ENCOUNTER — Ambulatory Visit: Payer: Medicaid Other | Admitting: Nurse Practitioner

## 2023-01-16 ENCOUNTER — Ambulatory Visit: Payer: Medicaid Other | Attending: Obstetrics and Gynecology | Admitting: Physical Therapy

## 2023-01-16 ENCOUNTER — Encounter: Payer: Self-pay | Admitting: Physical Therapy

## 2023-01-16 DIAGNOSIS — R102 Pelvic and perineal pain unspecified side: Secondary | ICD-10-CM

## 2023-01-16 DIAGNOSIS — M6281 Muscle weakness (generalized): Secondary | ICD-10-CM

## 2023-01-16 NOTE — Therapy (Signed)
OUTPATIENT PHYSICAL THERAPY TREATMENT NOTE   Patient Name: Olivia Shepard MRN: [id number] DOB:[birth year], 32 y.o., female Today's Date: 01/16/2023  PCP: Martyn Malay, MD  REFERRING PROVIDER: Tona Sensing, FNP   END OF SESSION:   PT End of Session - 01/16/23 0850     Visit Number 6    Date for PT Re-Evaluation 02/20/23    Authorization Type [employer]    Authorization Time Period 1/19-3/18    Authorization - Visit Number 5    Authorization - Number of Visits 6    PT Start Time [id number]   came late   PT Stop Time 0925    PT Time Calculation (min) 35 min    Activity Tolerance Patient tolerated treatment well    Behavior During Therapy [facility] for tasks assessed/performed             Past Medical History:  Diagnosis Date   Anemia    Anxiety    GERD (gastroesophageal reflux disease)    History of postpartum hemorrhage, currently pregnant    Hypothyroidism    Past Surgical History:  Procedure Laterality Date   ESOPHAGEAL DILATION     Patient Active Problem List   Diagnosis Date Noted   Precipitous delivery, delivered (current hospitalization) 10/12/2022   Indication for care in labor and delivery, antepartum 10/11/2022   Rash 07/31/2022   Other intervertebral disc degeneration, lumbar region 02/09/2022   Fatigue 08/02/2021   Dysphagia 11/14/2020   Pelvic floor dysfunction 10/02/2020   Varicose veins of legs, antepartum 12/19/2019   Plantar wart, right foot 03/18/2018   GERD (gastroesophageal reflux disease) 07/25/2016   Tinea pedis of both feet 07/25/2016   Hypothyroidism 07/19/2015   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   Chronic UTI 10/24/2013   IBS (irritable bowel syndrome) 06/29/2012   REFERRING DIAG: R10.2 (ICD-10-CM) - Pelvic and perineal pain    THERAPY DIAG:  Muscle weakness (generalized)   Pelvic pain   Rationale for Evaluation and Treatment: Rehabilitation   ONSET DATE: 2/23   SUBJECTIVE:                                                                                                                                                                                             SUBJECTIVE STATEMENT: The burning is better. I have left hip and lumbar pain. The pain is worse at night.     PAIN:  Are you having pain? Yes NPRS scale: 2/10 during the day, 7/10 night Pain location:  left SI joint and gluteus medius, right interscapular area   Pain type: aching  Pain description: constant  Aggravating factors: being active, sleeping Relieving factors: massage, stretches   PRECAUTIONS: None   WEIGHT BEARING RESTRICTIONS: No   FALLS:  Has patient fallen in last 6 months? No   LIVING ENVIRONMENT: Lives with: lives with their family   OCCUPATION: nurse   PLOF: Independent   PATIENT GOALS: reduce pain and balance muscles and reduce diastasis   PERTINENT HISTORY:  hypothyroidism   BOWEL MOVEMENT: Pain with bowel movement: Yes, with hemorrhoids Type of bowel movement:Type (Bristol Stool Scale) type 4 and Strain No Fully empty rectum: Yes:     URINATION: Pain with urination: No,  Fully empty bladder: Yes:   Stream: Strong Urgency: No Frequency: average  Leakage: no Pads: No   INTERCOURSE:  Pain with intercourse: none Ability to have vaginal penetration:  Yes:   Marinoff Scale: 0/3   PREGNANCY: Vaginal deliveries 5 Tearing No   PROLAPSE: Initial had some but now not     OBJECTIVE:    PATIENT SURVEYS:  POPIQ-7 19   COGNITION: Overall cognitive status: Within functional limits for tasks assessed                          SENSATION: Light touch: Appears intact Proprioception: Appears intact     LUMBAR SPECIAL TESTS:  SI Compression/distraction test: Negative left        POSTURE: No Significant postural limitations   PELVIC ALIGNMENT: left ilium is posteriorly rotated   LUMBARAROM/PROM:   A/PROM A/PROM  eval  Flexion Full but increased muscle mass on the left  Extension Decreased by 25%  from L1-L5, pressure in left hip   Right lateral flexion Decreased by 25%  Left lateral flexion full  Right rotation Decreased by 25%  Left rotation Decreased by 25%   (Blank rows = not tested)   LOWER EXTREMITY ROM: ER decreased by 25% bil.    LOWER EXTREMITY MMT:   MMT Right eval Left eval  Hip extension 4/5 4/5  Hip abduction 4/5 4/5  Hip adduction 4/5 4/5    PALPATION:   General  weak abdominal contraction                 External Perineal Exam left ischiocavernosus, left urogenital diaphragm, good coloring                             Internal Pelvic Floor left ischiocavernosus, left iliococcygeus, left ischiocavernosus, restrictions on the left side of the bladder and urethra Patient confirms identification and approves PT to assess internal pelvic floor and treatment Yes   PELVIC MMT:   MMT eval 12/08/22  Vaginal 2/5 hold 5 sec 2/5  Diastasis Recti 1 finger width    (Blank rows = not tested)         TONE: good   PROLAPSE: none   TODAY'S TREATMENT:    01/16/23 Manual: Soft tissue mobilization: To assess for dry needling Manual work to left gluteal, posterior left hip, left piriformis and left hip rotators, right thoracolumbar iliocostals Spinal mobilization: Gapping of the left L5-S1 facet Internal pelvic floor techniques: Trigger Point Dry-Needling  Treatment instructions: Expect mild to moderate muscle soreness. S/S of pneumothorax if dry needled over a lung field, and to seek immediate medical attention should they occur. Patient verbalized understanding of these instructions and education.  Patient Consent Given: Yes Education handout provided: Previously provided Muscles treated: left gluteus medius and maximus; Gemlius, piriformis, right thoracolumbar coastalis  Electrical stimulation performed: No Parameters: N/A Treatment response/outcome: elongation of muscle and trigger point response Exercises: Stretches/mobility: Standing with increased weight  on right  leg and reach left hand to right foot to stretch right SI joint  12/31/22 Manual: Soft tissue mobilization:  To assess for dry needling Manual work to bilateral thoracic paraspinals Spinal mobilization: PA and rotational mobilization to T3-T10 and posterior rib mobilization Internal pelvic floor techniques: Trigger Point Dry-Needling  Treatment instructions: Expect mild to moderate muscle soreness. S/S of pneumothorax if dry needled over a lung field, and to seek immediate medical attention should they occur. Patient verbalized understanding of these instructions and education.   Patient Consent Given: Yes Education handout provided: Previously provided Muscles treated: thoracic paraspinalis Electrical stimulation performed: No Parameters: N/A Treatment response/outcome: elongation of muscles and trigger point response   Exercises: Stretches/mobility: Supine with foam roll horizontal to the spine and roll on it to increase thoracic extension Ardine Eng pose with arms on foam roll and working on thoracic rotation Strengthening: Supine on foam roll with spine along foam roll and bilateral shoulder flexion, snow angels, scapula retraction and protraction Supine on foam roll with marching Sit on ball and stretch the thoracic spine   12/17/22 Manual: Soft tissue mobilization: To assess for dry needling Manual work to left lumbar paraspinals, left piriformis, left SI joint, left hamstring, left puborectalis, left superior transverse, and left ischiocavernosus Trigger Point Dry-Needling  Treatment instructions: Expect mild to moderate muscle soreness. S/S of pneumothorax if dry needled over a lung field, and to seek immediate medical attention should they occur. Patient verbalized understanding of these instructions and education.   Patient Consent Given: Yes Education handout provided: Previously provided Muscles treated: left lumbar multifidi, left piriformis, along left SI  joint, left puborectalis, left superior transverse, left ischiocavernosus, and left medial hamstring Electrical stimulation performed: No Parameters: N/A Treatment response/outcome: elongation of muscle and trigger point response   Neuromuscular re-education: Core retraining: Hip flexion with red band supine with transverse abdominus contraction 10x each side and preventing popping of the SI joint Bridge  with knees out using red band 15 x Supine with one knee and hip into flexion and red band around hands moving up and down Exercises: Stretches/mobility: Bilateral hamstring stretch holding 30 sec in supine Bilateral piriformis stretch holding for 30 sec. In supine                                                                                                                  PATIENT EDUCATION: Education details: Access Code: [email or web address] Person educated: Patient Education method: Explanation, Demonstration, Tactile cues, Verbal cues, and Handouts Education comprehension: verbalized understanding, returned demonstration, verbal cues required, tactile cues required, and needs further education     HOME EXERCISE PROGRAM: 12/17/22 Access Code: [email or web address] URL: [email or web address] Date: 12/17/2022 Prepared by: Earlie Counts   Program Notes lay on back, put one leg at 90/90, red band around hands, move arms up and down 10x each side.    Exercises - Supine March  -  1 x daily - 3 x weekly - 1 sets - 10 reps - Bridge with Hip Abduction and Resistance  - 1 x daily - 3 x weekly - 1 sets - 10 reps     ASSESSMENT:   CLINICAL IMPRESSION: Patient is a 32 y.o. female who was seen today for physical therapy  treatment for pelvic and perineal pain. No burning pain. Patient does not feel like she has an infection anymore. Her pain is worse with nighttime.  No pain with penile penetration. Patient has increased mobility of the L5-S1 facet.  Patient will benefit from skilled therapy to  improve tissue mobility and strength to reduce pain to make everyday activities easier.    OBJECTIVE IMPAIRMENTS: decreased activity tolerance, decreased coordination, decreased endurance, decreased mobility, decreased ROM, decreased strength, increased fascial restrictions, and pain.    ACTIVITY LIMITATIONS: carrying, lifting, bending, standing, squatting, continence, and caring for others   PARTICIPATION LIMITATIONS: meal prep, cleaning, laundry, shopping, and community activity   PERSONAL FACTORS: Fitness and 1-2 comorbidities: hypothyroidism, recent vaginal birth on 10/12/22  are also affecting patient's functional outcome.    REHAB POTENTIAL: Excellent   CLINICAL DECISION MAKING: Evolving/moderate complexity   EVALUATION COMPLEXITY: Low     GOALS: Goals reviewed with patient? Yes   SHORT TERM GOALS: Target date: 12/26/22   Patient independent with initial HEP for SI mobility.  Baseline:not educated yet Goal status:Met 12/17/22   2.  Patient is able to bulge fully and contract in a circle with the pelvic floor Baseline: unable to contract and hug the therapist finger Goal status: Met 01/16/23   3.  Patient is able to fully contract the upper and lower abdominals to work on stability.  Baseline: Not able to  Goal status: Met 12/17/22     LONG TERM GOALS: Target date: 02/20/2023   Patient independent with advanced HEP for core, pelvic floor and SI stability.  Baseline: not educated yet Goal status: INITIAL   2.  Patient is able to sit for 45 minutes with pain decreased >/= 1-2/10.  Baseline: 5/10 Goal status: INITIAL   3.  Patient able to carry her infant in the carrier with pain level >/= 1-2/10 due to decreased trigger points in the pelvic floor. Baseline: pain level 5/10 Goal status: INITIAL   4.  Patient constant discomfort of feeling she has a UTI has improved >/= 80% due to release of the pelvic floor.  Baseline: Constantly feels she has a UTI  Goal status: Met  01/16/23   5. Patient POPIQ-7 is </= 8 compared  POPIQ-7 19 due to decreased pain and improved SI mobility.  Baseline:  Goal status: INITIAL     PLAN:   PT FREQUENCY: 1x/week   PT DURATION: 12 weeks   PLANNED INTERVENTIONS: Therapeutic exercises, Therapeutic activity, Neuromuscular re-education, Patient/Family education, Joint mobilization, Dry Needling, Spinal mobilization, Taping, Biofeedback, and Manual therapy   PLAN FOR NEXT SESSION:   Core engagement, dry needling or manual work as needed; SI joint stabilization, put in for [employer], PT 01/16/23 9:29 AM

## 2023-01-19 DIAGNOSIS — E039 Hypothyroidism, unspecified: Secondary | ICD-10-CM | POA: Diagnosis not present

## 2023-01-20 LAB — TSH: TSH: 1.3 u[IU]/mL (ref 0.450–4.500)

## 2023-01-20 LAB — T4, FREE: Free T4: 1.23 ng/dL (ref 0.82–1.77)

## 2023-01-20 NOTE — Patient Instructions (Signed)

## 2023-01-22 ENCOUNTER — Ambulatory Visit: Payer: Medicaid Other | Admitting: Nurse Practitioner

## 2023-01-22 ENCOUNTER — Encounter: Payer: Self-pay | Admitting: Nurse Practitioner

## 2023-01-22 VITALS — BP 104/69 | HR 72 | Ht 67.0 in | Wt 156.0 lb

## 2023-01-22 DIAGNOSIS — E039 Hypothyroidism, unspecified: Secondary | ICD-10-CM

## 2023-01-22 MED ORDER — LEVOTHYROXINE SODIUM 75 MCG PO TABS: 75.0000 ug | ORAL_TABLET | Freq: Every day | ORAL | 3 refills | Status: DC

## 2023-01-22 NOTE — Progress Notes (Signed)
Endocrinology Follow Up Note                                         01/22/2023, 3:28 PM    Subjective:   Subjective    Olivia Shepard is a 32 y.o.-year-old female patient being seen in follow up after being seen in consultation for hypothyroidism referred by Martyn Malay, MD.   Past Medical History:  Diagnosis Date   Anemia    Anxiety    GERD (gastroesophageal reflux disease)    History of postpartum hemorrhage, currently pregnant    Hypothyroidism     Past Surgical History:  Procedure Laterality Date   ESOPHAGEAL DILATION      Social History   Socioeconomic History   Marital status: Married    Spouse name: Not on file   Number of children: Not on file   Years of education: Not on file   Highest education level: Not on file  Occupational History   Not on file  Tobacco Use   Smoking status: Never   Smokeless tobacco: Never  Vaping Use   Vaping Use: Never used  Substance and Sexual Activity   Alcohol use: No    Alcohol/week: 0.0 standard drinks of alcohol   Drug use: No   Sexual activity: Yes    Birth control/protection: None  Other Topics Concern   Not on file  Social History Narrative   Works as Marine scientist on L and D at [employer] 4 children, 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   Social Determinants of Health   Financial Resource Strain: Not on file  Food Insecurity: No Food Insecurity (10/11/2022)   Hunger Vital Sign    Worried About Running Out of Food in the Last Year: Never true    Ran Out of Food in the Last Year: Never true  Transportation Needs: No Transportation Needs (10/11/2022)   PRAPARE - Hydrologist (Medical): No    Lack of Transportation (Non-Medical): No  Physical Activity: Not on file  Stress: Not on file  Social Connections: Not on file    Family History  Problem Relation Age of Onset   Diabetes Mother    Varicose Veins Father    Lung cancer  Father    Lung cancer Maternal Grandmother    Varicose Veins Paternal Grandmother    Colon cancer Paternal Great-grandfather    Stomach cancer Paternal Great-grandfather    Colon cancer Paternal Great-grandmother    Esophageal cancer Neg Hx     Outpatient Encounter Medications as of 01/22/2023  Medication Sig   Prenatal Vit-Fe Fumarate-FA (PRENATAL MULTIVITAMIN) TABS tablet Take 1 tablet by mouth daily.   sertraline (ZOLOFT) 25 MG tablet Take 25 mg by mouth daily.   [DISCONTINUED] levothyroxine (SYNTHROID) 75 MCG tablet Take 1 tablet (75 mcg total) by mouth daily before breakfast.   ibuprofen (ADVIL) 600 MG tablet Take 1 tablet (600 mg total) by mouth every 6 (six) hours. (Patient not taking:  Reported on 01/22/2023)   levothyroxine (SYNTHROID) 75 MCG tablet Take 1 tablet (75 mcg total) by mouth daily before breakfast.   lidocaine-prilocaine (EMLA) cream Apply 1 application. topically as needed (for urethral irritation). (Patient not taking: Reported on 11/05/2022)   pantoprazole (PROTONIX) 40 MG tablet Take 40 mg by mouth daily. (Patient not taking: Reported on 01/22/2023)   No facility-administered encounter medications on file as of 01/22/2023.    ALLERGIES: No Known Allergies VACCINATION STATUS: Immunization History  Administered Date(s) Administered   DTaP 11/10/1989   Hepatitis B 08/06/2000, 01/28/2001, 10/27/2005   Hepatitis B, PED/ADOLESCENT 08/06/2000, 08/06/2000, 01/28/2001, 01/28/2001, 10/27/2005, 10/27/2005   Influenza Split 07/28/2012   Influenza, Seasonal, Injecte, Preservative Fre 07/28/2012, 08/03/2013, 08/11/2015, 08/18/2017, 08/27/2018   Influenza,inj,Quad PF,6+ Mos 08/27/2018, 06/27/2019, 08/02/2021   Influenza,inj,Quad PF,6-35 Mos 06/27/2019   Influenza,inj,quad, With Preservative 07/28/2012, 08/03/2013, 08/11/2015, 08/18/2017, 08/27/2018   Influenza-Unspecified 07/28/2012, 08/03/2013, 08/11/2015, 08/18/2017, 06/27/2019   MMR 02/17/1991, 02/09/1995, 09/18/2016,  10/13/2022   Meningococcal Conjugate 06/27/2008   Meningococcal polysaccharide vaccine (MPSV4) 06/27/2008   PFIZER Comirnaty(Gray Top)Covid-19 Tri-Sucrose Vaccine 12/25/2020   PFIZER(Purple Top)SARS-COV-2 Vaccination 06/20/2020, 07/11/2020   PPD Test 06/14/2008, 02/03/2011, 02/13/2012   Td 04/25/2005   Tdap 02/09/1995, 08/10/2014, 08/29/2016, 04/30/2020     HPI   Olivia Shepard  is a patient with the above medical history. she was diagnosed with hypothyroidism a few years ago as she and her husband were trying to conceive a child.  She was started on Levothyroxine 25 mcg po daily as a result.  She has been tested for thyroid antibodies several times, negative.  She recently delivered a healthy baby.  I reviewed patient's thyroid tests:  Lab Results  Component Value Date   TSH 1.300 01/19/2023   TSH 0.758 10/29/2022   TSH 1.700 09/04/2022   TSH 1.320 07/18/2022   TSH 2.380 05/27/2022   TSH 2.200 04/14/2022   TSH 2.47 03/20/2022   TSH 2.29 03/03/2022   TSH 2.480 08/02/2021   TSH 1.810 07/10/2020   FREET4 1.23 01/19/2023   FREET4 1.55 10/29/2022   FREET4 1.11 09/04/2022   FREET4 1.19 07/18/2022   FREET4 1.27 05/27/2022   FREET4 1.18 04/14/2022   FREET4 0.98 08/31/2015     Pt denies feeling nodules in neck, hoarseness, dysphagia/odynophagia, SOB with lying down.  she does have family history of thyroid disorders in her mom (recently diagnosed).  No family history of thyroid cancer. No history of radiation therapy to head or neck.  No recent use of iodine supplements.  Denies use of Biotin containing supplements.  I reviewed her chart and she also has a history of GERD (had to have esophagus stretched), anemia, anxiety.   Review of systems  Constitutional: + Minimally fluctuating body weight,  current Body mass index is 24.43 kg/m. , no fatigue, no subjective hyperthermia, no subjective hypothermia Eyes: no blurry vision, no xerophthalmia ENT: no sore throat, no nodules  palpated in throat, no dysphagia/odynophagia, no hoarseness Cardiovascular: no chest pain, no shortness of breath, no palpitations, no leg swelling Respiratory: no cough, no shortness of breath Gastrointestinal: no nausea/vomiting/diarrhea Musculoskeletal: no muscle/joint aches Skin: no rashes, no hyperemia Neurological: no tremors, no numbness, no tingling, no dizziness Psychiatric: no depression, no anxiety   Objective:   Objective     BP 104/69 (BP Location: Right Arm, Patient Position: Sitting, Cuff Size: Normal)   Pulse 72   Ht '5\' 7"'$  (1.702 m)   Wt 156 lb (70.8 kg)   BMI 24.43 kg/m  Wt Readings  from Last 3 Encounters:  01/22/23 156 lb (70.8 kg)  11/05/22 160 lb (72.6 kg)  10/11/22 175 lb 9.6 oz (79.7 kg)    BP Readings from Last 3 Encounters:  01/22/23 104/69  10/13/22 93/60  10/06/22 113/79      Physical Exam- Limited  Constitutional:  Body mass index is 24.43 kg/m. , not in acute distress, normal state of mind Eyes:  EOMI, no exophthalmos Neck: Supple Musculoskeletal: no gross deformities, strength intact in all four extremities, no gross restriction of joint movements Skin:  no rashes, no hyperemia Neurological: no tremor with outstretched hands   CMP ( most recent) CMP     Component Value Date/Time   NA 143 08/02/2021 0937   K 5.1 08/02/2021 0937   CL 103 08/02/2021 0937   CO2 26 08/02/2021 0937   GLUCOSE 68 08/02/2021 0937   GLUCOSE 71 12/27/2015 1501   BUN 18 08/02/2021 0937   CREATININE 0.67 08/02/2021 0937   CREATININE 0.55 12/27/2015 1501   CALCIUM 10.0 08/02/2021 0937   PROT 7.1 08/02/2021 0937   ALBUMIN 4.8 08/02/2021 0937   AST 11 08/02/2021 0937   ALT 12 08/02/2021 0937   ALKPHOS 62 08/02/2021 0937   BILITOT 0.3 08/02/2021 0937   GFRNONAA 109 08/27/2018 1021   GFRNONAA >89 12/27/2015 1501   GFRAA 125 08/27/2018 1021   GFRAA >89 12/27/2015 1501     Diabetic Labs (most recent): No results found for: "HGBA1C", "MICROALBUR"    Lipid Panel ( most recent) Lipid Panel     Component Value Date/Time   CHOL 144 12/27/2015 1501   TRIG 32 12/27/2015 1501   HDL 83 12/27/2015 1501   CHOLHDL 1.7 12/27/2015 1501   VLDL 6 12/27/2015 1501   LDLCALC 55 12/27/2015 1501       Lab Results  Component Value Date   TSH 1.300 01/19/2023   TSH 0.758 10/29/2022   TSH 1.700 09/04/2022   TSH 1.320 07/18/2022   TSH 2.380 05/27/2022   TSH 2.200 04/14/2022   TSH 2.47 03/20/2022   TSH 2.29 03/03/2022   TSH 2.480 08/02/2021   TSH 1.810 07/10/2020   FREET4 1.23 01/19/2023   FREET4 1.55 10/29/2022   FREET4 1.11 09/04/2022   FREET4 1.19 07/18/2022   FREET4 1.27 05/27/2022   FREET4 1.18 04/14/2022   FREET4 0.98 08/31/2015     Latest Reference Range & Units 05/27/22 14:33 07/18/22 16:05 09/04/22 14:15 10/29/22 11:06 01/19/23 16:20  TSH 0.450 - 4.500 uIU/mL 2.380 1.320 1.700 0.758 1.300  T4,Free(Direct) 0.82 - 1.77 ng/dL 1.27 1.19 1.11 1.55 1.23    Assessment & Plan:   ASSESSMENT / PLAN:  1. Hypothyroidism-acquired  Patient with long-standing hypothyroidism, on levothyroxine therapy. On physical exam, patient does not have gross goiter, thyroid nodules, or neck compression symptoms.  Her antibody testing was negative, ruling out autoimmune thyroid dysfunction.     Her previsit thyroid function tests are consistent with appropriate hormone replacement.  She is advised to continue Levothyroxine 75 mcg po daily before breakfast.  - We discussed about correct intake of levothyroxine, at fasting, with water, separated by at least 30 minutes from breakfast, and separated by more than 4 hours from calcium, iron, multivitamins, acid reflux medications (PPIs). -Patient is made aware of the fact that thyroid hormone replacement is needed for life, dose to be adjusted by periodic monitoring of thyroid function tests.   -Due to absence of clinical goiter, no need for thyroid ultrasound at this time.  I spent  20  minutes in  the care of the patient today including review of labs from Thyroid Function, CMP, and other relevant labs ; imaging/biopsy records (current and previous including abstractions from other facilities); face-to-face time discussing  her lab results and symptoms, medications doses, her options of short and long term treatment based on the latest standards of care / guidelines;   and documenting the encounter.  Olivia Shepard  participated in the discussions, expressed understanding, and voiced agreement with the above plans.  All questions were answered to her satisfaction. she is encouraged to contact clinic should she have any questions or concerns prior to her return visit.   FOLLOW UP PLAN:  Return in about 4 months (around 05/24/2023) for Thyroid follow up, Previsit labs.  Rayetta Pigg, [facility] [address] [place], [place] [address] Phone: [phone] Fax: [phone]  01/22/2023, 3:28 PM

## 2023-01-23 ENCOUNTER — Ambulatory Visit: Payer: Medicaid Other | Admitting: Physical Therapy

## 2023-01-23 ENCOUNTER — Encounter: Payer: Self-pay | Admitting: Physical Therapy

## 2023-01-23 DIAGNOSIS — M6281 Muscle weakness (generalized): Secondary | ICD-10-CM | POA: Diagnosis not present

## 2023-01-23 DIAGNOSIS — R102 Pelvic and perineal pain: Secondary | ICD-10-CM

## 2023-01-23 NOTE — Therapy (Signed)
OUTPATIENT PHYSICAL THERAPY TREATMENT NOTE   Patient Name: Olivia Shepard MRN: [id number] DOB:August 24, 1991, 32 y.o., female Today's Date: 01/23/2023  PCP: Martyn Malay, MD  REFERRING PROVIDER: Tona Sensing, FNP   END OF SESSION:   PT End of Session - 01/23/23 0953     Visit Number 7    Date for PT Re-Evaluation 02/20/23    Authorization Type [employer]    Authorization Time Period 1/19-3/18    Authorization - Visit Number 6    Authorization - Number of Visits 6    PT Start Time 0800    PT Stop Time 0840    PT Time Calculation (min) 40 min    Activity Tolerance Patient tolerated treatment well    Behavior During Therapy WFL for tasks assessed/performed             Past Medical History:  Diagnosis Date   Anemia    Anxiety    GERD (gastroesophageal reflux disease)    History of postpartum hemorrhage, currently pregnant    Hypothyroidism    Past Surgical History:  Procedure Laterality Date   ESOPHAGEAL DILATION     Patient Active Problem List   Diagnosis Date Noted   Precipitous delivery, delivered (current hospitalization) 10/12/2022   Indication for care in labor and delivery, antepartum 10/11/2022   Rash 07/31/2022   Other intervertebral disc degeneration, lumbar region 02/09/2022   Fatigue 08/02/2021   Dysphagia 11/14/2020   Pelvic floor dysfunction 10/02/2020   Varicose veins of legs, antepartum 12/19/2019   Plantar wart, right foot 03/18/2018   GERD (gastroesophageal reflux disease) 07/25/2016   Tinea pedis of both feet 07/25/2016   Hypothyroidism 07/19/2015   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   Chronic UTI 10/24/2013   IBS (irritable bowel syndrome) 06/29/2012   REFERRING DIAG: R10.2 (ICD-10-CM) - Pelvic and perineal pain    THERAPY DIAG:  Muscle weakness (generalized)   Pelvic pain   Rationale for Evaluation and Treatment: Rehabilitation   ONSET DATE: 2/23   SUBJECTIVE:                                                                                                                                                                                             SUBJECTIVE STATEMENT: The burning is better. I have left hip and lumbar pain. The pain is worse at night.      PAIN:  Are you having pain? Yes NPRS scale: 2/10 during the day, 7/10 night Pain location:  left SI joint and gluteus medius, right interscapular area   Pain type: aching  Pain description: constant    Aggravating factors:  being active, sleeping Relieving factors: massage, stretches   PRECAUTIONS: None   WEIGHT BEARING RESTRICTIONS: No   FALLS:  Has patient fallen in last 6 months? No   LIVING ENVIRONMENT: Lives with: lives with their family   OCCUPATION: nurse   PLOF: Independent   PATIENT GOALS: reduce pain and balance muscles and reduce diastasis   PERTINENT HISTORY:  hypothyroidism   BOWEL MOVEMENT: Pain with bowel movement: Yes, with hemorrhoids Type of bowel movement:Type (Bristol Stool Scale) type 4 and Strain No Fully empty rectum: Yes:     URINATION: Pain with urination: No,  Fully empty bladder: Yes:   Stream: Strong Urgency: No Frequency: average  Leakage: no Pads: No   INTERCOURSE:  Pain with intercourse: none Ability to have vaginal penetration:  Yes:   Marinoff Scale: 0/3   PREGNANCY: Vaginal deliveries 5 Tearing No   PROLAPSE: Initial had some but now not     OBJECTIVE:    PATIENT SURVEYS:  POPIQ-7 19 01/23/23 POPIQ-7 19   COGNITION: Overall cognitive status: Within functional limits for tasks assessed                          SENSATION: Light touch: Appears intact Proprioception: Appears intact     LUMBAR SPECIAL TESTS:  SI Compression/distraction test: Negative left        POSTURE: No Significant postural limitations   PELVIC ALIGNMENT: left ilium is posteriorly rotated   LUMBARAROM/PROM:   A/PROM A/PROM  eval 01/23/23  Flexion Full but increased muscle mass on the left Full;  right ASIS stays higher  Extension Decreased by 25% from L1-L5, pressure in left hip    Right lateral flexion Decreased by 25% Decreased by 25%; tightness in left L/S region  Left lateral flexion full full  Right rotation Decreased by 25% Decreased by 25%  Left rotation Decreased by 25% full   (Blank rows = not tested)   LOWER EXTREMITY ROM: ER decreased by 25% bil.    LOWER EXTREMITY MMT:   MMT Right eval Left eval right 01/23/23 Left 01/23/23  Hip extension 4/5 4/5 5/5 4/5  Hip abduction 4/5 4/5 4/5 5/5  Hip adduction 4/5 4/5 5/5 5/5    PALPATION:   General  weak abdominal contraction                 External Perineal Exam good coloring                             Internal Pelvic Floor reduction of tenderness Patient confirms identification and approves PT to assess internal pelvic floor and treatment Yes   PELVIC MMT:   MMT eval 12/08/22  Vaginal 2/5 hold 5 sec 2/5  Diastasis Recti 1 finger width    (Blank rows = not tested)         TONE: good   PROLAPSE: none   TODAY'S TREATMENT:   01/23/23 Manual: Soft tissue mobilization: To assess for dry needling Manual work to left lumbar paraspinals, left gluteals, piriformis Spinal mobilization: Gapping of the left L3-S1 facets in sidely and sitting Trigger Point Dry-Needling  Treatment instructions: Expect mild to moderate muscle soreness. S/S of pneumothorax if dry needled over a lung field, and to seek immediate medical attention should they occur. Patient verbalized understanding of these instructions and education.  Patient Consent Given: Yes Education handout provided: Previously provided Muscles treated: left lumbar multifidi, left gluteus medius,  gluteus maximus, gluteus minimus, piriformis Electrical stimulation performed: Yes Parameters:  to patient tolerance Treatment response/outcome: elongation of muscle and trigger point response   01/16/23 Manual: Soft tissue mobilization: To assess for dry  needling Manual work to left gluteal, posterior left hip, left piriformis and left hip rotators, right thoracolumbar iliocostals Spinal mobilization: Gapping of the left L5-S1 facet Internal pelvic floor techniques: Trigger Point Dry-Needling  Treatment instructions: Expect mild to moderate muscle soreness. S/S of pneumothorax if dry needled over a lung field, and to seek immediate medical attention should they occur. Patient verbalized understanding of these instructions and education.   Patient Consent Given: Yes Education handout provided: Previously provided Muscles treated: left gluteus medius and maximus; Gemlius, piriformis, right thoracolumbar coastalis Electrical stimulation performed: No Parameters: N/A Treatment response/outcome: elongation of muscle and trigger point response Exercises: Stretches/mobility: Standing with increased weight on right  leg and reach left hand to right foot to stretch right SI joint   12/31/22 Manual: Soft tissue mobilization:  To assess for dry needling Manual work to bilateral thoracic paraspinals Spinal mobilization: PA and rotational mobilization to T3-T10 and posterior rib mobilization Internal pelvic floor techniques: Trigger Point Dry-Needling  Treatment instructions: Expect mild to moderate muscle soreness. S/S of pneumothorax if dry needled over a lung field, and to seek immediate medical attention should they occur. Patient verbalized understanding of these instructions and education.   Patient Consent Given: Yes Education handout provided: Previously provided Muscles treated: thoracic paraspinalis Electrical stimulation performed: No Parameters: N/A Treatment response/outcome: elongation of muscles and trigger point response   Exercises: Stretches/mobility: Supine with foam roll horizontal to the spine and roll on it to increase thoracic extension Childs pose with arms on foam roll and working on thoracic  rotation Strengthening: Supine on foam roll with spine along foam roll and bilateral shoulder flexion, snow angels, scapula retraction and protraction Supine on foam roll with marching Sit on ball and stretch the thoracic spine                                                                                                                PATIENT EDUCATION: Education details: Access Code: [email or web address] Person educated: Patient Education method: Explanation, Demonstration, Tactile cues, Verbal cues, and Handouts Education comprehension: verbalized understanding, returned demonstration, verbal cues required, tactile cues required, and needs further education     HOME EXERCISE PROGRAM: 12/17/22 Access Code: [email or web address] URL: [email or web address] Date: 12/17/2022 Prepared by: Earlie Counts   Program Notes lay on back, put one leg at 90/90, red band around hands, move arms up and down 10x each side.    Exercises - Supine March  - 1 x daily - 3 x weekly - 1 sets - 10 reps - Bridge with Hip Abduction and Resistance  - 1 x daily - 3 x weekly - 1 sets - 10 reps     ASSESSMENT:   CLINICAL IMPRESSION: Patient is a 32 y.o. female who was seen today for physical therapy  treatment for pelvic and perineal pain. Sit with  pain level 2/10.  Pain level 2/10 in left SI joint when carrying her infant. She has limitation of right sidebending and rotation due to left facets are restricted on the lumbar area. She has increased strength of her hips. Patient POPI-7 score is 19 due to difficulty with sitting and being frustrated with the discomfort. Patient will benefit from skilled therapy to improve tissue mobility and strength to reduce pain to make everyday activities easier.    OBJECTIVE IMPAIRMENTS: decreased activity tolerance, decreased coordination, decreased endurance, decreased mobility, decreased ROM, decreased strength, increased fascial restrictions, and pain.    ACTIVITY LIMITATIONS:  carrying, lifting, bending, standing, squatting, continence, and caring for others   PARTICIPATION LIMITATIONS: meal prep, cleaning, laundry, shopping, and community activity   PERSONAL FACTORS: Fitness and 1-2 comorbidities: hypothyroidism, recent vaginal birth on 10/12/22  are also affecting patient's functional outcome.    REHAB POTENTIAL: Excellent   CLINICAL DECISION MAKING: Evolving/moderate complexity   EVALUATION COMPLEXITY: Low     GOALS: Goals reviewed with patient? Yes   SHORT TERM GOALS: Target date: 12/26/22   Patient independent with initial HEP for SI mobility.  Baseline:not educated yet Goal status:Met 12/17/22   2.  Patient is able to bulge fully and contract in a circle with the pelvic floor Baseline: unable to contract and hug the therapist finger Goal status: Met 01/16/23   3.  Patient is able to fully contract the upper and lower abdominals to work on stability.  Baseline: Not able to  Goal status: Met 12/17/22     LONG TERM GOALS: Target date: 02/20/2023   Patient independent with advanced HEP for core, pelvic floor and SI stability.  Baseline: not educated yet Goal status: ongoing 01/23/23   2.  Patient is able to sit for 45 minutes with pain decreased >/= 1-2/10.  Baseline: 2/10 Goal status: ongoing 01/23/23   3.  Patient able to carry her infant in the carrier with pain level >/= 1-2/10 due to decreased trigger points in the pelvic floor. Baseline: pain level 2/10 in left SI joint Goal status: Met 01/23/23   4.  Patient constant discomfort of feeling she has a UTI has improved >/= 80% due to release of the pelvic floor.  Baseline: Constantly feels she has a UTI  Goal status: Met 01/16/23   5. Patient POPIQ-7 is </= 8 compared  POPIQ-7 19 due to decreased pain and improved SI mobility.  Baseline: POPIQ-7 24 Goal status: ongoing 01/23/23     PLAN:   PT FREQUENCY: 1x/week   PT DURATION: 12 weeks   PLANNED INTERVENTIONS: Therapeutic exercises,  Therapeutic activity, Neuromuscular re-education, Patient/Family education, Joint mobilization, Dry Needling, Spinal mobilization, Taping, Biofeedback, and Manual therapy   PLAN FOR NEXT SESSION:   Core engagement, dry needling or manual work as needed; SI joint stabilization,  Earlie Counts, PT 01/23/23 10:01 AM

## 2023-01-28 ENCOUNTER — Encounter: Payer: Self-pay | Admitting: Physical Therapy

## 2023-01-28 ENCOUNTER — Ambulatory Visit: Payer: Medicaid Other | Admitting: Physical Therapy

## 2023-01-28 DIAGNOSIS — M6281 Muscle weakness (generalized): Secondary | ICD-10-CM | POA: Diagnosis not present

## 2023-01-28 DIAGNOSIS — R102 Pelvic and perineal pain unspecified side: Secondary | ICD-10-CM

## 2023-01-28 NOTE — Therapy (Signed)
OUTPATIENT PHYSICAL THERAPY TREATMENT NOTE   Patient Name: Olivia Shepard MRN: [id number] DOB:01-Oct-1991, 32 y.o., female Today's Date: 01/28/2023  PCP: Martyn Malay, MD  REFERRING PROVIDER: Tona Sensing, FNP   END OF SESSION:   PT End of Session - 01/28/23 1616     Visit Number 8    Date for PT Re-Evaluation 02/20/23    Authorization Type [employer]    Authorization Time Period 01/23/2023-03/23/2023    Authorization - Visit Number 1    Authorization - Number of Visits 6    PT Start Time [id number]    PT Stop Time 1655    PT Time Calculation (min) 40 min    Activity Tolerance Patient tolerated treatment well    Behavior During Therapy WFL for tasks assessed/performed             Past Medical History:  Diagnosis Date   Anemia    Anxiety    GERD (gastroesophageal reflux disease)    History of postpartum hemorrhage, currently pregnant    Hypothyroidism    Past Surgical History:  Procedure Laterality Date   ESOPHAGEAL DILATION     Patient Active Problem List   Diagnosis Date Noted   Precipitous delivery, delivered (current hospitalization) 10/12/2022   Indication for care in labor and delivery, antepartum 10/11/2022   Rash 07/31/2022   Other intervertebral disc degeneration, lumbar region 02/09/2022   Fatigue 08/02/2021   Dysphagia 11/14/2020   Pelvic floor dysfunction 10/02/2020   Varicose veins of legs, antepartum 12/19/2019   Plantar wart, right foot 03/18/2018   GERD (gastroesophageal reflux disease) 07/25/2016   Tinea pedis of both feet 07/25/2016   Hypothyroidism 07/19/2015   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   Chronic UTI 10/24/2013   IBS (irritable bowel syndrome) 06/29/2012   REFERRING DIAG: R10.2 (ICD-10-CM) - Pelvic and perineal pain    THERAPY DIAG:  Muscle weakness (generalized)   Pelvic pain   Rationale for Evaluation and Treatment: Rehabilitation   ONSET DATE: 2/23   SUBJECTIVE:                                                                                                                                                                                             SUBJECTIVE STATEMENT: The burning is better. I have left hip and lumbar pain. The pain is worse at night.      PAIN:  Are you having pain? Yes NPRS scale: 2/10 during the day, 7/10 night Pain location:  left SI joint and gluteus medius, right interscapular area   Pain type: aching  Pain description: constant    Aggravating factors:  being active, sleeping Relieving factors: massage, stretches   PRECAUTIONS: None   WEIGHT BEARING RESTRICTIONS: No   FALLS:  Has patient fallen in last 6 months? No   LIVING ENVIRONMENT: Lives with: lives with their family   OCCUPATION: nurse   PLOF: Independent   PATIENT GOALS: reduce pain and balance muscles and reduce diastasis   PERTINENT HISTORY:  hypothyroidism   BOWEL MOVEMENT: Pain with bowel movement: Yes, with hemorrhoids Type of bowel movement:Type (Bristol Stool Scale) type 4 and Strain No Fully empty rectum: Yes:     URINATION: Pain with urination: No,  Fully empty bladder: Yes:   Stream: Strong Urgency: No Frequency: average  Leakage: no Pads: No   INTERCOURSE:  Pain with intercourse: none Ability to have vaginal penetration:  Yes:   Marinoff Scale: 0/3   PREGNANCY: Vaginal deliveries 5 Tearing No   PROLAPSE: Initial had some but now not     OBJECTIVE:    PATIENT SURVEYS:  POPIQ-7 19 01/23/23 POPIQ-7 19   COGNITION: Overall cognitive status: Within functional limits for tasks assessed                          SENSATION: Light touch: Appears intact Proprioception: Appears intact     LUMBAR SPECIAL TESTS:  SI Compression/distraction test: Negative left        POSTURE: No Significant postural limitations   PELVIC ALIGNMENT: left ilium is posteriorly rotated   LUMBARAROM/PROM:   A/PROM A/PROM  eval 01/23/23  Flexion Full but increased muscle mass on the left  Full; right ASIS stays higher  Extension Decreased by 25% from L1-L5, pressure in left hip     Right lateral flexion Decreased by 25% Decreased by 25%; tightness in left L/S region  Left lateral flexion full full  Right rotation Decreased by 25% Decreased by 25%  Left rotation Decreased by 25% full   (Blank rows = not tested)   LOWER EXTREMITY ROM: ER decreased by 25% bil.    LOWER EXTREMITY MMT:   MMT Right eval Left eval right 01/23/23 Left 01/23/23  Hip extension 4/5 4/5 5/5 4/5  Hip abduction 4/5 4/5 4/5 5/5  Hip adduction 4/5 4/5 5/5 5/5    PALPATION:   General  weak abdominal contraction                 External Perineal Exam good coloring                             Internal Pelvic Floor reduction of tenderness Patient confirms identification and approves PT to assess internal pelvic floor and treatment Yes   PELVIC MMT:   MMT eval 12/08/22  Vaginal 2/5 hold 5 sec 2/5  Diastasis Recti 1 finger width    (Blank rows = not tested)         TONE: good   PROLAPSE: none   TODAY'S TREATMENT:   01/28/23 Manual: Soft tissue mobilization: To assess for dry needling Manual work to the right quadratus, along the right interscapular area and along the right thoracolumbar area to elongate after dry needling Spinal mobilization: Gapping of the right L5-S1 facet in sidely Trigger Point Dry-Needling  Treatment instructions: Expect mild to moderate muscle soreness. S/S of pneumothorax if dry needled over a lung field, and to seek immediate medical attention should they occur. Patient verbalized understanding of these instructions and education. Patient Consent Given: Yes Education handout  provided: Previously provided Muscles treated: right Quadratus, right thoracolumbar longus, right levator scapulae Electrical stimulation performed: No Parameters: N/A Treatment response/outcome: elongation of muscle and trigger point response  Exercises: Strengthening: One legged  bridge 10x each Dead lift with 10# kettle bell Lung onto 6 inch step 15x on each side One legged leg dead lift 15x each leg    01-31-23 Manual: Soft tissue mobilization: To assess for dry needling Manual work to left lumbar paraspinals, left gluteals, piriformis Spinal mobilization: Gapping of the left L3-S1 facets in sidely and sitting Trigger Point Dry-Needling  Treatment instructions: Expect mild to moderate muscle soreness. S/S of pneumothorax if dry needled over a lung field, and to seek immediate medical attention should they occur. Patient verbalized understanding of these instructions and education.   Patient Consent Given: Yes Education handout provided: Previously provided Muscles treated: left lumbar multifidi, left gluteus medius, gluteus maximus, gluteus minimus, piriformis Electrical stimulation performed: Yes Parameters:  to patient tolerance Treatment response/outcome: elongation of muscle and trigger point response   01/16/23 Manual: Soft tissue mobilization: To assess for dry needling Manual work to left gluteal, posterior left hip, left piriformis and left hip rotators, right thoracolumbar iliocostals Spinal mobilization: Gapping of the left L5-S1 facet Internal pelvic floor techniques: Trigger Point Dry-Needling  Treatment instructions: Expect mild to moderate muscle soreness. S/S of pneumothorax if dry needled over a lung field, and to seek immediate medical attention should they occur. Patient verbalized understanding of these instructions and education.   Patient Consent Given: Yes Education handout provided: Previously provided Muscles treated: left gluteus medius and maximus; Gemlius, piriformis, right thoracolumbar coastalis Electrical stimulation performed: No Parameters: N/A Treatment response/outcome: elongation of muscle and trigger point response Exercises: Stretches/mobility: Standing with increased weight on right  leg and reach left hand to  right foot to stretch right SI joint                                                                                                               PATIENT EDUCATION: Education details: Access Code: [email or web address] Person educated: Patient Education method: Explanation, Demonstration, Tactile cues, Verbal cues, and Handouts Education comprehension: verbalized understanding, returned demonstration, verbal cues required, tactile cues required, and needs further education     HOME EXERCISE PROGRAM: 12/17/22 Access Code: [email or web address] URL: [email or web address] Date: 12/17/2022 Prepared by: Earlie Counts   Program Notes lay on back, put one leg at 90/90, red band around hands, move arms up and down 10x each side.    Exercises - Supine March  - 1 x daily - 3 x weekly - 1 sets - 10 reps - Bridge with Hip Abduction and Resistance  - 1 x daily - 3 x weekly - 1 sets - 10 reps     ASSESSMENT:   CLINICAL IMPRESSION: Patient is a 32 y.o. female who was seen today for physical therapy  treatment for pelvic and perineal pain. Patient is having less tightness in the hip. She is now strengthening her SI joint to improve stability with single leg stance.  Pelvis was in correct alignment. She had to symptom of a UTI during her 12 hour shift at work.  Patient will benefit from skilled therapy to improve tissue mobility and strength to reduce pain to make everyday activities easier.    OBJECTIVE IMPAIRMENTS: decreased activity tolerance, decreased coordination, decreased endurance, decreased mobility, decreased ROM, decreased strength, increased fascial restrictions, and pain.    ACTIVITY LIMITATIONS: carrying, lifting, bending, standing, squatting, continence, and caring for others   PARTICIPATION LIMITATIONS: meal prep, cleaning, laundry, shopping, and community activity   PERSONAL FACTORS: Fitness and 1-2 comorbidities: hypothyroidism, recent vaginal birth on 10/12/22  are also affecting patient's  functional outcome.    REHAB POTENTIAL: Excellent   CLINICAL DECISION MAKING: Evolving/moderate complexity   EVALUATION COMPLEXITY: Low     GOALS: Goals reviewed with patient? Yes   SHORT TERM GOALS: Target date: 12/26/22   Patient independent with initial HEP for SI mobility.  Baseline:not educated yet Goal status:Met 12/17/22   2.  Patient is able to bulge fully and contract in a circle with the pelvic floor Baseline: unable to contract and hug the therapist finger Goal status: Met 01/16/23   3.  Patient is able to fully contract the upper and lower abdominals to work on stability.  Baseline: Not able to  Goal status: Met 12/17/22     LONG TERM GOALS: Target date: 02/20/2023   Patient independent with advanced HEP for core, pelvic floor and SI stability.  Baseline: not educated yet Goal status: ongoing 01/23/23   2.  Patient is able to sit for 45 minutes with pain decreased >/= 1-2/10.  Baseline: 2/10 Goal status: ongoing 01/23/23   3.  Patient able to carry her infant in the carrier with pain level >/= 1-2/10 due to decreased trigger points in the pelvic floor. Baseline: pain level 2/10 in left SI joint Goal status: Met 01/23/23   4.  Patient constant discomfort of feeling she has a UTI has improved >/= 80% due to release of the pelvic floor.  Baseline: Constantly feels she has a UTI  Goal status: Met 01/16/23   5. Patient POPIQ-7 is </= 8 compared  POPIQ-7 19 due to decreased pain and improved SI mobility.  Baseline: POPIQ-7 24 Goal status: ongoing 01/23/23     PLAN:   PT FREQUENCY: 1x/week   PT DURATION: 12 weeks   PLANNED INTERVENTIONS: Therapeutic exercises, Therapeutic activity, Neuromuscular re-education, Patient/Family education, Joint mobilization, Dry Needling, Spinal mobilization, Taping, Biofeedback, and Manual therapy   PLAN FOR NEXT SESSION:   Core engagement, dry needling or manual work as needed; SI joint stabilization,    Earlie Counts, PT 01/28/23  5:02 PM

## 2023-02-16 ENCOUNTER — Ambulatory Visit: Payer: Medicaid Other | Attending: Obstetrics and Gynecology | Admitting: Physical Therapy

## 2023-02-16 ENCOUNTER — Encounter: Payer: Self-pay | Admitting: Physical Therapy

## 2023-02-16 DIAGNOSIS — M6281 Muscle weakness (generalized): Secondary | ICD-10-CM | POA: Diagnosis not present

## 2023-02-16 DIAGNOSIS — R102 Pelvic and perineal pain: Secondary | ICD-10-CM | POA: Insufficient documentation

## 2023-02-16 NOTE — Therapy (Signed)
OUTPATIENT PHYSICAL THERAPY TREATMENT NOTE   Patient Name: Olivia Shepard MRN: [id number] DOB:1991/04/24, 32 y.o., female Today's Date: 02/16/2023  PCP: Westley Chandler, MD  REFERRING PROVIDER: Arby Barrette, FNP   END OF SESSION:   PT End of Session - 02/16/23 0803     Visit Number 9    Date for PT Re-Evaluation 02/20/23    Authorization Type [employer]    Authorization Time Period 01/23/2023-03/23/2023    Authorization - Visit Number 2    Authorization - Number of Visits 6    PT Start Time 0800    PT Stop Time 0840    PT Time Calculation (min) 40 min    Activity Tolerance Patient tolerated treatment well    Behavior During Therapy WFL for tasks assessed/performed             Past Medical History:  Diagnosis Date   Anemia    Anxiety    GERD (gastroesophageal reflux disease)    History of postpartum hemorrhage, currently pregnant    Hypothyroidism    Past Surgical History:  Procedure Laterality Date   ESOPHAGEAL DILATION     Patient Active Problem List   Diagnosis Date Noted   Precipitous delivery, delivered (current hospitalization) 10/12/2022   Indication for care in labor and delivery, antepartum 10/11/2022   Rash 07/31/2022   Other intervertebral disc degeneration, lumbar region 02/09/2022   Fatigue 08/02/2021   Dysphagia 11/14/2020   Pelvic floor dysfunction 10/02/2020   Varicose veins of legs, antepartum 12/19/2019   Plantar wart, right foot 03/18/2018   GERD (gastroesophageal reflux disease) 07/25/2016   Tinea pedis of both feet 07/25/2016   Hypothyroidism 07/19/2015   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   Chronic UTI 10/24/2013   IBS (irritable bowel syndrome) 06/29/2012   REFERRING DIAG: R10.2 (ICD-10-CM) - Pelvic and perineal pain    THERAPY DIAG:  Muscle weakness (generalized)   Pelvic pain   Rationale for Evaluation and Treatment: Rehabilitation   ONSET DATE: 2/23   SUBJECTIVE:                                                                                                                                                                                             SUBJECTIVE STATEMENT: I have slept on a hard bed and stirred up my SI joint. I woul dlike today to be my last day.       PAIN:  Are you having pain? Yes NPRS scale: 4/10 during the day Pain location:  left SI joint and gluteus medius, right interscapular area   Pain type: aching  Pain description: constant  Aggravating factors: being active, sleeping Relieving factors: massage, stretches   PRECAUTIONS: None   WEIGHT BEARING RESTRICTIONS: No   FALLS:  Has patient fallen in last 6 months? No   LIVING ENVIRONMENT: Lives with: lives with their family   OCCUPATION: nurse   PLOF: Independent   PATIENT GOALS: reduce pain and balance muscles and reduce diastasis   PERTINENT HISTORY:  hypothyroidism   BOWEL MOVEMENT: Pain with bowel movement: Yes, with hemorrhoids Type of bowel movement:Type (Bristol Stool Scale) type 4 and Strain No Fully empty rectum: Yes:     URINATION: Pain with urination: No,  Fully empty bladder: Yes:   Stream: Strong Urgency: No Frequency: average  Leakage: no Pads: No   INTERCOURSE:  Pain with intercourse: none Ability to have vaginal penetration:  Yes:   Marinoff Scale: 0/3   PREGNANCY: Vaginal deliveries 5 Tearing No   PROLAPSE: Initial had some but now not     OBJECTIVE:    PATIENT SURVEYS:  POPIQ-7 19 01/23/23 POPIQ-7 19 02/16/23 POPIQ-7 19   COGNITION: Overall cognitive status: Within functional limits for tasks assessed                          SENSATION: Light touch: Appears intact Proprioception: Appears intact     LUMBAR SPECIAL TESTS:  SI Compression/distraction test: Negative left        POSTURE: No Significant postural limitations   PELVIC ALIGNMENT: left ilium is posteriorly rotated 02/16/23 left ilium is rotated posteriorly   LUMBARAROM/PROM:   A/PROM A/PROM  eval  01/23/23 02/16/23  Flexion Full but increased muscle mass on the left Full; right ASIS stays higher full  Extension Decreased by 25% from L1-L5, pressure in left hip    full  Right lateral flexion Decreased by 25% Decreased by 25%; tightness in left L/S region full  Left lateral flexion full full full  Right rotation Decreased by 25% Decreased by 25% full  Left rotation Decreased by 25% full full   (Blank rows = not tested)   LOWER EXTREMITY ROM: ER decreased by 25% bil.    LOWER EXTREMITY MMT:   MMT Right eval Left eval right 01/23/23 Left 01/23/23  Hip extension 4/5 4/5 5/5 4/5  Hip abduction 4/5 4/5 4/5 5/5  Hip adduction 4/5 4/5 5/5 5/5    PALPATION:   General  weak abdominal contraction                 External Perineal Exam good coloring                             Internal Pelvic Floor reduction of tenderness Patient confirms identification and approves PT to assess internal pelvic floor and treatment Yes   PELVIC MMT:   MMT eval 12/08/22  Vaginal 2/5 hold 5 sec 2/5  Diastasis Recti 1 finger width    (Blank rows = not tested)         TONE: good   PROLAPSE: none   TODAY'S TREATMENT:   02/16/23 Manual: Spinal mobilization: Gapping of the left L4-L1 facets Left Si joint mobilization  Muscle energy to correct left ilium in supine Exercises: Stretches/mobility: Left leg on mat and bend forward 5 times Strengthening: One legged bridge 10x each Dead bug working in the range she is able to stabilize her core Bird dog 10x each side Fire hydrant 10x each side Dead lift with green band 10x  Single leg squat 10x each side   01/28/23 Manual: Soft tissue mobilization: To assess for dry needling Manual work to the right quadratus, along the right interscapular area and along the right thoracolumbar area to elongate after dry needling Spinal mobilization: Gapping of the right L5-S1 facet in sidely Trigger Point Dry-Needling  Treatment instructions: Expect mild to  moderate muscle soreness. S/S of pneumothorax if dry needled over a lung field, and to seek immediate medical attention should they occur. Patient verbalized understanding of these instructions and education. Patient Consent Given: Yes Education handout provided: Previously provided Muscles treated: right Quadratus, right thoracolumbar longus, right levator scapulae Electrical stimulation performed: No Parameters: N/A Treatment response/outcome: elongation of muscle and trigger point response   Exercises: Strengthening: One legged bridge 10x each Dead lift with 10# kettle bell Lung onto 6 inch step 15x on each side One legged leg dead lift 15x each leg    31-Jan-2023 Manual: Soft tissue mobilization: To assess for dry needling Manual work to left lumbar paraspinals, left gluteals, piriformis Spinal mobilization: Gapping of the left L3-S1 facets in sidely and sitting Trigger Point Dry-Needling  Treatment instructions: Expect mild to moderate muscle soreness. S/S of pneumothorax if dry needled over a lung field, and to seek immediate medical attention should they occur. Patient verbalized understanding of these instructions and education.   Patient Consent Given: Yes Education handout provided: Previously provided Muscles treated: left lumbar multifidi, left gluteus medius, gluteus maximus, gluteus minimus, piriformis Electrical stimulation performed: Yes Parameters:  to patient tolerance Treatment response/outcome: elongation of muscle and trigger point response                                                                                                               PATIENT EDUCATION: 02/16/23 Education details: Access Code: [email or web address] Person educated: Patient Education method: Explanation, Demonstration, Tactile cues, Verbal cues, and Handouts Education comprehension: verbalized understanding, returned demonstration, verbal cues required, tactile cues required, and needs further  education     HOME EXERCISE PROGRAM: 02/16/23 Access Code: [email or web address] URL: [email or web address] Date: 02/16/2023 Prepared by: Eulis Foster  Program Notes lay on back, put one leg at 90/90, red band around hands, move arms up and down 10x each side.   Exercises - Bridge with Hip Abduction and Resistance  - 1 x daily - 3 x weekly - 1 sets - 10 reps - Single Leg Bridge  - 1 x daily - 3 x weekly - 2 sets - 10 reps - Supine Dead Bug with Leg Extension  - 1 x daily - 3 x weekly - 1 sets - 10 reps - Bird Dog  - 1 x daily - 3 x weekly - 2 sets - 10 reps - Quadruped Fire Hydrant  - 1 x daily - 3 x weekly - 2 sets - 10 reps - Deadlift with Resistance  - 1 x daily - 3 x weekly - 2 sets - 10 reps - Single Leg Squat with Chair Touch  - 1 x daily - 3 x weekly -  1 sets - 10 reps   ASSESSMENT:   CLINICAL IMPRESSION: Patient is a 32 y.o. female who was seen today for physical therapy  treatment for pelvic and perineal pain. Patient has increased ROM in  lumbar. Her pelvis was leveled after manual work. She was doing well until last week she slept on a hard mattress and had increased in left SI pain. Patient is not having the pelvic floor pain anymore. Patient is independent with her SI stabilization home exercise program.    OBJECTIVE IMPAIRMENTS: decreased activity tolerance, decreased coordination, decreased endurance, decreased mobility, decreased ROM, decreased strength, increased fascial restrictions, and pain.    ACTIVITY LIMITATIONS: carrying, lifting, bending, standing, squatting, continence, and caring for others   PARTICIPATION LIMITATIONS: meal prep, cleaning, laundry, shopping, and community activity   PERSONAL FACTORS: Fitness and 1-2 comorbidities: hypothyroidism, recent vaginal birth on 10/12/22  are also affecting patient's functional outcome.    REHAB POTENTIAL: Excellent   CLINICAL DECISION MAKING: Evolving/moderate complexity   EVALUATION COMPLEXITY: Low      GOALS: Goals reviewed with patient? Yes   SHORT TERM GOALS: Target date: 12/26/22   Patient independent with initial HEP for SI mobility.  Baseline:not educated yet Goal status:Met 12/17/22   2.  Patient is able to bulge fully and contract in a circle with the pelvic floor Baseline: unable to contract and hug the therapist finger Goal status: Met 01/16/23   3.  Patient is able to fully contract the upper and lower abdominals to work on stability.  Baseline: Not able to  Goal status: Met 12/17/22     LONG TERM GOALS: Target date: 02/20/2023   Patient independent with advanced HEP for core, pelvic floor and SI stability.  Baseline: not educated yet Goal status: Met 02/16/23   2.  Patient is able to sit for 45 minutes with pain decreased >/= 1-2/10.  Baseline: 2/10 Goal status: Met 02/16/23   3.  Patient able to carry her infant in the carrier with pain level >/= 1-2/10 due to decreased trigger points in the pelvic floor. Baseline: pain level 2/10 in left SI joint Goal status: Met 01/23/23   4.  Patient constant discomfort of feeling she has a UTI has improved >/= 80% due to release of the pelvic floor.  Baseline: Constantly feels she has a UTI  Goal status: Met 01/16/23   5. Patient POPIQ-7 is </= 8 compared  POPIQ-7 19 due to decreased pain and improved SI mobility.  Baseline: POPIQ-7 24 Goal status: not met 02/16/23     PLAN:   PT FREQUENCY: 1x/week   PT DURATION: 12 weeks   PLANNED INTERVENTIONS: Therapeutic exercises, Therapeutic activity, Neuromuscular re-education, Patient/Family education, Joint mobilization, Dry Needling, Spinal mobilization, Taping, Biofeedback, and Manual therapy   PLAN FOR NEXT SESSION:   Core engagement, dry needling or manual work as needed; SI joint stabilization,     Eulis FosterCheryl Naviah Belfield, PT 02/16/23 8:05 AM  PHYSICAL THERAPY DISCHARGE SUMMARY  Visits from Start of Care: 9  Current functional level related to goals / functional outcomes: See above.     Remaining deficits: See above.    Education / Equipment: HEP   Patient agrees to discharge. Patient goals were met. Patient is being discharged due to meeting the stated rehab goals. Thank you for the referral. Eulis FosterCheryl Shila Kruczek, PT 02/16/23 8:37 AM

## 2023-03-02 ENCOUNTER — Encounter: Payer: Medicaid Other | Admitting: Physical Therapy

## 2023-03-09 ENCOUNTER — Encounter: Payer: Medicaid Other | Admitting: Physical Therapy

## 2023-04-22 ENCOUNTER — Other Ambulatory Visit: Payer: Self-pay

## 2023-04-22 DIAGNOSIS — E039 Hypothyroidism, unspecified: Secondary | ICD-10-CM

## 2023-04-22 MED ORDER — LEVOTHYROXINE SODIUM 75 MCG PO TABS: 75.0000 ug | ORAL_TABLET | Freq: Every day | ORAL | 3 refills | Status: DC

## 2023-05-15 DIAGNOSIS — E039 Hypothyroidism, unspecified: Secondary | ICD-10-CM | POA: Diagnosis not present

## 2023-05-16 LAB — T4, FREE: Free T4: 1.25 ng/dL (ref 0.82–1.77)

## 2023-05-16 LAB — TSH: TSH: 1.01 u[IU]/mL (ref 0.450–4.500)

## 2023-05-20 ENCOUNTER — Encounter: Payer: Self-pay | Admitting: Nurse Practitioner

## 2023-05-20 ENCOUNTER — Ambulatory Visit: Payer: Medicaid Other | Admitting: Nurse Practitioner

## 2023-05-20 VITALS — BP 110/60 | HR 65 | Ht 67.0 in | Wt 137.9 lb

## 2023-05-20 DIAGNOSIS — E039 Hypothyroidism, unspecified: Secondary | ICD-10-CM

## 2023-05-20 NOTE — Patient Instructions (Signed)

## 2023-05-20 NOTE — Progress Notes (Signed)
Endocrinology Follow Up Note                                         05/20/2023, 11:30 AM    Subjective:   Subjective    Olivia Shepard is a 32 y.o.-year-old female patient being seen in follow up after being seen in consultation for hypothyroidism referred by Westley Chandler, MD.   Past Medical History:  Diagnosis Date   Anemia    Anxiety    GERD (gastroesophageal reflux disease)    History of postpartum hemorrhage, currently pregnant    Hypothyroidism     Past Surgical History:  Procedure Laterality Date   ESOPHAGEAL DILATION      Social History   Socioeconomic History   Marital status: Married    Spouse name: Not on file   Number of children: Not on file   Years of education: Not on file   Highest education level: Not on file  Occupational History   Not on file  Tobacco Use   Smoking status: Never   Smokeless tobacco: Never  Vaping Use   Vaping Use: Never used  Substance and Sexual Activity   Alcohol use: No    Alcohol/week: 0.0 standard drinks of alcohol   Drug use: No   Sexual activity: Yes    Birth control/protection: None  Other Topics Concern   Not on file  Social History Narrative   Works as Engineer, civil (consulting) on L and D at [employer]    Has 4 children, 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   Social Determinants of Health   Financial Resource Strain: Not on file  Food Insecurity: No Food Insecurity (10/11/2022)   Hunger Vital Sign    Worried About Running Out of Food in the Last Year: Never true    Ran Out of Food in the Last Year: Never true  Transportation Needs: No Transportation Needs (10/11/2022)   PRAPARE - Administrator, Civil Service (Medical): No    Lack of Transportation (Non-Medical): No  Physical Activity: Not on file  Stress: Not on file  Social Connections: Not on file    Family History  Problem Relation Age of Onset   Diabetes Mother    Varicose Veins Father    Lung  cancer Father    Lung cancer Maternal Grandmother    Varicose Veins Paternal Grandmother    Colon cancer Paternal Great-grandfather    Stomach cancer Paternal Great-grandfather    Colon cancer Paternal Great-grandmother    Esophageal cancer Neg Hx     Outpatient Encounter Medications as of 05/20/2023  Medication Sig   levothyroxine (SYNTHROID) 75 MCG tablet Take 1 tablet (75 mcg total) by mouth daily before breakfast.   Prenatal Vit-Fe Fumarate-FA (PRENATAL MULTIVITAMIN) TABS tablet Take 1 tablet by mouth daily.   sertraline (ZOLOFT) 25 MG tablet Take 25 mg by mouth daily.   [DISCONTINUED] ibuprofen (ADVIL) 600 MG tablet Take 1 tablet (600 mg total) by mouth every 6 (six) hours. (Patient not taking:  Reported on 01/22/2023)   [DISCONTINUED] lidocaine-prilocaine (EMLA) cream Apply 1 application. topically as needed (for urethral irritation). (Patient not taking: Reported on 11/05/2022)   [DISCONTINUED] pantoprazole (PROTONIX) 40 MG tablet Take 40 mg by mouth daily. (Patient not taking: Reported on 01/22/2023)   No facility-administered encounter medications on file as of 05/20/2023.    ALLERGIES: No Known Allergies VACCINATION STATUS: Immunization History  Administered Date(s) Administered   DTaP 11/10/1989   Hepatitis B 08/06/2000, 01/28/2001, 10/27/2005   Hepatitis B, PED/ADOLESCENT 08/06/2000, 08/06/2000, 01/28/2001, 01/28/2001, 10/27/2005, 10/27/2005   Influenza Split 07/28/2012   Influenza, Seasonal, Injecte, Preservative Fre 07/28/2012, 08/03/2013, 08/11/2015, 08/18/2017, 08/27/2018   Influenza,inj,Quad PF,6+ Mos 08/27/2018, 06/27/2019, 08/02/2021   Influenza,inj,Quad PF,6-35 Mos 06/27/2019   Influenza,inj,quad, With Preservative 07/28/2012, 08/03/2013, 08/11/2015, 08/18/2017, 08/27/2018   Influenza-Unspecified 07/28/2012, 08/03/2013, 08/11/2015, 08/18/2017, 06/27/2019   MMR 02/17/1991, 02/09/1995, 09/18/2016, 10/13/2022   Meningococcal Conjugate 06/27/2008   Meningococcal  polysaccharide vaccine (MPSV4) 06/27/2008   PFIZER Comirnaty(Gray Top)Covid-19 Tri-Sucrose Vaccine 12/25/2020   PFIZER(Purple Top)SARS-COV-2 Vaccination 06/20/2020, 07/11/2020   PPD Test 06/14/2008, 02/03/2011, 02/13/2012   Td 04/25/2005   Tdap 02/09/1995, 08/10/2014, 08/29/2016, 04/30/2020     HPI   Olivia Shepard  is a patient with the above medical history. she was diagnosed with hypothyroidism a few years ago as she and her husband were trying to conceive a child.  She was started on Levothyroxine 25 mcg po daily as a result, now up to 75 mcg po daily.  She has been tested for thyroid antibodies several times, negative.    -She is currently on Levothyroxine 75 mcg po daily before breakfast.  I reviewed patient's thyroid tests:  Lab Results  Component Value Date   TSH 1.010 05/15/2023   TSH 1.300 01/19/2023   TSH 0.758 10/29/2022   TSH 1.700 09/04/2022   TSH 1.320 07/18/2022   TSH 2.380 05/27/2022   TSH 2.200 04/14/2022   TSH 2.47 03/20/2022   TSH 2.29 03/03/2022   TSH 2.480 08/02/2021   FREET4 1.25 05/15/2023   FREET4 1.23 01/19/2023   FREET4 1.55 10/29/2022   FREET4 1.11 09/04/2022   FREET4 1.19 07/18/2022   FREET4 1.27 05/27/2022   FREET4 1.18 04/14/2022   FREET4 0.98 08/31/2015     Pt denies feeling nodules in neck, hoarseness, dysphagia/odynophagia, SOB with lying down.  she does have family history of thyroid disorders in her mom (recently diagnosed).  No family history of thyroid cancer. No history of radiation therapy to head or neck.  No recent use of iodine supplements.  Denies use of Biotin containing supplements.  I reviewed her chart and she also has a history of GERD (had to have esophagus stretched), anemia, anxiety.   Review of systems  Constitutional: + steadily decreasing body weight,  current Body mass index is 21.6 kg/m. , no fatigue, no subjective hyperthermia, no subjective hypothermia Eyes: no blurry vision, no xerophthalmia ENT: no sore  throat, no nodules palpated in throat, no dysphagia/odynophagia, no hoarseness Cardiovascular: no chest pain, no shortness of breath, no palpitations, no leg swelling Respiratory: no cough, no shortness of breath Gastrointestinal: no nausea/vomiting/diarrhea Musculoskeletal: no muscle/joint aches Skin: no rashes, no hyperemia Neurological: no tremors, no numbness, no tingling, no dizziness Psychiatric: no depression, no anxiety   Objective:   Objective     BP 110/60 (BP Location: Right Arm, Patient Position: Sitting, Cuff Size: Normal)   Pulse 65   Ht 5\' 7"  (1.702 m)   Wt 137 lb 14.4 oz (62.6 kg)   BMI  21.60 kg/m  Wt Readings from Last 3 Encounters:  05/20/23 137 lb 14.4 oz (62.6 kg)  01/22/23 156 lb (70.8 kg)  11/05/22 160 lb (72.6 kg)    BP Readings from Last 3 Encounters:  05/20/23 110/60  01/22/23 104/69  10/13/22 93/60       Physical Exam- Limited  Constitutional:  Body mass index is 21.6 kg/m. , not in acute distress, normal state of mind Eyes:  EOMI, no exophthalmos Musculoskeletal: no gross deformities, strength intact in all four extremities, no gross restriction of joint movements Skin:  no rashes, no hyperemia Neurological: no tremor with outstretched hands   CMP ( most recent) CMP     Component Value Date/Time   NA 143 08/02/2021 0937   K 5.1 08/02/2021 0937   CL 103 08/02/2021 0937   CO2 26 08/02/2021 0937   GLUCOSE 68 08/02/2021 0937   GLUCOSE 71 12/27/2015 1501   BUN 18 08/02/2021 0937   CREATININE 0.67 08/02/2021 0937   CREATININE 0.55 12/27/2015 1501   CALCIUM 10.0 08/02/2021 0937   PROT 7.1 08/02/2021 0937   ALBUMIN 4.8 08/02/2021 0937   AST 11 08/02/2021 0937   ALT 12 08/02/2021 0937   ALKPHOS 62 08/02/2021 0937   BILITOT 0.3 08/02/2021 0937   GFRNONAA 109 08/27/2018 1021   GFRNONAA >89 12/27/2015 1501   GFRAA 125 08/27/2018 1021   GFRAA >89 12/27/2015 1501     Diabetic Labs (most recent): No results found for: "HGBA1C",  "MICROALBUR"   Lipid Panel ( most recent) Lipid Panel     Component Value Date/Time   CHOL 144 12/27/2015 1501   TRIG 32 12/27/2015 1501   HDL 83 12/27/2015 1501   CHOLHDL 1.7 12/27/2015 1501   VLDL 6 12/27/2015 1501   LDLCALC 55 12/27/2015 1501       Lab Results  Component Value Date   TSH 1.010 05/15/2023   TSH 1.300 01/19/2023   TSH 0.758 10/29/2022   TSH 1.700 09/04/2022   TSH 1.320 07/18/2022   TSH 2.380 05/27/2022   TSH 2.200 04/14/2022   TSH 2.47 03/20/2022   TSH 2.29 03/03/2022   TSH 2.480 08/02/2021   FREET4 1.25 05/15/2023   FREET4 1.23 01/19/2023   FREET4 1.55 10/29/2022   FREET4 1.11 09/04/2022   FREET4 1.19 07/18/2022   FREET4 1.27 05/27/2022   FREET4 1.18 04/14/2022   FREET4 0.98 08/31/2015     Latest Reference Range & Units 07/18/22 16:05 09/04/22 14:15 10/29/22 11:06 01/19/23 16:20 05/15/23 16:02  TSH 0.450 - 4.500 uIU/mL 1.320 1.700 0.758 1.300 1.010  T4,Free(Direct) 0.82 - 1.77 ng/dL 1.61 0.96 0.45 4.09 8.11    Assessment & Plan:   ASSESSMENT / PLAN:  1. Hypothyroidism-acquired  Patient with long-standing hypothyroidism, on levothyroxine therapy. On physical exam, patient does not have gross goiter, thyroid nodules, or neck compression symptoms.  Her antibody testing was negative, ruling out autoimmune thyroid dysfunction.    Her previsit thyroid function tests are consistent with appropriate hormone replacement.  She is advised to continue Levothyroxine 75 mcg po daily before breakfast.  She is advised to reach out if she starts to develop symptoms so we can check sooner if needed.  - We discussed about correct intake of levothyroxine, at fasting, with water, separated by at least 30 minutes from breakfast, and separated by more than 4 hours from calcium, iron, multivitamins, acid reflux medications (PPIs). -Patient is made aware of the fact that thyroid hormone replacement is needed for life, dose to be  adjusted by periodic monitoring of  thyroid function tests.   -Due to absence of clinical goiter, no need for thyroid ultrasound at this time.     I spent  12  minutes in the care of the patient today including review of labs from Thyroid Function, CMP, and other relevant labs ; imaging/biopsy records (current and previous including abstractions from other facilities); face-to-face time discussing  her lab results and symptoms, medications doses, her options of short and long term treatment based on the latest standards of care / guidelines;   and documenting the encounter.  Olivia Shepard  participated in the discussions, expressed understanding, and voiced agreement with the above plans.  All questions were answered to her satisfaction. she is encouraged to contact clinic should she have any questions or concerns prior to her return visit.   FOLLOW UP PLAN:  Return in about 6 months (around 11/20/2023) for Thyroid follow up, Previsit labs.  Ronny Bacon, [facility] [address] [place], [place] [address] Phone: [phone] Fax: [phone]  05/20/2023, 11:30 AM

## 2023-07-15 ENCOUNTER — Telehealth: Payer: Medicaid Other | Admitting: Physician Assistant

## 2023-07-15 DIAGNOSIS — B353 Tinea pedis: Secondary | ICD-10-CM

## 2023-07-15 NOTE — Progress Notes (Signed)
For the safety of you and your child, I recommend a face to face office visit with a health care provider.  Many mothers need to take medicines during their pregnancy and while nursing.  Almost all medicines pass into the breast milk in small quantities.  Most are generally considered safe for a mother to take but some medicines must be avoided.  After reviewing your E-Visit request, I recommend that you consult your OB/GYN or pediatrician for medical advice in relation to your condition and prescription medications while pregnant or breastfeeding.  NOTE:  There will be NO CHARGE for this eVisit  If you are having a true medical emergency please call 911.    For an urgent face to face visit, [place] has six urgent care centers for your convenience:     [facility] [place] Get Driving Directions [phone] [address] [place], [place] [address]    [facility] ([place]) Get Driving Directions [phone] [address] [place], [place] [address]  [facility] ([place]) Get Driving Directions [phone] [address] [place],  [place]  [address]  [facility] Get Driving Directions [phone] [address] [place], [place] [address]   [facility] [place] Get Driving Directions  [phone] [address] [place], [place] [address]   [facility] [place] Get Driving Directions [phone] [address] [place], [place] [address]  Your MyChart E-visit questionnaire answers were reviewed by a board certified advanced clinical practitioner to complete your personal care plan based on your specific symptoms.  Thank you for using e-Visits.    

## 2023-07-30 ENCOUNTER — Ambulatory Visit: Payer: Medicaid Other

## 2023-08-05 ENCOUNTER — Other Ambulatory Visit: Payer: Self-pay

## 2023-08-05 ENCOUNTER — Ambulatory Visit: Payer: Medicaid Other | Admitting: Student

## 2023-08-05 ENCOUNTER — Encounter: Payer: Self-pay | Admitting: Student

## 2023-08-05 VITALS — BP 105/75 | HR 89 | Ht 67.0 in | Wt 139.0 lb

## 2023-08-05 DIAGNOSIS — G8929 Other chronic pain: Secondary | ICD-10-CM

## 2023-08-05 DIAGNOSIS — L03032 Cellulitis of left toe: Secondary | ICD-10-CM | POA: Diagnosis not present

## 2023-08-05 DIAGNOSIS — M25562 Pain in left knee: Secondary | ICD-10-CM | POA: Diagnosis not present

## 2023-08-05 DIAGNOSIS — Z23 Encounter for immunization: Secondary | ICD-10-CM

## 2023-08-05 DIAGNOSIS — M25569 Pain in unspecified knee: Secondary | ICD-10-CM | POA: Insufficient documentation

## 2023-08-05 MED ORDER — TRIAMCINOLONE ACETONIDE 0.1 % EX OINT: 1.0000 | TOPICAL_OINTMENT | Freq: Two times a day (BID) | CUTANEOUS | 0 refills | Status: DC

## 2023-08-05 NOTE — Progress Notes (Signed)
    SUBJECTIVE:   CHIEF COMPLAINT / HPI:   Olivia Shepard is a 32 year-old female here for left knee pain and concern for fungal infection on toe.  Left knee pain -Onset- Happened in May, thought it would get better using athletic tape but not getting better -She did have any acute injury in which she twisted her knee and had a popping sensation she had immediate swelling. -Denies any instability or weakness, or knee buckling. -Pain is mostly on the medial joint line.  Itching and irritation of left second toe -Present for years -Thought to be fungal in nature, treated with topicals as she cannot take oral antifungal during her pregnancies. -She has been applying nystatin with moderate relief. 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 No wheezing or crackles on auscultation, good lung sounds throughout Extremities: Moving all 4 extremities equally Left knee: Normal gait.  No obvious ecchymosis, bony deformity, knee effusion.  Tenderness to palpation of the medial joint line.  Ligamentously intact.  Negative anterior drawer.  Negative valgus and varus stress test.  Positive Thessaly's test.   ASSESSMENT/PLAN:   Knee pain Signs and symptoms most consistent with possible meniscal pathology given positive Thessaly's test, medial joint line tenderness and twisting injury.  She does not have any ligamentous instability today. Also consider patellofemoral pain syndrome, but not have any worsening anterior knee pain with squatting or climbing stairs. Could have underlying tendinopathy or bursitis. No referred pain concerning for hip pathology. Given persistence and chronicity of symptoms, referral to orthopedic surgery.   Would likely benefit from ultrasound of left knee for meniscal evaluation.  In the meantime, encouraged conservative treatment with ice/heat, home stretches and exercises, and ibuprofen or Tylenol as needed for pain.  Paronychia of second toe, left Flaky, dry irritated skin with slight erythema. No concern for cellulitis. Plan to trial triamcinolone 0.1% twice daily. Encouraged to soak the area for moderate for about 15 minutes 2-3 times per day. If no improvement, return or call her podiatrist.     Darral Dash, DO [facility]

## 2023-08-05 NOTE — Patient Instructions (Addendum)
It was great seeing you today.  As we discussed, -I sent in a topical steroid cream (triamcinolone) to use on your toe.  Apply this twice per day. -I placed a referral to orthopedics for your knee pain.  I am concerned that you may have a meniscal tear.  They will ultrasound or get other imaging to see exactly what is going on.  In the meantime, you may use ice/heat as needed for pain, topical Voltaren gel.   If you have any questions or concerns, please feel free to call the clinic.   Have a wonderful day,  Dr. Darral Dash [facility] [phone]

## 2023-08-08 DIAGNOSIS — L03032 Cellulitis of left toe: Secondary | ICD-10-CM | POA: Insufficient documentation

## 2023-08-08 NOTE — Assessment & Plan Note (Addendum)
Flaky, dry irritated skin with slight erythema. No concern for cellulitis. Plan to trial triamcinolone 0.1% twice daily. Encouraged to soak the area for moderate for about 15 minutes 2-3 times per day. If no improvement, return or call her podiatrist.

## 2023-08-08 NOTE — Assessment & Plan Note (Signed)
Signs and symptoms most consistent with possible meniscal pathology given positive Thessaly's test, medial joint line tenderness and twisting injury.  She does not have any ligamentous instability today. Also consider patellofemoral pain syndrome, but not have any worsening anterior knee pain with squatting or climbing stairs. Could have underlying tendinopathy or bursitis. No referred pain concerning for hip pathology. Given persistence and chronicity of symptoms, referral to orthopedic surgery.  Would likely benefit from ultrasound of left knee for meniscal evaluation.  In the meantime, encouraged conservative treatment with ice/heat, home stretches and exercises, and ibuprofen or Tylenol as needed for pain.

## 2023-08-14 ENCOUNTER — Ambulatory Visit (INDEPENDENT_AMBULATORY_CARE_PROVIDER_SITE_OTHER): Payer: Medicaid Other | Admitting: Physician Assistant

## 2023-08-14 ENCOUNTER — Other Ambulatory Visit: Payer: Self-pay

## 2023-08-14 DIAGNOSIS — G8929 Other chronic pain: Secondary | ICD-10-CM

## 2023-08-14 DIAGNOSIS — M25562 Pain in left knee: Secondary | ICD-10-CM

## 2023-08-14 NOTE — Progress Notes (Signed)
Office Visit Note   Patient: Olivia Shepard           Date of Birth: June 26, 1991           MRN: [id number] Visit Date: 08/14/2023              Requested by: McDiarmid, Leighton Roach, MD [address] [place],  [place] [address] PCP: Westley Chandler, MD   Assessment & Plan: Visit Diagnoses: Left knee medial meniscus tear.   Plan: Active 32 year old woman with a 74-month history of left medial knee pain after awkwardly exiting a [employer] but was parked on a hill and twisting her knee.  She says this happened 5 months ago and her knee continues to be bothersome over the medial side of the knee she also has reported locking and catching.  This is getting worse rather than better.  Because of her mechanical symptoms and length of time from injury I recommended an MRI.  Will have her follow-up with Dr.Xu  Follow-Up Instructions: After MRI  Orders:  No orders of the defined types were placed in this encounter.  No orders of the defined types were placed in this encounter.     Procedures: No procedures performed   Clinical Data: No additional findings.   Subjective: Chief Complaint  Patient presents with   Left Knee - Pain    HPI patient is a pleasant 32 year old female with a 46-month history of left knee pain.  She said this happened after twisting her knee and had a popping sensation associated with swelling.  Thought it was a originally a sprain.  Has gotten worse in the last 2 weeks.  She has tried ice tape and ibuprofen without improvement rates her pain is moderate she does have locking and catching sensations.  She has tried ibuprofen topical anti-inflammatories she feels like her knee gives out and is weak  Review of Systems  All other systems reviewed and are negative.    Objective: Vital Signs: There were no vitals taken for this visit.  Physical Exam Constitutional:      Appearance: Normal appearance.  Pulmonary:     Effort: Pulmonary effort is normal.  Skin:     General: Skin is warm and dry.  Neurological:     Mental Status: She is alert.  Psychiatric:        Mood and Affect: Mood normal.        Behavior: Behavior normal.     Ortho Exam Left knee she has no effusion erythema no cellulitis her compartments are soft and tender.  She has good dorsiflexion and plantarflexion of her ankle.  Quadriceps and patella tendon mechanisms are intact she has good strength with resisted extension and flexion.  She does have pain reproduced over the medial joint line with palpation and terminal extension.  Does not have any significant pain over insertion of MCL or patellofemoral joint no grinding Specialty Comments:  No specialty comments available.  Imaging: No results found.   PMFS History: Patient Active Problem List   Diagnosis Date Noted   Paronychia of second toe, left 08/08/2023   Knee pain 08/05/2023   Precipitous delivery, delivered (current hospitalization) 10/12/2022   Indication for care in labor and delivery, antepartum 10/11/2022   Rash 07/31/2022   Other intervertebral disc degeneration, lumbar region 02/09/2022   Fatigue 08/02/2021   Dysphagia 11/14/2020   Pelvic floor dysfunction 10/02/2020   Varicose veins of legs, antepartum 12/19/2019   Plantar wart, right foot 03/18/2018  GERD (gastroesophageal reflux disease) 07/25/2016   Tinea pedis of both feet 07/25/2016   Hypothyroidism 07/19/2015   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   Chronic UTI 10/24/2013   IBS (irritable bowel syndrome) 06/29/2012   Past Medical History:  Diagnosis Date   Anemia    Anxiety    GERD (gastroesophageal reflux disease)    History of postpartum hemorrhage, currently pregnant    Hypothyroidism     Family History  Problem Relation Age of Onset   Diabetes Mother    Varicose Veins Father    Lung cancer Father    Lung cancer Maternal Grandmother    Varicose Veins Paternal Grandmother    Colon cancer Paternal Great-grandfather    Stomach  cancer Paternal Great-grandfather    Colon cancer Paternal Great-grandmother    Esophageal cancer Neg Hx     Past Surgical History:  Procedure Laterality Date   ESOPHAGEAL DILATION     Social History   Occupational History   Not on file  Tobacco Use   Smoking status: Never   Smokeless tobacco: Never  Vaping Use   Vaping status: Never Used  Substance and Sexual Activity   Alcohol use: No    Alcohol/week: 0.0 standard drinks of alcohol   Drug use: No   Sexual activity: Yes    Birth control/protection: None

## 2023-08-17 ENCOUNTER — Encounter: Payer: Self-pay | Admitting: Student

## 2023-08-17 DIAGNOSIS — I83813 Varicose veins of bilateral lower extremities with pain: Secondary | ICD-10-CM

## 2023-08-17 DIAGNOSIS — R239 Unspecified skin changes: Secondary | ICD-10-CM

## 2023-08-31 ENCOUNTER — Encounter: Payer: Self-pay | Admitting: Physician Assistant

## 2023-08-31 NOTE — Addendum Note (Signed)
Addended by: Manson Passey, Page Lancon on: 08/31/2023 02:04 PM   Modules accepted: Orders

## 2023-09-08 ENCOUNTER — Ambulatory Visit
Admission: RE | Admit: 2023-09-08 | Discharge: 2023-09-08 | Disposition: A | Discharge status: Home / Self Care | Payer: Medicaid Other | Source: Ambulatory Visit | Attending: Physician Assistant

## 2023-09-08 DIAGNOSIS — S83412A Sprain of medial collateral ligament of left knee, initial encounter: Secondary | ICD-10-CM | POA: Diagnosis not present

## 2023-09-08 DIAGNOSIS — G8929 Other chronic pain: Secondary | ICD-10-CM

## 2023-09-08 DIAGNOSIS — M25562 Pain in left knee: Secondary | ICD-10-CM | POA: Diagnosis not present

## 2023-09-15 ENCOUNTER — Ambulatory Visit: Payer: Medicaid Other | Admitting: Orthopaedic Surgery

## 2023-09-15 ENCOUNTER — Encounter: Payer: Self-pay | Admitting: Orthopaedic Surgery

## 2023-09-15 DIAGNOSIS — M25562 Pain in left knee: Secondary | ICD-10-CM

## 2023-09-15 DIAGNOSIS — G8929 Other chronic pain: Secondary | ICD-10-CM | POA: Diagnosis not present

## 2023-09-15 NOTE — Progress Notes (Signed)
Office Visit Note   Patient: Olivia Shepard           Date of Birth: 24-May-1991           MRN: [id number] Visit Date: 09/15/2023              Requested by: Persons, [place], PA [address] [place],  [place] [address] PCP: Westley Chandler, MD   Assessment & Plan: Visit Diagnoses:  1. Chronic pain of left knee     Plan: Patient is a 32 year old female with a resolving left MCL sprain.  MRI scan reviewed with the patient in detail and does not show any structural abnormalities.  She was a little edema around the femoral insertion of the MCL.  MCL feels stable on exam and symmetric with the contralateral side.  Recommend continue with symptomatic treatment.  Recommend knee sleeve during activity.  Follow-up as needed.  Follow-Up Instructions: No follow-ups on file.   Orders:  No orders of the defined types were placed in this encounter.  No orders of the defined types were placed in this encounter.     Procedures: No procedures performed   Clinical Data: No additional findings.   Subjective: Chief Complaint  Patient presents with   Left Knee - Follow-up    MRI review    HPI Olivia Shepard is a 32 year old female who comes in as a referral from [facility] for chronic left knee pain.  Had a twisting injury in August in which her left knee buckled medially.  Due to continued pain MRI was obtained and she is here for MRI review.  She reports minor symptoms to the medial side of the knee.  She reports that the knee is functional for ADLs.  She is a mother of 8 children.  Review of Systems  Constitutional: Negative.   HENT: Negative.    Eyes: Negative.   Respiratory: Negative.    Cardiovascular: Negative.   Endocrine: Negative.   Musculoskeletal: Negative.   Neurological: Negative.   Hematological: Negative.   Psychiatric/Behavioral: Negative.    All other systems reviewed and are negative.    Objective: Vital Signs: There were no vitals taken for this visit.  Physical  Exam Vitals and nursing note reviewed.  Constitutional:      Appearance: She is well-developed.  HENT:     Head: Normocephalic and atraumatic.  Pulmonary:     Effort: Pulmonary effort is normal.  Abdominal:     Palpations: Abdomen is soft.  Musculoskeletal:     Cervical back: Neck supple.  Skin:    General: Skin is warm.     Capillary Refill: Capillary refill takes less than 2 seconds.  Neurological:     Mental Status: She is alert and oriented to person, place, and time.  Psychiatric:        Behavior: Behavior normal.        Thought Content: Thought content normal.        Judgment: Judgment normal.     Ortho Exam Exam of the left knee shows slight tenderness to the femoral insertion of the MCL.  The collaterals are stable and symmetric to the contralateral side.  She has full range of motion.  No joint effusion. Specialty Comments:  No specialty comments available.  Imaging: No results found.   PMFS History: Patient Active Problem List   Diagnosis Date Noted   Paronychia of second toe, left 08/08/2023   Pain in left knee 08/05/2023   Precipitous delivery, delivered (current  hospitalization) 10/12/2022   Indication for care in labor and delivery, antepartum 10/11/2022   Rash 07/31/2022   Other intervertebral disc degeneration, lumbar region 02/09/2022   Fatigue 08/02/2021   Dysphagia 11/14/2020   Pelvic floor dysfunction 10/02/2020   Varicose veins of legs, antepartum 12/19/2019   Plantar wart, right foot 03/18/2018   GERD (gastroesophageal reflux disease) 07/25/2016   Tinea pedis of both feet 07/25/2016   Hypothyroidism 07/19/2015   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   Chronic UTI 10/24/2013   IBS (irritable bowel syndrome) 06/29/2012   Past Medical History:  Diagnosis Date   Anemia    Anxiety    GERD (gastroesophageal reflux disease)    History of postpartum hemorrhage, currently pregnant    Hypothyroidism     Family History  Problem Relation  Age of Onset   Diabetes Mother    Varicose Veins Father    Lung cancer Father    Lung cancer Maternal Grandmother    Varicose Veins Paternal Grandmother    Colon cancer Paternal Great-grandfather    Stomach cancer Paternal Great-grandfather    Colon cancer Paternal Great-grandmother    Esophageal cancer Neg Hx     Past Surgical History:  Procedure Laterality Date   ESOPHAGEAL DILATION     Social History   Occupational History   Not on file  Tobacco Use   Smoking status: Never   Smokeless tobacco: Never  Vaping Use   Vaping status: Never Used  Substance and Sexual Activity   Alcohol use: No    Alcohol/week: 0.0 standard drinks of alcohol   Drug use: No   Sexual activity: Yes    Birth control/protection: None

## 2023-09-24 IMAGING — CT CT L SPINE W/O CM
2 of 3 series · 13 of 33 positions shown, 16 images · non-contrast
Comparison: None available.

CLINICAL DATA: Initial evaluation for acute trauma, lower back
pain.



[Series 4: l-spine wo soft tissue · axial · 0.31mm/px · z∈[+992,+1236]mm · 10 of 146 slices shown, 13 images]
[im 12/146  soft-tissue]
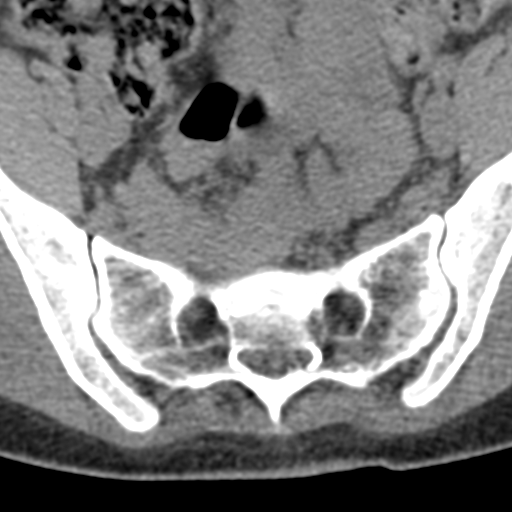
[im 12/146  bone]
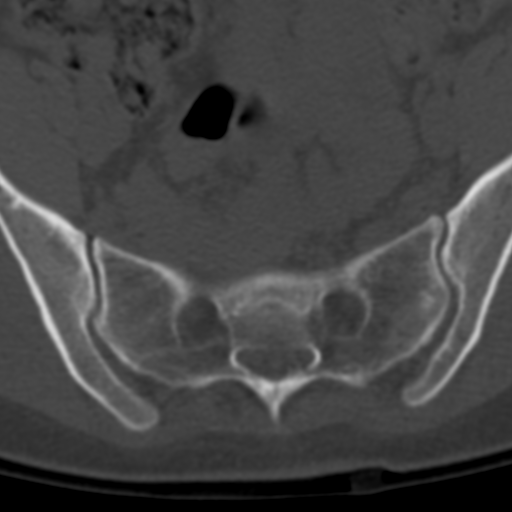
[im 23/146  bone]
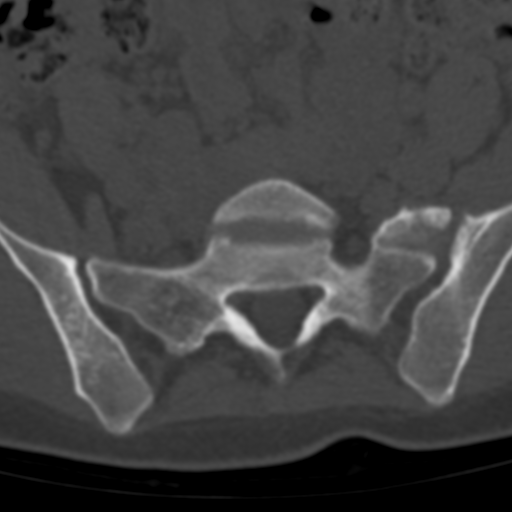
[im 45/146  bone]
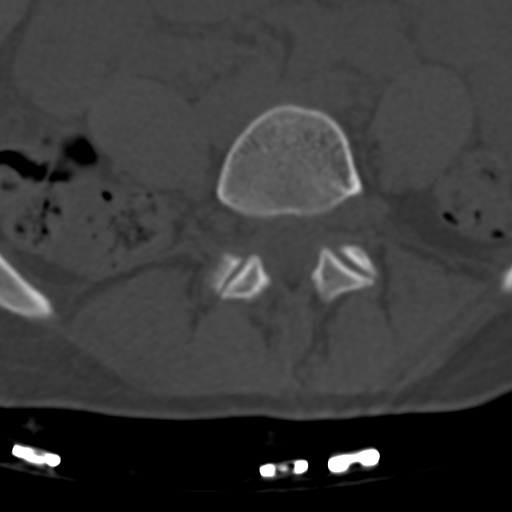
[im 56/146  bone]
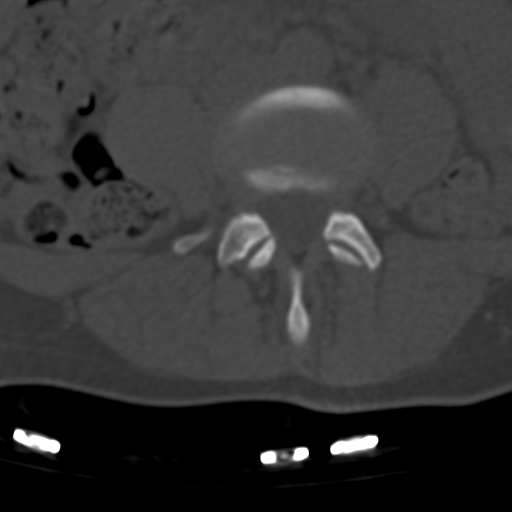
[im 67/146  soft-tissue]
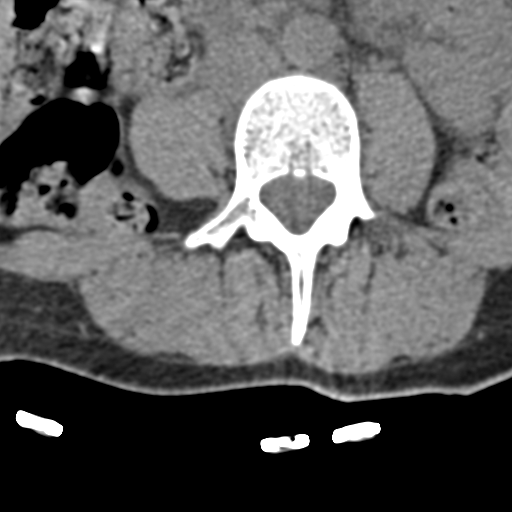
[im 67/146  bone]
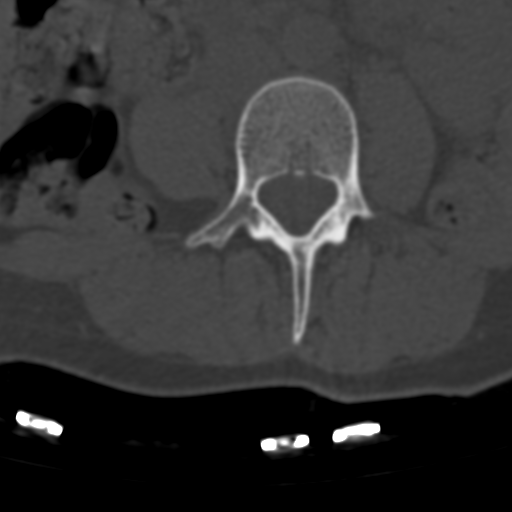
[im 79/146  bone]
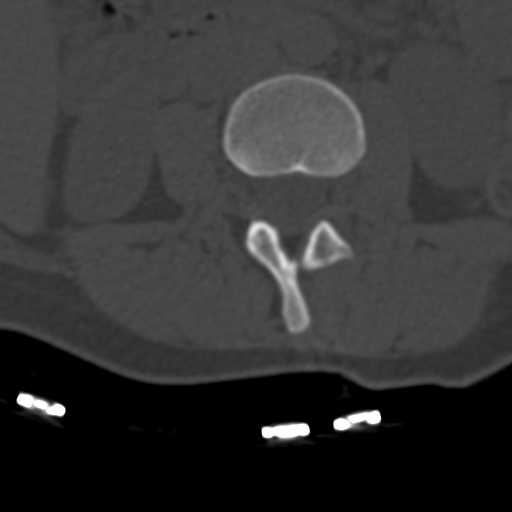
[im 90/146  bone]
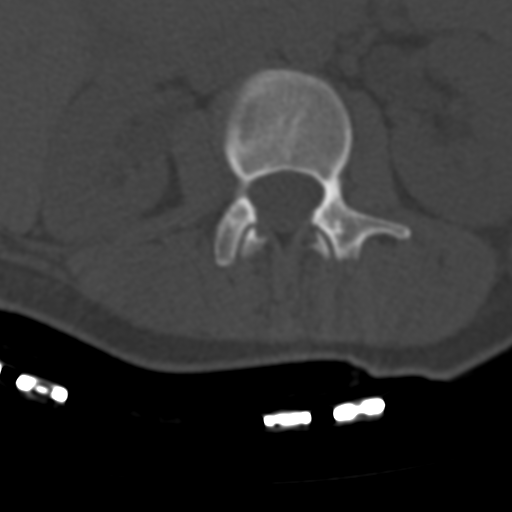
[im 112/146  bone]
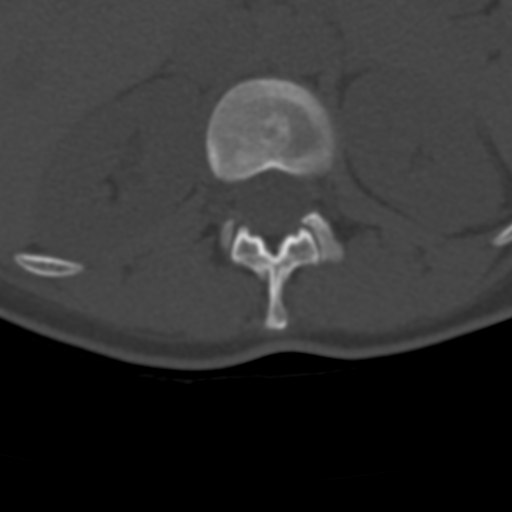
[im 123/146  soft-tissue]
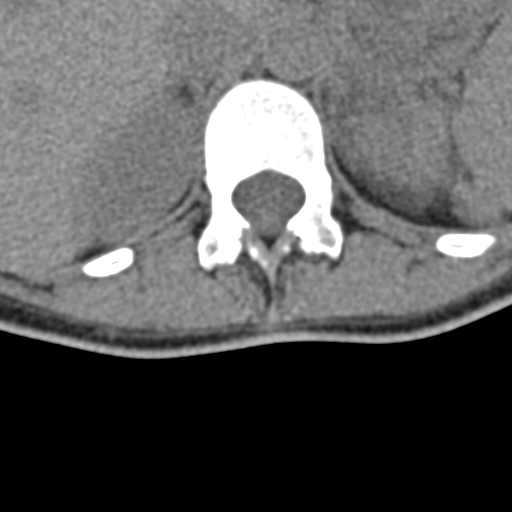
[im 123/146  bone]
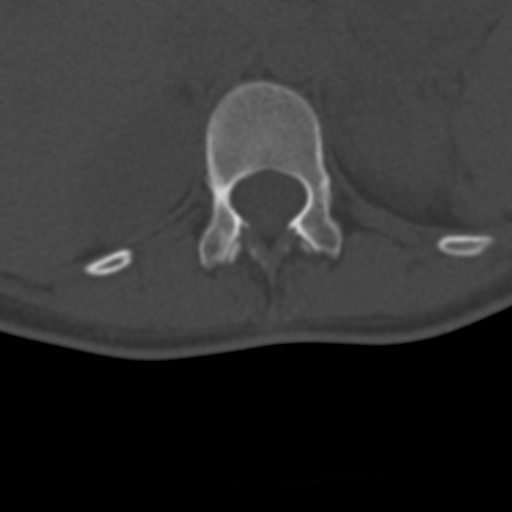
[im 134/146  bone]
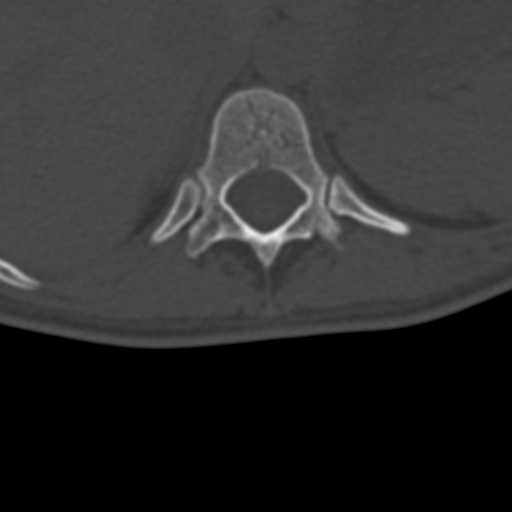

[Series 5: coronal bone · coronal · 0.34mm/px · 3 of 66 slices shown]
[im 14/66  bone]
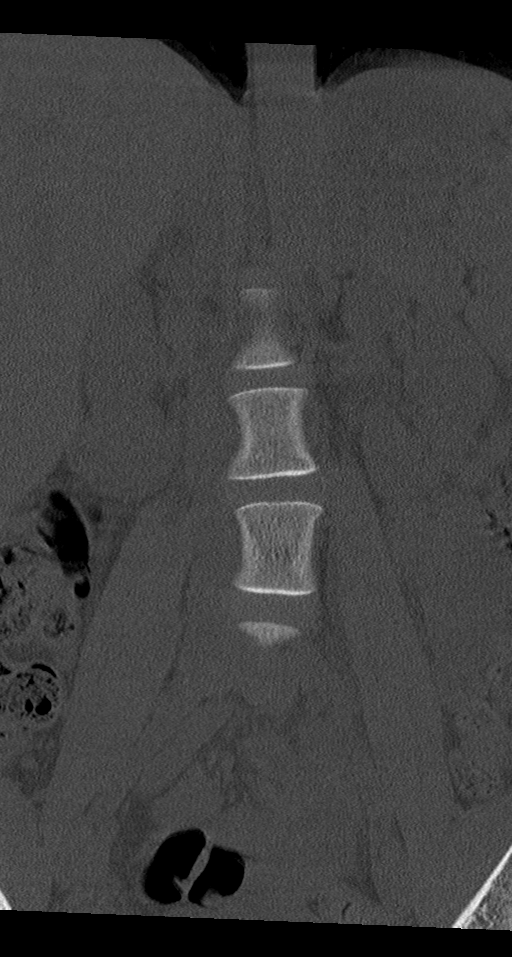
[im 27/66  bone]
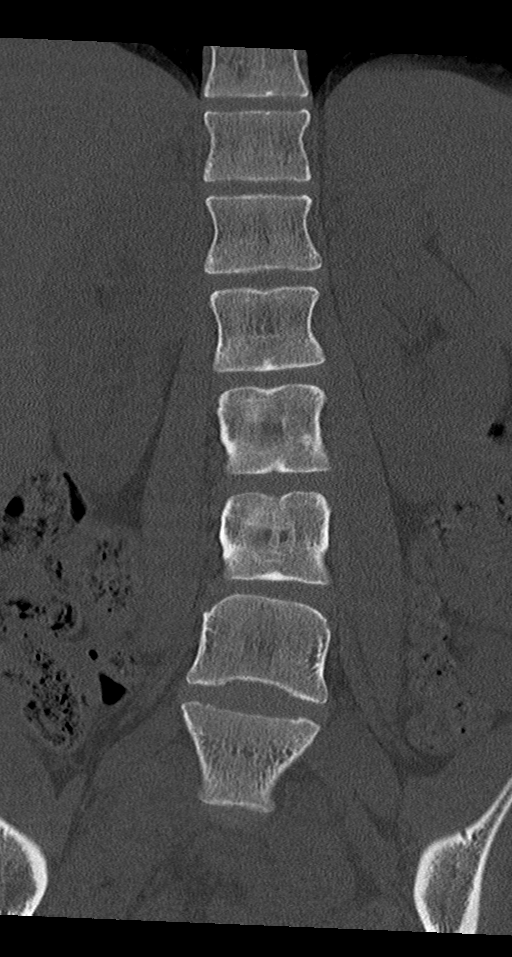
[im 40/66  bone]
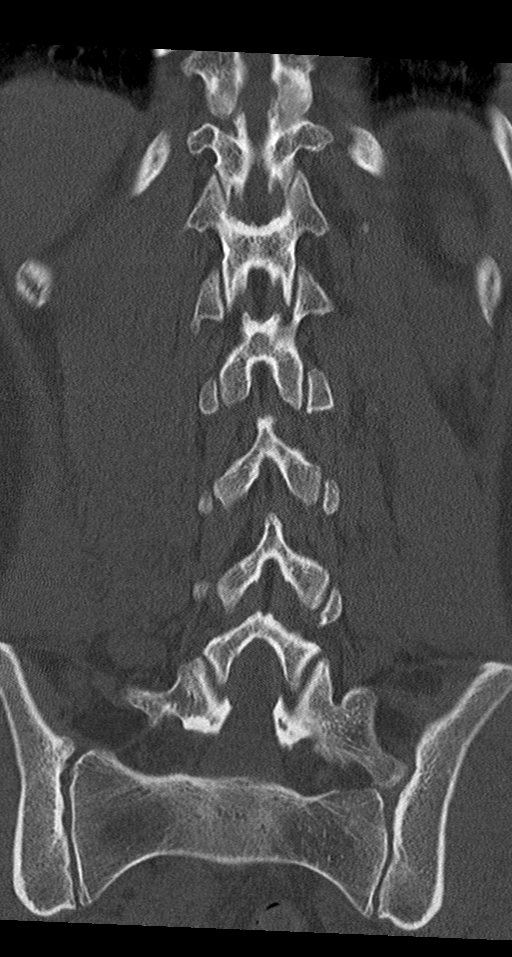

[13 of 33 positions shown; findings below may reference images not displayed]

FINDINGS: Segmentation: Transitional features seen about the lumbosacral
junction. For the purposes of this dictation, lowest rib-bearing
vertebral body will be labeled T12. There is lumbarization of the S1
segment with a rudimentary S1-2 interspace.

Alignment: Mild levoscoliosis with straightening of the normal
lumbar lordosis. No listhesis.

Vertebrae: Vertebral body height maintained without acute or chronic
fracture. No discrete or worrisome osseous lesions. Visualized
sacrum and pelvis intact. SI joints symmetric and within normal
limits.

Paraspinal and other soft tissues: Paraspinous soft tissues within
normal limits.

Disc levels:

L1-2:  Unremarkable.

L2-3:  Unremarkable.

L3-4:  Unremarkable.

L4-5: Mild disc bulge. No spinal stenosis. Foramina remain patent.

L5-S1: Mild posterior disc bulge. Minimal facet spurring. Resultant
mild narrowing of the lateral recesses bilaterally. Central canal
remains patent. No significant foraminal encroachment.

S1-2: Rudimentary S1-2 interspace. No significant disc bulge. Right
worse than left facet hypertrophy. No stenosis or impingement.
IMPRESSION: 1. No acute traumatic injury within the lumbar spine.
2. Mild disc bulge with facet spurring at L5-S1 with resultant mild
narrowing of the lateral recesses bilaterally. No other significant
stenosis or impingement.
3. Transitional lumbosacral anatomy. Careful correlation with
numbering system on this exam recommended prior to any potential
future intervention.

## 2023-10-14 ENCOUNTER — Encounter: Payer: Self-pay | Admitting: Family Medicine

## 2023-10-21 MED ORDER — TRIAMCINOLONE ACETONIDE 0.1 % EX OINT: 1.0000 | TOPICAL_OINTMENT | Freq: Two times a day (BID) | CUTANEOUS | 1 refills | Status: DC

## 2023-11-17 DIAGNOSIS — E039 Hypothyroidism, unspecified: Secondary | ICD-10-CM | POA: Diagnosis not present

## 2023-11-18 ENCOUNTER — Telehealth: Payer: Self-pay | Admitting: Nurse Practitioner

## 2023-11-18 LAB — TSH: TSH: 2.09 u[IU]/mL (ref 0.450–4.500)

## 2023-11-18 LAB — T4, FREE: Free T4: 1.35 ng/dL (ref 0.82–1.77)

## 2023-11-18 NOTE — Telephone Encounter (Signed)
 Patient is asking if tomorrow can be virtual. She forgot she had a funeral to attend at 2

## 2023-11-18 NOTE — Telephone Encounter (Signed)
 Yes, I suppose we could do it this time virtually.

## 2023-11-19 ENCOUNTER — Encounter: Payer: Self-pay | Admitting: Nurse Practitioner

## 2023-11-19 ENCOUNTER — Telehealth (INDEPENDENT_AMBULATORY_CARE_PROVIDER_SITE_OTHER): Payer: Medicaid Other | Admitting: Nurse Practitioner

## 2023-11-19 VITALS — Ht 67.0 in | Wt 140.0 lb

## 2023-11-19 DIAGNOSIS — E039 Hypothyroidism, unspecified: Secondary | ICD-10-CM

## 2023-11-19 MED ORDER — LEVOTHYROXINE SODIUM 75 MCG PO TABS: 75.0000 ug | ORAL_TABLET | Freq: Every day | ORAL | 3 refills | Status: DC

## 2023-11-19 NOTE — Progress Notes (Signed)
 Endocrinology Follow Up Note                                         11/19/2023, 10:20 AM   TELEHEALTH VISIT: The patient is being engaged in telehealth visit.  This type of visit limits physical examination significantly, and thus is not preferable over face-to-face encounters.  I connected with  Olivia Shepard on 11/19/23 by a video enabled telemedicine application and verified that I am speaking with the correct person using two identifiers.   I discussed the limitations of evaluation and management by telemedicine. The patient expressed understanding and agreed to proceed.    The participants involved in this visit include: Olivia JINNY Rio, NP located at [facility] and Olivia Shepard  located at their personal residence listed.    Subjective:   Subjective    Olivia Shepard is a 33 y.o.-year-old female patient being seen in follow up after being seen in consultation for hypothyroidism referred by Delores Suzann HERO, MD.   Past Medical History:  Diagnosis Date   Anemia    Anxiety    GERD (gastroesophageal reflux disease)    History of postpartum hemorrhage, currently pregnant    Hypothyroidism     Past Surgical History:  Procedure Laterality Date   ESOPHAGEAL DILATION      Social History   Socioeconomic History   Marital status: Married    Spouse name: Not on file   Number of children: Not on file   Years of education: Not on file   Highest education level: Not on file  Occupational History   Not on file  Tobacco Use   Smoking status: Never   Smokeless tobacco: Never  Vaping Use   Vaping status: Never Used  Substance and Sexual Activity   Alcohol use: No    Alcohol/week: 0.0 standard drinks of alcohol   Drug use: No   Sexual activity: Yes    Birth control/protection: None  Other Topics Concern   Not on file  Social History Narrative   Works as engineer, civil (consulting) on L and D at  [employer]    Has 4 children, 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   Social Drivers of Health   Financial Resource Strain: Not on file  Food Insecurity: No Food Insecurity (10/11/2022)   Hunger Vital Sign    Worried About Running Out of Food in the Last Year: Never true    Ran Out of Food in the Last Year: Never true  Transportation Needs: No Transportation Needs (10/11/2022)   PRAPARE - Administrator, Civil Service (Medical): No    Lack of Transportation (Non-Medical): No  Physical Activity: Not on file  Stress: Not on file  Social Connections: Unknown (03/10/2022)   [facility]   Social Network    Social Network: Not on file    Family History  Problem Relation Age of Onset   Diabetes Mother    Varicose Veins Father  Lung cancer Father    Lung cancer Maternal Grandmother    Varicose Veins Paternal Grandmother    Colon cancer Paternal Great-grandfather    Stomach cancer Paternal Great-grandfather    Colon cancer Paternal Great-grandmother    Esophageal cancer Neg Hx     Outpatient Encounter Medications as of 11/19/2023  Medication Sig   levothyroxine  (SYNTHROID ) 75 MCG tablet Take 1 tablet (75 mcg total) by mouth daily before breakfast.   Prenatal Vit-Fe Fumarate-FA (PRENATAL MULTIVITAMIN) TABS tablet Take 1 tablet by mouth daily.   sertraline  (ZOLOFT ) 25 MG tablet Take 25 mg by mouth daily.   triamcinolone  ointment (KENALOG ) 0.1 % Apply 1 Application topically 2 (two) times daily.   No facility-administered encounter medications on file as of 11/19/2023.    ALLERGIES: No Known Allergies VACCINATION STATUS: Immunization History  Administered Date(s) Administered   DTaP 11/10/1989   Hepatitis B 08/06/2000, 01/28/2001, 10/27/2005   Hepatitis B, PED/ADOLESCENT 08/06/2000, 08/06/2000, 01/28/2001, 01/28/2001, 10/27/2005, 10/27/2005   Influenza Split 07/28/2012   Influenza, Seasonal, Injecte, Preservative Fre 07/28/2012, 08/03/2013, 08/11/2015,  08/18/2017, 08/27/2018, 08/05/2023   Influenza,inj,Quad PF,6+ Mos 08/27/2018, 06/27/2019, 08/02/2021   Influenza,inj,Quad PF,6-35 Mos 06/27/2019   Influenza,inj,quad, With Preservative 07/28/2012, 08/03/2013, 08/11/2015, 08/18/2017, 08/27/2018   Influenza-Unspecified 07/28/2012, 08/03/2013, 08/11/2015, 08/18/2017, 06/27/2019   MMR 02/17/1991, 02/09/1995, 09/18/2016, 10/13/2022   Meningococcal Conjugate 06/27/2008   Meningococcal polysaccharide vaccine (MPSV4) 06/27/2008   PFIZER Comirnaty(Gray Top)Covid-19 Tri-Sucrose Vaccine 12/25/2020   PFIZER(Purple Top)SARS-COV-2 Vaccination 07/11/2020   PPD Test 06/14/2008, 02/03/2011, 02/13/2012   Td 04/25/2005   Tdap 02/09/1995, 08/10/2014, 08/29/2016, 04/30/2020     HPI   Olivia Shepard  is a patient with the above medical history. she was diagnosed with hypothyroidism a few years ago as she and her husband were trying to conceive a child.  She was started on Levothyroxine  25 mcg po daily as a result, now up to 75 mcg po daily.  She has been tested for thyroid  antibodies several times, negative.    -She is currently on Levothyroxine  75 mcg po daily before breakfast.  I reviewed patient's thyroid  tests:  Lab Results  Component Value Date   TSH 2.090 11/17/2023   TSH 1.010 05/15/2023   TSH 1.300 01/19/2023   TSH 0.758 10/29/2022   TSH 1.700 09/04/2022   TSH 1.320 07/18/2022   TSH 2.380 05/27/2022   TSH 2.200 04/14/2022   TSH 2.47 03/20/2022   TSH 2.29 03/03/2022   FREET4 1.35 11/17/2023   FREET4 1.25 05/15/2023   FREET4 1.23 01/19/2023   FREET4 1.55 10/29/2022   FREET4 1.11 09/04/2022   FREET4 1.19 07/18/2022   FREET4 1.27 05/27/2022   FREET4 1.18 04/14/2022   FREET4 0.98 08/31/2015     Pt denies feeling nodules in neck, hoarseness, dysphagia/odynophagia, SOB with lying down.  she does have family history of thyroid  disorders in her mom (recently diagnosed).  No family history of thyroid  cancer. No history of radiation therapy to  head or neck.  No recent use of iodine supplements.  Denies use of Biotin containing supplements.  I reviewed her chart and she also has a history of GERD (had to have esophagus stretched), anemia, anxiety.   Review of systems  Constitutional: + stable body weight,  current Body mass index is 21.93 kg/m. , no fatigue, no subjective hyperthermia, + intermittent subjective hypothermia Eyes: no blurry vision, no xerophthalmia ENT: no sore throat, no nodules palpated in throat, no dysphagia/odynophagia, no hoarseness Cardiovascular: no chest pain, no shortness of breath, no palpitations,  no leg swelling Respiratory: no cough, no shortness of breath Gastrointestinal: no nausea/vomiting/diarrhea Musculoskeletal: no muscle/joint aches Skin: no rashes, no hyperemia Neurological: no tremors, no numbness, no tingling, no dizziness Psychiatric: no depression, no anxiety   Objective:   Objective     Ht 5' 7 (1.702 m)   Wt 140 lb (63.5 kg)   BMI 21.93 kg/m  Wt Readings from Last 3 Encounters:  11/19/23 140 lb (63.5 kg)  08/05/23 139 lb (63 kg)  05/20/23 137 lb 14.4 oz (62.6 kg)    BP Readings from Last 3 Encounters:  08/05/23 105/75  05/20/23 110/60  01/22/23 104/69     Physical Exam- Telehealth- significantly limited due to nature of visit  Constitutional: Body mass index is 21.93 kg/m. , not in acute distress, normal state of mind Respiratory: Adequate breathing efforts   CMP ( most recent) CMP     Component Value Date/Time   NA 143 08/02/2021 0937   K 5.1 08/02/2021 0937   CL 103 08/02/2021 0937   CO2 26 08/02/2021 0937   GLUCOSE 68 08/02/2021 0937   GLUCOSE 71 12/27/2015 1501   BUN 18 08/02/2021 0937   CREATININE 0.67 08/02/2021 0937   CREATININE 0.55 12/27/2015 1501   CALCIUM 10.0 08/02/2021 0937   PROT 7.1 08/02/2021 0937   ALBUMIN 4.8 08/02/2021 0937   AST 11 08/02/2021 0937   ALT 12 08/02/2021 0937   ALKPHOS 62 08/02/2021 0937   BILITOT 0.3 08/02/2021  0937   GFRNONAA 109 08/27/2018 1021   GFRNONAA >89 12/27/2015 1501   GFRAA 125 08/27/2018 1021   GFRAA >89 12/27/2015 1501     Diabetic Labs (most recent): No results found for: HGBA1C, MICROALBUR   Lipid Panel ( most recent) Lipid Panel     Component Value Date/Time   CHOL 144 12/27/2015 1501   TRIG 32 12/27/2015 1501   HDL 83 12/27/2015 1501   CHOLHDL 1.7 12/27/2015 1501   VLDL 6 12/27/2015 1501   LDLCALC 55 12/27/2015 1501       Lab Results  Component Value Date   TSH 2.090 11/17/2023   TSH 1.010 05/15/2023   TSH 1.300 01/19/2023   TSH 0.758 10/29/2022   TSH 1.700 09/04/2022   TSH 1.320 07/18/2022   TSH 2.380 05/27/2022   TSH 2.200 04/14/2022   TSH 2.47 03/20/2022   TSH 2.29 03/03/2022   FREET4 1.35 11/17/2023   FREET4 1.25 05/15/2023   FREET4 1.23 01/19/2023   FREET4 1.55 10/29/2022   FREET4 1.11 09/04/2022   FREET4 1.19 07/18/2022   FREET4 1.27 05/27/2022   FREET4 1.18 04/14/2022   FREET4 0.98 08/31/2015     Latest Reference Range & Units 10/29/22 11:06 01/19/23 16:20 05/15/23 16:02 11/17/23 09:42  TSH 0.450 - 4.500 uIU/mL 0.758 1.300 1.010 2.090  T4,Free(Direct) 0.82 - 1.77 ng/dL 8.44 8.76 8.74 8.64    Assessment & Plan:   ASSESSMENT / PLAN:  1. Hypothyroidism-acquired  Patient with long-standing hypothyroidism, on levothyroxine  therapy. On physical exam, patient does not have gross goiter, thyroid  nodules, or neck compression symptoms.  Her antibody testing was negative, ruling out autoimmune thyroid  dysfunction.    Her previsit thyroid  function tests are consistent with appropriate hormone replacement.  She is advised to continue Levothyroxine  75 mcg po daily before breakfast.  She is advised to reach out if she starts to develop symptoms so we can check sooner if needed.  - We discussed about correct intake of levothyroxine , at fasting, with water, separated by at least  30 minutes from breakfast, and separated by more than 4 hours from  calcium, iron, multivitamins, acid reflux medications (PPIs). -Patient is made aware of the fact that thyroid  hormone replacement is needed for life, dose to be adjusted by periodic monitoring of thyroid  function tests.   -Due to absence of clinical goiter, no need for thyroid  ultrasound at this time.    I spent 12 minutes dedicated to the care of this patient on the date of this encounter to include pre-visit review of records, face-to-face time with the patient, and post visit ordering of testing.   FOLLOW UP PLAN:  No follow-ups on file.  Olivia Shepard, [facility] [address] [place], [place] [address] Phone: [phone] Fax: [phone]  11/19/2023, 10:20 AM

## 2023-11-19 NOTE — Patient Instructions (Signed)

## 2023-11-20 ENCOUNTER — Emergency Department (HOSPITAL_COMMUNITY): Payer: Medicaid Other

## 2023-11-20 ENCOUNTER — Emergency Department (HOSPITAL_COMMUNITY)
Admission: EM | Admit: 2023-11-20 | Discharge: 2023-11-20 | Disposition: A | Discharge status: Home / Self Care | Payer: Medicaid Other | Attending: Emergency Medicine | Admitting: Emergency Medicine

## 2023-11-20 ENCOUNTER — Encounter (HOSPITAL_COMMUNITY): Payer: Self-pay

## 2023-11-20 ENCOUNTER — Other Ambulatory Visit: Payer: Self-pay

## 2023-11-20 DIAGNOSIS — M79605 Pain in left leg: Secondary | ICD-10-CM

## 2023-11-20 DIAGNOSIS — K5641 Fecal impaction: Secondary | ICD-10-CM | POA: Diagnosis not present

## 2023-11-20 DIAGNOSIS — M79662 Pain in left lower leg: Secondary | ICD-10-CM | POA: Insufficient documentation

## 2023-11-20 DIAGNOSIS — R1032 Left lower quadrant pain: Secondary | ICD-10-CM | POA: Insufficient documentation

## 2023-11-20 DIAGNOSIS — R200 Anesthesia of skin: Secondary | ICD-10-CM | POA: Diagnosis not present

## 2023-11-20 DIAGNOSIS — M5126 Other intervertebral disc displacement, lumbar region: Secondary | ICD-10-CM | POA: Diagnosis not present

## 2023-11-20 DIAGNOSIS — R300 Dysuria: Secondary | ICD-10-CM | POA: Insufficient documentation

## 2023-11-20 DIAGNOSIS — R202 Paresthesia of skin: Secondary | ICD-10-CM | POA: Diagnosis not present

## 2023-11-20 DIAGNOSIS — M51379 Other intervertebral disc degeneration, lumbosacral region without mention of lumbar back pain or lower extremity pain: Secondary | ICD-10-CM | POA: Diagnosis not present

## 2023-11-20 DIAGNOSIS — M79661 Pain in right lower leg: Secondary | ICD-10-CM | POA: Diagnosis not present

## 2023-11-20 LAB — HCG, SERUM, QUALITATIVE: Preg, Serum: NEGATIVE

## 2023-11-20 LAB — COMPREHENSIVE METABOLIC PANEL
ALT: 16 U/L (ref 0–44)
AST: 17 U/L (ref 15–41)
Albumin: 4.4 g/dL (ref 3.5–5.0)
Alkaline Phosphatase: 58 U/L (ref 38–126)
Anion gap: 8 (ref 5–15)
BUN: 15 mg/dL (ref 6–20)
CO2: 27 mmol/L (ref 22–32)
Calcium: 9.2 mg/dL (ref 8.9–10.3)
Chloride: 104 mmol/L (ref 98–111)
Creatinine, Ser: 0.55 mg/dL (ref 0.44–1.00)
GFR, Estimated: >60 mL/min (ref >60)
Glucose, Bld: 85 mg/dL (ref 70–99)
Potassium: 4.1 mmol/L (ref 3.5–5.1)
Sodium: 139 mmol/L (ref 135–145)
Total Bilirubin: 0.5 mg/dL (ref 0.0–1.2)
Total Protein: 7 g/dL (ref 6.5–8.1)

## 2023-11-20 LAB — LIPASE, BLOOD: Lipase: 52 U/L — ABNORMAL HIGH (ref 11–51)

## 2023-11-20 LAB — CBC
HCT: 38.1 % (ref 36.0–46.0)
Hemoglobin: 12.4 g/dL (ref 12.0–15.0)
MCH: 29 pg (ref 26.0–34.0)
MCHC: 32.5 g/dL (ref 30.0–36.0)
MCV: 89.2 fL (ref 80.0–100.0)
Platelets: 173 10*3/uL (ref 150–400)
RBC: 4.27 MIL/uL (ref 3.87–5.11)
RDW: 13.2 % (ref 11.5–15.5)
WBC: 4.5 10*3/uL (ref 4.0–10.5)
nRBC: 0 % (ref 0.0–0.2)

## 2023-11-20 LAB — URINALYSIS, ROUTINE W REFLEX MICROSCOPIC
Bilirubin Urine: NEGATIVE
Glucose, UA: NEGATIVE mg/dL
Hgb urine dipstick: NEGATIVE
Ketones, ur: NEGATIVE mg/dL
Leukocytes,Ua: NEGATIVE
Nitrite: NEGATIVE
Protein, ur: NEGATIVE mg/dL
Specific Gravity, Urine: 1.026 (ref 1.005–1.030)
pH: 6 (ref 5.0–8.0)

## 2023-11-20 MED ORDER — IOHEXOL 300 MG/ML  SOLN
100.0000 mL | Freq: Once | INTRAMUSCULAR | Status: AC | PRN
  Administered 2023-11-20: 100 mL via INTRAVENOUS

## 2023-11-20 MED ORDER — POLYETHYLENE GLYCOL 3350 17 GM/SCOOP PO POWD: 1.0000 | Freq: Once | ORAL | 0 refills | Status: AC

## 2023-11-20 NOTE — Discharge Instructions (Addendum)
 You were seen today for bilateral lower leg pain and numbness.  Your CTs were very reassuring but did show the bulging disc that you mention earlier and a large stool burden.  For this reason I am prescribing MiraLAX  and when he needs to follow-up with Ortho to have this further evaluated.  No emergent pathology is present at this time.  Recommend that you continue to take the Tylenol  and ibuprofen  to help with pain in the interim.  Recommend you also use Salonpas to help out with areas that are particularly painful.  Take Tylenol  (acetominophen)  650mg  every 4-6 hours, as needed for pain or fever. Do not take more than 4,000 mg in a 24-hour period. As this may cause liver damage. While this is rare, if you begin to develop yellowing of the skin or eyes, stop taking and return to ER immediately.  Take Ibuprofen  400mg  every 4-6 hours for pain or fever, not exceeding 3,200 mg per day as more than 3,200mg  can cause Stomach irritation, dizziness, kidney issues with long-term use.  If you have been experiencing fever, nausea, vomiting, worsening abdominal pain, weakness, chest pain, shortness of breath return to the ER for further evaluation.  It was a pleasure seeing you in the ER today.

## 2023-11-20 NOTE — ED Triage Notes (Signed)
 Pt arrives via POV. Pt reports ongoing pain and numbness that radiates down both legs that has worsened over the past week. Pt has been through PT for the same in the past. Denies any injury. Pt reports she also is able to feel a mass in her left lower abdomen. Pt is AxOx4.

## 2023-11-20 NOTE — ED Provider Notes (Signed)
 [place] EMERGENCY DEPARTMENT AT [facility] Provider Note   CSN: [id number] Arrival date & time: 11/20/23  1033     History  Chief Complaint  Patient presents with   Leg Pain   Numbness    Olivia Shepard is a 33 y.o. female.   Leg Pain Associated symptoms: back pain    Patient presents to the ED with bilateral lower leg pain, numbness has been happening for the last 3 days.  Previous history of a slipped disc a year ago varicose veins, chronic UTI.  Currently nursing.  Had previously undergone pelvic floor PT and helped with pain but is currently unrelieved despite TENS unit, massage, Tylenol , ibuprofen .  Patient states that she now has a left lower abdominal pain, nodule that she can feel.  Endorses nausea with no vomiting.  Normal bowel movements.  Endorses dysuria with chronic UTI.  States that when pressing down on the nodule it has shooting pain to left side of her leg.    Home Medications Prior to Admission medications   Medication Sig Start Date End Date Taking? Authorizing Provider  levothyroxine  (SYNTHROID ) 75 MCG tablet Take 1 tablet (75 mcg total) by mouth daily before breakfast. 11/19/23   Therisa Benton PARAS, NP  polyethylene glycol powder (MIRALAX ) 17 GM/SCOOP powder Take 255 g by mouth once for 1 dose. 11/20/23 11/20/23 Yes Beola Terrall RAMAN, PA-C  Prenatal Vit-Fe Fumarate-FA (PRENATAL MULTIVITAMIN) TABS tablet Take 1 tablet by mouth daily.    [provider]  sertraline  (ZOLOFT ) 25 MG tablet Take 25 mg by mouth daily. 12/26/22   [provider]  triamcinolone  ointment (KENALOG ) 0.1 % Apply 1 Application topically 2 (two) times daily. 10/21/23   Delores Suzann HERO, MD      Allergies    Patient has no known allergies.    Review of Systems   Review of Systems  Musculoskeletal:  Positive for back pain.  Neurological:  Positive for numbness.  All other systems reviewed and are negative.   Physical Exam Updated Vital Signs BP 109/75 (BP  Location: Right Arm)   Pulse 78   Temp 99.1 F (37.3 C) (Oral)   Resp 18   Ht 5' 7 (1.702 m)   Wt 63.5 kg   SpO2 100%   BMI 21.93 kg/m  Physical Exam Vitals and nursing note reviewed.  Constitutional:      Appearance: Normal appearance.  HENT:     Head: Normocephalic and atraumatic.  Eyes:     General: No scleral icterus.       Right eye: No discharge.        Left eye: No discharge.     Extraocular Movements: Extraocular movements intact.     Conjunctiva/sclera: Conjunctivae normal.  Cardiovascular:     Rate and Rhythm: Normal rate and regular rhythm.     Pulses: Normal pulses.     Heart sounds: Normal heart sounds. No murmur heard.    No friction rub. No gallop.  Pulmonary:     Effort: Pulmonary effort is normal. No respiratory distress.     Breath sounds: Normal breath sounds.  Abdominal:     General: Abdomen is flat.     Palpations: Abdomen is soft. There is mass (2 x 2 cm nodule noted on the left lower quadrant.  When palpating it exacerbates the pain, numbness, left leg).     Tenderness: There is no abdominal tenderness.  Musculoskeletal:        General: No swelling, tenderness, deformity  or signs of injury.     Right lower leg: No edema.     Left lower leg: No edema.  Skin:    General: Skin is warm and dry.     Coloration: Skin is not jaundiced or pale.     Findings: No erythema.  Neurological:     General: No focal deficit present.     Mental Status: She is alert and oriented to person, place, and time. Mental status is at baseline.     Sensory: No sensory deficit.     Motor: No weakness.     Coordination: Coordination normal.     Gait: Gait normal.  Psychiatric:        Mood and Affect: Mood normal.     ED Results / Procedures / Treatments   Labs (all labs ordered are listed, but only abnormal results are displayed) Labs Reviewed  LIPASE, BLOOD - Abnormal; Notable for the following components:      Result Value   Lipase 52 (*)    All other  components within normal limits  COMPREHENSIVE METABOLIC PANEL  CBC  URINALYSIS, ROUTINE W REFLEX MICROSCOPIC  HCG, SERUM, QUALITATIVE    EKG None  Radiology CT L-SPINE NO CHARGE Result Date: 11/20/2023 CLINICAL DATA:  Provided history: Ongoing pain and numbness which radiates down both legs, symptoms have worsened over the past week. EXAM: CT LUMBAR SPINE WITHOUT CONTRAST TECHNIQUE: Multidetector CT imaging of the lumbar spine was performed without intravenous contrast administration. Multiplanar CT image reconstructions were also generated. RADIATION DOSE REDUCTION: This exam was performed according to the departmental dose-optimization program which includes automated exposure control, adjustment of the mA and/or kV according to patient size and/or use of iterative reconstruction technique. COMPARISON:  Lumbar spine MRI 01/15/2022. FINDINGS: Segmentation: Transitional lumbosacral anatomy. For the purposes of this dictation, the S1 vertebra is transitional. Left-sided assimilation joint and small (but fairly well-formed) disc space at S1-S2. Tiny rudimentary rib on the left at L1. Spinal numbering remains consistent with that utilized on the prior lumbar spine MRI of 01/15/2022. Which Alignment: Levocurvature of the lumbar and visualized lower thoracic spine. No significant spondylolisthesis. Vertebrae: Vertebral body height is maintained. No evidence of a lumbar spine fracture. Paraspinal and other soft tissues: No paraspinal mass or collection. Please refer to the concurrently performed CT abdomen/pelvis for a description of abdominopelvic soft tissue findings. Disc levels: Unless otherwise stated, the level by level findings below have not significantly changed from the prior MRI of 01/15/2022. No more than mild disc degeneration at the lumbar or visualized lower thoracic levels. T11-T12: This level is imaged in the sagittal plane only. No evidence of significant disc herniation, spinal canal  stenosis or neural foraminal narrowing. T12-L1: No evidence of significant disc herniation, spinal canal stenosis or neural foraminal narrowing. L1-L2: No evidence of significant disc herniation, spinal canal stenosis or neural foraminal narrowing. L2-L3: No evidence of significant disc herniation, spinal canal stenosis or neural foraminal narrowing. L3-L4: No evidence of significant disc herniation, spinal canal stenosis or neural foraminal narrowing. L4-L5: No evidence of significant disc herniation, spinal canal stenosis or neural foraminal narrowing. L5-S1: Disc bulge. Superimposed broad-based central/left subarticular disc protrusion (series 2, image 97). The disc protrusion mildly effaces the ventral thecal sac and results in mild left subarticular narrowing. No more than mild neural foraminal narrowing at this level. S1-S2: Transitional level. No evidence of significant disc herniation, spinal canal stenosis or neural foraminal narrowing. IMPRESSION: 1. Transitional lumbosacral anatomy as described. 2. No more  than mild disc degeneration at the lumbar and visualized lower thoracic levels. 3. At L5-S1, there is a disc bulge. Superimposed broad-based central/left subarticular disc protrusion. The disc protrusion mildly effaces the ventral thecal sac and results in mild left subarticular narrowing. No more than mild neural foraminal narrowing. 4. No evidence of significant disc herniation, spinal canal stenosis or neural foraminal narrowing at the remaining levels. 5. Levocurvature of the lumbar and visualized lower thoracic spine. Electronically Signed   By: Rockey Childs D.O.   On: 11/20/2023 12:59   CT ABDOMEN PELVIS W CONTRAST Result Date: 11/20/2023 CLINICAL DATA:  Pain numbness and tingling down both legs for the last week. EXAM: CT ABDOMEN AND PELVIS WITH CONTRAST TECHNIQUE: Multidetector CT imaging of the abdomen and pelvis was performed using the standard protocol following bolus administration of  intravenous contrast. RADIATION DOSE REDUCTION: This exam was performed according to the departmental dose-optimization program which includes automated exposure control, adjustment of the mA and/or kV according to patient size and/or use of iterative reconstruction technique. CONTRAST:  OMNIPAQUE  IOHEXOL  300 MG/ML  SOLN COMPARISON:  None Available. FINDINGS: Lower chest: Mild linear opacity lung bases dependently with some ground-glass. Favor atelectasis. No pleural effusion seen at the lung bases. Some breathing motion. Hepatobiliary: No focal liver abnormality is seen. No gallstones, gallbladder wall thickening, or biliary dilatation. Patent portal vein. Pancreas: Unremarkable. No pancreatic ductal dilatation or surrounding inflammatory changes. Spleen: Normal in size without focal abnormality. Adrenals/Urinary Tract: Adrenal glands are unremarkable. Kidneys are normal, without renal calculi, focal lesion, or hydronephrosis. Bladder is unremarkable. Stomach/Bowel: On this non oral contrast exam large bowel has a normal course and caliber with diffuse colonic stool. Cecum resides in the central pelvis above the bladder. Normal retrocecal appendix best seen on coronal series 8, image 69. Mild debris in the stomach with some fluid. Small bowel is nondilated. Vascular/Lymphatic: No significant vascular findings are present. No enlarged abdominal or pelvic lymph nodes. Reproductive: Uterus and bilateral adnexa are unremarkable. Other: No abdominal wall hernia or abnormality. No abdominopelvic ascites. Musculoskeletal: Slight curvature of the spine. Partial sacralization left side of L5 with some sclerosis. This can be a source of discomfort. Please see separate dictation of the lumbar spine CT. IMPRESSION: Diffuse moderate colonic stool. No bowel obstruction, free air or free fluid. Normal appendix. Electronically Signed   By: Ranell Bring M.D.   On: 11/20/2023 12:50    Procedures Procedures     Medications Ordered in ED Medications  iohexol  (OMNIPAQUE ) 300 MG/ML solution 100 mL (100 mLs Intravenous Contrast Given 11/20/23 1222)    ED Course/ Medical Decision Making/ A&P                                 Medical Decision Making Amount and/or Complexity of Data Reviewed Labs: ordered. Radiology: ordered.   This patient is a 33 year old female who presents to the ED for concern of bilateral lower leg pain, numbness and tingling presenting with left lower quadrant abdominal pain and mass.   Differential diagnoses prior to evaluation: The emergent differential diagnosis includes, but is not limited to, sciatica, large stool burden, tumor, varicose veins, neuropathy. This is not an exhaustive differential.   Past Medical History / Co-morbidities / Social History: GERD, chronic UTI, IBS, varicose veins.  Additional history: Chart reviewed. Pertinent results include: Previously treated for chronic knee pain undergoing PT.  Lab Tests/Imaging studies: I personally interpreted labs/imaging and the  pertinent results include:   CMP unremarkable  CBC unremarkable UA unremarkable Pregnancy test negative Lipase unremarkable  CT abdomen pelvis shows large stool burden but otherwise unremarkable I agree with the radiologist interpretation. CT lumbar spine showedAt L5-S1, there is a disc bulge. Superimposed broad-based central/left subarticular disc protrusion. The disc protrusion mildly effaces the ventral thecal sac and results in mild left subarticular narrowing. No more than mild neural foraminal narrowing.  And agree with radiologist interpretation.   Medications: I ordered medication including MiraLAX .  I have reviewed the patients home medicines and have made adjustments as needed.  ED Course:  Patient is a 33 year old female presents ED with bilateral lower leg pain and left-sided numbness and tingling.  She states this has begun 3 days ago and has been consistent  getting worse and now feels a mass in her left lower quadrant..  States that she has been previously undergoing COVID for PT which had helped but is now currently having numbness and tingling in both feet.  Denies incontinence, fever, constipation, weakness, gait instability.  Physical exam was notable for left lower quadrant mass that when palpated expressed mild tenderness and exacerbated the tingling down the left side of her leg.  No trauma.  Physical exam was otherwise unremarkable. Labs were benign, vital signs stable CT abdomen showed large stool burden but otherwise unremarkable CT lumbar spine showedAt L5-S1, there is a disc bulge. Superimposed broad-based central/left subarticular disc protrusion. The disc protrusion mildly effaces the ventral thecal sac and results in mild left subarticular narrowing. No more than mild neural foraminal narrowing.  For this reason, we will have patient follow-up with Ortho for further workup and prescribed MiraLAX  to help with removal of stool burden.  Provided strict return to ER precautions.  Patient expressed agreement understanding with plan.      Disposition: After consideration of the diagnostic results and the patients response to treatment, I feel that patient benefit from discharge and treatment as above.   emergency department workup does not suggest an emergent condition requiring admission or immediate intervention beyond what has been performed at this time. The plan is: Symptomatic treatment, follow-up with Ortho, MiraLAX  for constipation, return for new or worsening symptoms. The patient is safe for discharge and has been instructed to return immediately for worsening symptoms, change in symptoms or any other concerns.   Final Clinical Impression(s) / ED Diagnoses Final diagnoses:  Pain in both lower extremities    Rx / DC Orders ED Discharge Orders          Ordered    polyethylene glycol powder (MIRALAX ) 17 GM/SCOOP powder   Once         11/20/23 1321              Beola Terrall RAMAN, PA-C 11/20/23 1326    Elnor Savant A, DO 11/20/23 1549

## 2023-11-23 ENCOUNTER — Ambulatory Visit: Payer: Medicaid Other | Admitting: Nurse Practitioner

## 2023-11-24 ENCOUNTER — Encounter: Payer: Self-pay | Admitting: Orthopaedic Surgery

## 2023-11-24 ENCOUNTER — Ambulatory Visit (INDEPENDENT_AMBULATORY_CARE_PROVIDER_SITE_OTHER): Payer: Medicaid Other | Admitting: Orthopaedic Surgery

## 2023-11-24 VITALS — BP 113/67 | HR 77

## 2023-11-24 DIAGNOSIS — M5126 Other intervertebral disc displacement, lumbar region: Secondary | ICD-10-CM | POA: Diagnosis not present

## 2023-11-24 MED ORDER — PREDNISONE 10 MG (21) PO TBPK: ORAL_TABLET | ORAL | 0 refills | Status: DC

## 2023-11-24 NOTE — Progress Notes (Signed)
 Office Visit Note   Patient: Olivia Shepard           Date of Birth: 1991-06-18           MRN: [id number] Visit Date: 11/24/2023              Requested by: Delores Suzann HERO, MD [address] [place],  [place] [address] PCP: Delores Suzann HERO, MD   Assessment & Plan: Visit Diagnoses:  1. Protrusion of lumbar intervertebral disc     Plan: Patient question whether she needs a shot.  Patient's is currently breast-feeding.  We discussed options recommended prednisone  Dosepak which she can take.  Reviewed CT imaging that was done in the lumbar spine which did show some disc protrusion.  Motor is neurologically intact.  We discussed options including epidurals and, indications, risks etc.  Follow-Up Instructions: No follow-ups on file.   Orders:  No orders of the defined types were placed in this encounter.  Meds ordered this encounter  Medications   predniSONE  (STERAPRED UNI-PAK 21 TAB) 10 MG (21) TBPK tablet    Sig: Take 6,5,4,3,2,1 one tablet less each day , with food    Dispense:  21 tablet    Refill:  0      Procedures: No procedures performed   Clinical Data: No additional findings.   Subjective: Chief Complaint  Patient presents with   Lower Back - Pain    HPI 33 year old female with chronic low back pain.  Last seen 02/07/2022.  She has seen a chiropractor has been through therapy, water aerobics.  She has a total of 8 shoulder now.  Patient states she has some pain and numbness in her legs and radiates to her toes.  Some pain was in her left hip she has had some difficulty sleeping.  She has used ibuprofen  and Tylenol .  She was concerned she might have a UTI had a UA which was negative.  Recent ER visit with problems with abdominal tenderness lower quadrant which significantly improved and then resolved with MiraLAX .  CT abdomen and pelvis and lumbar CT performed and results were reviewed from 11/20/2023.  This shows disc bulge at L5-S1 central and left.  Lumbar curvature  again noted.  Review of Systems no chills or fever.  All other systems noncontributory to HPI.   Objective: Vital Signs: BP 113/67   Pulse 77   Physical Exam Constitutional:      Appearance: She is well-developed.  HENT:     Head: Normocephalic.     Right Ear: External ear normal.     Left Ear: External ear normal. There is no impacted cerumen.  Eyes:     Pupils: Pupils are equal, round, and reactive to light.  Neck:     Thyroid : No thyromegaly.     Trachea: No tracheal deviation.  Cardiovascular:     Rate and Rhythm: Normal rate.  Pulmonary:     Effort: Pulmonary effort is normal.  Abdominal:     Palpations: Abdomen is soft.  Musculoskeletal:     Cervical back: No rigidity.  Skin:    General: Skin is warm and dry.  Neurological:     Mental Status: She is alert and oriented to person, place, and time.  Psychiatric:        Behavior: Behavior normal.     Ortho Exam anterior tib gastrocsoleus is intact dorsiflexion plantarflexion intact normal heel-toe gait.  Specialty Comments:  No specialty comments available.  Imaging: LINICAL DATA:  Provided  history: Ongoing pain and numbness which radiates down both legs, symptoms have worsened over the past week.   EXAM: CT LUMBAR SPINE WITHOUT CONTRAST   TECHNIQUE: Multidetector CT imaging of the lumbar spine was performed without intravenous contrast administration. Multiplanar CT image reconstructions were also generated.   RADIATION DOSE REDUCTION: This exam was performed according to the departmental dose-optimization program which includes automated exposure control, adjustment of the mA and/or kV according to patient size and/or use of iterative reconstruction technique.   COMPARISON:  Lumbar spine MRI 01/15/2022.   FINDINGS: Segmentation: Transitional lumbosacral anatomy. For the purposes of this dictation, the S1 vertebra is transitional. Left-sided assimilation joint and small (but fairly well-formed) disc  space at S1-S2. Tiny rudimentary rib on the left at L1. Spinal numbering remains consistent with that utilized on the prior lumbar spine MRI of 01/15/2022.   Which   Alignment: Levocurvature of the lumbar and visualized lower thoracic spine. No significant spondylolisthesis.   Vertebrae: Vertebral body height is maintained. No evidence of a lumbar spine fracture.   Paraspinal and other soft tissues: No paraspinal mass or collection. Please refer to the concurrently performed CT abdomen/pelvis for a description of abdominopelvic soft tissue findings.   Disc levels:   Unless otherwise stated, the level by level findings below have not significantly changed from the prior MRI of 01/15/2022.   No more than mild disc degeneration at the lumbar or visualized lower thoracic levels.   T11-T12: This level is imaged in the sagittal plane only. No evidence of significant disc herniation, spinal canal stenosis or neural foraminal narrowing.   T12-L1: No evidence of significant disc herniation, spinal canal stenosis or neural foraminal narrowing.   L1-L2: No evidence of significant disc herniation, spinal canal stenosis or neural foraminal narrowing.   L2-L3: No evidence of significant disc herniation, spinal canal stenosis or neural foraminal narrowing.   L3-L4: No evidence of significant disc herniation, spinal canal stenosis or neural foraminal narrowing.   L4-L5: No evidence of significant disc herniation, spinal canal stenosis or neural foraminal narrowing.   L5-S1: Disc bulge. Superimposed broad-based central/left subarticular disc protrusion (series 2, image 97). The disc protrusion mildly effaces the ventral thecal sac and results in mild left subarticular narrowing. No more than mild neural foraminal narrowing at this level.   S1-S2: Transitional level. No evidence of significant disc herniation, spinal canal stenosis or neural foraminal narrowing.   IMPRESSION: 1.  Transitional lumbosacral anatomy as described. 2. No more than mild disc degeneration at the lumbar and visualized lower thoracic levels. 3. At L5-S1, there is a disc bulge. Superimposed broad-based central/left subarticular disc protrusion. The disc protrusion mildly effaces the ventral thecal sac and results in mild left subarticular narrowing. No more than mild neural foraminal narrowing. 4. No evidence of significant disc herniation, spinal canal stenosis or neural foraminal narrowing at the remaining levels. 5. Levocurvature of the lumbar and visualized lower thoracic spine.     Electronically Signed   By: Rockey Childs D.O.   On: 11/20/2023 12:59     PMFS History: Patient Active Problem List   Diagnosis Date Noted   Protrusion of lumbar intervertebral disc 11/25/2023   Paronychia of second toe, left 08/08/2023   Pain in left knee 08/05/2023   Precipitous delivery, delivered (current hospitalization) 10/12/2022   Indication for care in labor and delivery, antepartum 10/11/2022   Rash 07/31/2022   Fatigue 08/02/2021   Dysphagia 11/14/2020   Pelvic floor dysfunction 10/02/2020   Varicose veins of  legs, antepartum 12/19/2019   Plantar wart, right foot 03/18/2018   GERD (gastroesophageal reflux disease) 07/25/2016   Tinea pedis of both feet 07/25/2016   Hypothyroidism 07/19/2015   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   Chronic UTI 10/24/2013   IBS (irritable bowel syndrome) 06/29/2012   Past Medical History:  Diagnosis Date   Anemia    Anxiety    GERD (gastroesophageal reflux disease)    History of postpartum hemorrhage, currently pregnant    Hypothyroidism     Family History  Problem Relation Age of Onset   Diabetes Mother    Varicose Veins Father    Lung cancer Father    Lung cancer Maternal Grandmother    Varicose Veins Paternal Grandmother    Colon cancer Paternal Great-grandfather    Stomach cancer Paternal Great-grandfather    Colon cancer  Paternal Great-grandmother    Esophageal cancer Neg Hx     Past Surgical History:  Procedure Laterality Date   ESOPHAGEAL DILATION     Social History   Occupational History   Not on file  Tobacco Use   Smoking status: Never   Smokeless tobacco: Never  Vaping Use   Vaping status: Never Used  Substance and Sexual Activity   Alcohol use: No    Alcohol/week: 0.0 standard drinks of alcohol   Drug use: No   Sexual activity: Yes    Birth control/protection: None

## 2023-11-25 DIAGNOSIS — M5126 Other intervertebral disc displacement, lumbar region: Secondary | ICD-10-CM | POA: Insufficient documentation

## 2023-12-01 ENCOUNTER — Ambulatory Visit: Payer: Medicaid Other | Admitting: Obstetrics and Gynecology

## 2023-12-02 ENCOUNTER — Encounter: Payer: Self-pay | Admitting: Obstetrics and Gynecology

## 2023-12-02 ENCOUNTER — Ambulatory Visit: Payer: Medicaid Other | Admitting: Obstetrics and Gynecology

## 2023-12-02 ENCOUNTER — Ambulatory Visit (INDEPENDENT_AMBULATORY_CARE_PROVIDER_SITE_OTHER): Payer: Medicaid Other | Admitting: Obstetrics and Gynecology

## 2023-12-02 VITALS — BP 107/74 | HR 80

## 2023-12-02 DIAGNOSIS — M62838 Other muscle spasm: Secondary | ICD-10-CM | POA: Diagnosis not present

## 2023-12-02 DIAGNOSIS — N946 Dysmenorrhea, unspecified: Secondary | ICD-10-CM | POA: Diagnosis not present

## 2023-12-02 MED ORDER — CYCLOBENZAPRINE HCL 5 MG PO TABS: 5.0000 mg | ORAL_TABLET | Freq: Three times a day (TID) | ORAL | 1 refills | Status: DC | PRN

## 2023-12-02 NOTE — Assessment & Plan Note (Addendum)
-   We discussed that levator muscle spasm may be more of a result of her herniated disc/ back pain. Encouraged her to follow up with Ortho to discuss next steps since she has taken the oral steroid. Note had mentioned that epidural injections may be a consideration.  - Will prescribe flexeril- can take up to three times a day.  - Referral placed to pelvic PT as this has previously helped her symptoms.  - Also reviewed the option of trigger point injections. Normally done with bupivicaine and kenalog but can hold the kenalog since she recently completed the oral steroids. She wants to proceed. Will plan for a few weekly injections to start.

## 2023-12-02 NOTE — Progress Notes (Signed)
[facility] Return Visit  SUBJECTIVE  History of Present Illness: Olivia Shepard is a 33 y.o. female seen in follow-up for pelvic pain, last seen 03/2022. Pt has since had a vaginal delivery in Dec 2023 (no lacerations). She had seen pelvic PT before and after delivery.   She reports that she did see improvement with pelvic PT. But after 3 months of stopping pain had returned. Has been using tens unit and manual release as well as a massage gun.   She has more pain when she is on her period. Had her first period last week since having the baby. She took a flexeril and that helped. She is breastfeeding. Has not been on COCs since she was in her 51s- stopped due to severe migraines. Currently using condoms for contraception.   Recently seen in ED for back/ leg pain and was noted to have L5/S1 disc protrusion on CT. At the time, she did have difficulty feeling her toes. Seen by ortho and given steroid dose pack. She has seen some improvement since taking that.   She has some urgency leakage but not happening often. Not having any symptoms of prolapse.   Past Medical History: Patient  has a past medical history of Anemia, Anxiety, GERD (gastroesophageal reflux disease), History of postpartum hemorrhage, currently pregnant, Hypothyroidism, and Ruptured disk.   Past Surgical History: She  has a past surgical history that includes Esophageal dilation.   Medications: She has a current medication list which includes the following prescription(s): cyclobenzaprine, levothyroxine, prenatal multivitamin, sertraline, and triamcinolone ointment.   Allergies: Patient has no known allergies.   Social History: Patient  reports that she has never smoked. She has never used smokeless tobacco. She reports that she does not drink alcohol and does not use drugs.     OBJECTIVE     Physical Exam: Vitals:   12/02/23 1317  BP: 107/74  Pulse: 80   Gen: No apparent distress, A&O x  3.  Detailed Urogynecologic Evaluation:  Normal external genitalia. On digital exam, increased pelvic floor muscle tension with multiple trigger points biltaterally, L >R. Palpation does reproduce some of the pain that radiates down her legs.    ASSESSMENT AND PLAN    Olivia Shepard is a 33 y.o. with:  1. Levator spasm   2. Dysmenorrhea     Levator spasm Assessment & Plan: - We discussed that levator muscle spasm may be more of a result of her herniated disc/ back pain. Encouraged her to follow up with Ortho to discuss next steps since she has taken the oral steroid. Note had mentioned that epidural injections may be a consideration.  - Will prescribe flexeril- can take up to three times a day.  - Referral placed to pelvic PT as this has previously helped her symptoms.  - Also reviewed the option of trigger point injections. Normally done with bupivicaine and kenalog but can hold the kenalog since she recently completed the oral steroids. She wants to proceed. Will plan for a few weekly injections to start.   Orders: -     Cyclobenzaprine HCl; Take 1 tablet (5 mg total) by mouth 3 (three) times daily as needed for muscle spasms.  Dispense: 30 tablet; Refill: 1 -     AMB referral to rehabilitation  Dysmenorrhea Assessment & Plan: - Has exacerbation in symptoms with periods. Unclear if this is due to increased muscle tension or other cause such as endometriosis. No currently on any hormonal contraception and cannot take  COCs due to severe migraines.  - Normal uterus and adnexa on recent CT A/P  - Will refer to [facility] specialist Dr Briscoe Deutscher for her opinion  Orders: -     Ambulatory referral to Obstetrics / Gynecology     Olivia Beards, MD

## 2023-12-02 NOTE — Assessment & Plan Note (Signed)
-   Has exacerbation in symptoms with periods. Unclear if this is due to increased muscle tension or other cause such as endometriosis. No currently on any hormonal contraception and cannot take COCs due to severe migraines.  - Normal uterus and adnexa on recent CT A/P  - Will refer to [facility] specialist Dr Briscoe Deutscher for her opinion

## 2023-12-04 NOTE — Progress Notes (Unsigned)
    SUBJECTIVE:   CHIEF COMPLAINT: back pain HPI:   Olivia Shepard is a 33 y.o.  with history notable for thyroid disease presenting for back pain. Also desires annual exam today. Has 4 of her own kids, 4 children through adoption.   She reports bilateral leg pain, coming from low back. Has been seen in ED, by Chilton Si, and Dr. Ophelia Charter for this. Started without specific trigger or trauma. Has associated numbness in both feet. No bowel/bladder changes. This can be triggered by levator muscle activation with Uro-Gyn. Has constant pain at this point. Improved by Flexeril. Diclofenac helped a bit, ibuprofen does not touch pain. Has 8 kids at home and still breastfeeding.   She reports 2 friends around her age recently diagnosed with breast cancer. No family of breast or ovarian cancer in her family. No breast changes noted.   PERTINENT  PMH / PSH/Family/Social History :   Updated family history   OBJECTIVE:   BP 104/65 (BP Location: Left Arm, Patient Position: Sitting, Cuff Size: Normal)   Pulse 79   Ht 5\' 7"  (1.702 m)   Wt 142 lb 3.2 oz (64.5 kg)   SpO2 100%   BMI 22.27 kg/m   Today's weight:  Last Weight  Most recent update: 12/07/2023 11:20 AM    Weight  64.5 kg (142 lb 3.2 oz)            Review of prior weights: [employer]   12/07/23 1119  Weight: 142 lb 3.2 oz (64.5 kg)   RRR Lungs clear Abdomen soft, no masses Back exam Constant pain, not worse with SLR 2+ patellar reflexes 1+ achilles Slight difference in sensation on L>R over L5 dermatome GU Exam:    External exam: Normal-appearing female external genitalia.  Vaginal exam notable for Normal exam.  Cervix without discharge or obvious lesion.  Chaperoned examine, CMA [place].   ASSESSMENT/PLAN:   Assessment & Plan Protrusion of lumbar intervertebral disc Most consistent with disc disease Already established with Spine surgery May have epidural injection in next few weeks NO progression of exam findings from  Dr. Bonnita Hollow exam Mobic 7.5 mg daily  Annual physical exam Pap today Routine labs today  Doing well with balancing life/kids UTD on vaccines  Discussed indications for early breast cancer screening, offered referral to Oncology Genetics to discuss if interested    Terisa Starr, MD  Family Medicine Teaching Service  [facility]

## 2023-12-07 ENCOUNTER — Ambulatory Visit (INDEPENDENT_AMBULATORY_CARE_PROVIDER_SITE_OTHER): Payer: Medicaid Other | Admitting: Family Medicine

## 2023-12-07 ENCOUNTER — Encounter: Payer: Self-pay | Admitting: Family Medicine

## 2023-12-07 ENCOUNTER — Other Ambulatory Visit (HOSPITAL_COMMUNITY)
Admission: RE | Admit: 2023-12-07 | Discharge: 2023-12-07 | Disposition: A | Discharge status: Home / Self Care | Payer: Medicaid Other | Source: Ambulatory Visit | Attending: Family Medicine | Admitting: Family Medicine

## 2023-12-07 VITALS — BP 104/65 | HR 79 | Ht 67.0 in | Wt 142.2 lb

## 2023-12-07 DIAGNOSIS — M5126 Other intervertebral disc displacement, lumbar region: Secondary | ICD-10-CM | POA: Diagnosis not present

## 2023-12-07 DIAGNOSIS — Z0000 Encounter for general adult medical examination without abnormal findings: Secondary | ICD-10-CM | POA: Diagnosis present

## 2023-12-07 DIAGNOSIS — Z1151 Encounter for screening for human papillomavirus (HPV): Secondary | ICD-10-CM | POA: Insufficient documentation

## 2023-12-07 DIAGNOSIS — Z124 Encounter for screening for malignant neoplasm of cervix: Secondary | ICD-10-CM | POA: Diagnosis not present

## 2023-12-07 MED ORDER — MELOXICAM 7.5 MG PO TABS: 7.5000 mg | ORAL_TABLET | Freq: Every day | ORAL | 0 refills | Status: DC

## 2023-12-07 NOTE — Assessment & Plan Note (Signed)
Most consistent with disc disease Already established with Spine surgery May have epidural injection in next few weeks NO progression of exam findings from Dr. Bonnita Hollow exam Mobic 7.5 mg daily

## 2023-12-07 NOTE — Patient Instructions (Signed)
It was wonderful to see you today.  Please bring ALL of your medications with you to every visit.   Today we talked about:  - I will message you with results of your pap  I will message you about blood work  For your back I sent in Mobic--take 1 time a day with food DO NOT TAKE IBUPROFEN WITH IT  If your back pain is not improved, please call     Please follow up in 12 months   Thank you for choosing [facility].   Please call [phone] with any questions about today's appointment.  Please be sure to schedule follow up at the front  desk before you leave today.   Terisa Starr, MD  Family Medicine

## 2023-12-08 ENCOUNTER — Encounter: Payer: Self-pay | Admitting: Family Medicine

## 2023-12-08 LAB — LIPID PANEL
Chol/HDL Ratio: 2.3 {ratio} (ref 0.0–4.4)
Cholesterol, Total: 196 mg/dL (ref 100–199)
HDL: 87 mg/dL (ref >39)
LDL Chol Calc (NIH): 99 mg/dL (ref 0–99)
Triglycerides: 53 mg/dL (ref 0–149)
VLDL Cholesterol Cal: 10 mg/dL (ref 5–40)

## 2023-12-08 LAB — HEMOGLOBIN A1C
Est. average glucose Bld gHb Est-mCnc: 100 mg/dL
Hgb A1c MFr Bld: 5.1 % (ref 4.8–5.6)

## 2023-12-08 LAB — CBC
Hematocrit: 39.9 % (ref 34.0–46.6)
Hemoglobin: 13.4 g/dL (ref 11.1–15.9)
MCH: 29.3 pg (ref 26.6–33.0)
MCHC: 33.6 g/dL (ref 31.5–35.7)
MCV: 87 fL (ref 79–97)
Platelets: 204 10*3/uL (ref 150–450)
RBC: 4.57 x10E6/uL (ref 3.77–5.28)
RDW: 13 % (ref 11.7–15.4)
WBC: 4.2 10*3/uL (ref 3.4–10.8)

## 2023-12-08 LAB — BASIC METABOLIC PANEL
BUN/Creatinine Ratio: 25 — ABNORMAL HIGH (ref 9–23)
BUN: 16 mg/dL (ref 6–20)
CO2: 23 mmol/L (ref 20–29)
Calcium: 9.7 mg/dL (ref 8.7–10.2)
Chloride: 102 mmol/L (ref 96–106)
Creatinine, Ser: 0.63 mg/dL (ref 0.57–1.00)
Glucose: 77 mg/dL (ref 70–99)
Potassium: 3.9 mmol/L (ref 3.5–5.2)
Sodium: 141 mmol/L (ref 134–144)
eGFR: 119 mL/min/{1.73_m2} (ref >59)

## 2023-12-09 ENCOUNTER — Encounter: Payer: Self-pay | Admitting: Obstetrics and Gynecology

## 2023-12-09 ENCOUNTER — Ambulatory Visit: Payer: Medicaid Other | Admitting: Obstetrics and Gynecology

## 2023-12-09 VITALS — BP 100/67 | HR 83

## 2023-12-09 DIAGNOSIS — M62838 Other muscle spasm: Secondary | ICD-10-CM

## 2023-12-09 MED ORDER — BUPIVACAINE HCL 0.25 % IJ SOLN: 10.0000 mL | Freq: Once | INTRAMUSCULAR | Status: DC

## 2023-12-09 NOTE — Progress Notes (Signed)
Ms. Olivia Shepard is a 33 y.o. female who presents for levator trigger point injection.   Vitals:   12/09/23 0856  BP: 100/67  Pulse: 83     Indication(s): Levator spasm.   Informed Consent:  The alternatives, risks and benefits of the procedure were explained to the patient. Risks including, but not limited to discomfort, pain, bleeding, infection, injury to nearby structures, inability to perform the procedure, failure of the procedure were discussed.  All questions were answered and the patient elected to proceed.  Procedure:   The patient was positioned in dorsal lithotomy position.  The vaginal tissues were prepped with Hibiclens solution.  An injection of 10cc 0.25% Bupivacaine was performed in multiple in the bilateral levator muscles, a total of 4 injection sites.  Pressure was held over bleeding areas until good hemostasis was achieved.   The patient tolerated the procedure well with no apparent complications. Return 1 week.  [name], MD

## 2023-12-12 LAB — CYTOLOGY - PAP
Comment: NEGATIVE
Diagnosis: NEGATIVE
High risk HPV: NEGATIVE

## 2023-12-16 ENCOUNTER — Ambulatory Visit: Payer: Medicaid Other | Admitting: Obstetrics and Gynecology

## 2023-12-16 ENCOUNTER — Encounter: Payer: Self-pay | Admitting: Obstetrics and Gynecology

## 2023-12-16 VITALS — BP 108/73 | HR 91

## 2023-12-16 DIAGNOSIS — M62838 Other muscle spasm: Secondary | ICD-10-CM

## 2023-12-16 MED ORDER — BUPIVACAINE HCL 0.25 % IJ SOLN: 10.0000 mL | Freq: Once | INTRAMUSCULAR | Status: DC

## 2023-12-16 NOTE — Progress Notes (Signed)
 Ms. Olivia Shepard is a 32 y.o. female who presents for levator trigger point injection.   Vitals:   12/16/23 0902  BP: 108/73  Pulse: 91   Feels she had good success with the first injection- it relieved her pain for several days. Was not feeling pain down her left leg.   Indication(s): Levator spasm.   Informed Consent:  The alternatives, risks and benefits of the procedure were explained to the patient. Risks including, but not limited to discomfort, pain, bleeding, infection, injury to nearby structures, inability to perform the procedure, failure of the procedure were discussed.  All questions were answered and the patient elected to proceed.  Procedure:   The patient was positioned in dorsal lithotomy position.  The vaginal tissues were prepped with Hibiclens solution.  An injection of 10cc 0.25% Bupivacaine  was performed in multiple in the bilateral levator muscles, a total of 5 injection sites (2 on right, 3 on left).  Pressure was held over bleeding areas until good hemostasis was achieved.   The patient tolerated the procedure well with no apparent complications. Return 1 week.  Rosaline LOISE Caper, MD

## 2023-12-22 ENCOUNTER — Encounter: Payer: Self-pay | Admitting: Physical Therapy

## 2023-12-22 ENCOUNTER — Ambulatory Visit: Payer: Medicaid Other | Attending: Obstetrics and Gynecology | Admitting: Physical Therapy

## 2023-12-22 ENCOUNTER — Other Ambulatory Visit: Payer: Self-pay

## 2023-12-22 DIAGNOSIS — R279 Unspecified lack of coordination: Secondary | ICD-10-CM | POA: Insufficient documentation

## 2023-12-22 DIAGNOSIS — M6281 Muscle weakness (generalized): Secondary | ICD-10-CM | POA: Diagnosis not present

## 2023-12-22 DIAGNOSIS — R102 Pelvic and perineal pain: Secondary | ICD-10-CM | POA: Insufficient documentation

## 2023-12-22 DIAGNOSIS — R293 Abnormal posture: Secondary | ICD-10-CM | POA: Insufficient documentation

## 2023-12-22 NOTE — Therapy (Signed)
OUTPATIENT PHYSICAL THERAPY FEMALE PELVIC EVALUATION   Patient Name: Olivia Shepard MRN: [id number] DOB:[birth year], 33 y.o., female Today's Date: 12/22/2023  END OF SESSION:  PT End of Session - 12/22/23 1728     Visit Number 1    Number of Visits 8    Date for PT Re-Evaluation 01/19/24    Authorization Type Medicaid    PT Start Time 1615    PT Stop Time 1703    PT Time Calculation (min) 48 min    Activity Tolerance Patient tolerated treatment well    Behavior During Therapy WFL for tasks assessed/performed             Past Medical History:  Diagnosis Date   Anemia    Anxiety    GERD (gastroesophageal reflux disease)    History of postpartum hemorrhage, currently pregnant    Hypothyroidism    Ruptured disk    Past Surgical History:  Procedure Laterality Date   ESOPHAGEAL DILATION     Patient Active Problem List   Diagnosis Date Noted   Protrusion of lumbar intervertebral disc 11/25/2023   Dysphagia 11/14/2020   Pelvic floor dysfunction 10/02/2020   Varicose veins of legs, antepartum 12/19/2019   GERD (gastroesophageal reflux disease) 07/25/2016   Hypothyroidism 07/19/2015   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   IBS (irritable bowel syndrome) 06/29/2012    PCP: Westley Chandler, MD  REFERRING PROVIDER: [name], MD  REFERRING DIAG: [phone] (ICD-10-CM) - Levator spasm  THERAPY DIAG:  Muscle weakness (generalized) - Plan: PT plan of care cert/re-cert  Unspecified lack of coordination - Plan: PT plan of care cert/re-cert  Abnormal posture - Plan: PT plan of care cert/re-cert  Rationale for Evaluation and Treatment: Rehabilitation  ONSET DATE: 3 weeks ago, but has happened previously  SUBJECTIVE:                                                                                                                                                                                           SUBJECTIVE STATEMENT: Patient reports to PT with  shooting nerve pain that begins at the location of the sacroiliac joints and travels down the backs of both of her legs. This pain has gotten worse over the past 3 weeks. Patient has experienced similar symptoms following delivery of each of her children (5 biological, 3 adopted, 8 total). She recently adopted 3 children and has been busy with them. She also reports low back pain due to disc-related issues; Patient often picks up her child who is 73 lbs and thinks that this could be a contributing factor to low back pain.  Fluid intake: yes  PAIN:  Are you having pain? Yes NPRS scale: 4/10 Pain location: External, Deep, Bilateral, and Posterior  Pain type: aching Pain description: intermittent and sharp   Aggravating factors: as the day passes, pain gets worse  Relieving factors: nothing   PRECAUTIONS: None  RED FLAGS: None   WEIGHT BEARING RESTRICTIONS: No  FALLS:  Has patient fallen in last 6 months? 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   ACTIVITY LEVEL : walking and carrying children   PLOF: Independent  PATIENT GOALS: decrease pain   PERTINENT HISTORY:  5 vaginal deliveries, 3 adopted children Sexual abuse: No  BOWEL MOVEMENT: Pain with bowel movement: No Type of bowel movement:Type (Bristol Stool Scale) 4, Frequency 1x/day, Strain No, and Splinting no Fully empty rectum: Yes:   Leakage: No Pads: No Fiber supplement/laative Yes: miralax if not regular  URINATION: Pain with urination: Yes - some burning with urination that mimics pelvic muscle pain  Fully empty bladder: yes  Stream: Strong Frequency: wnl Leakage: no leakage unless urgency is super high and she cannot hold it   INTERCOURSE:  Ability to have vaginal penetration Yes.   Pain with intercourse: Initial Penetration, During Penetration, and Pain Interrupts Intercourse DrynessNo Climax: no issues  Marinoff Scale: 3/3  PREGNANCY: Vaginal deliveries 5 vaginal  deliveries  Tearing Yes: with 1st 3 deliveries Currently pregnant No  PROLAPSE: Pressure to the left side of pelvic floor in the past   OBJECTIVE:  Note: Objective measures were completed at Evaluation unless otherwise noted.  PATIENT SURVEYS:  PFIQ-7: 48  COGNITION: Overall cognitive status: Within functional limits for tasks assessed     SENSATION: Light touch: Appears intact  LUMBAR SPECIAL TESTS:  Single leg stance test: Positive  FUNCTIONAL TESTS:  Squat: bilateral dynamic knee valgus with loading   GAIT: Comments: mild trendelenburg gait pattern with ambulation   POSTURE: rounded shoulders and forward head   LUMBARAROM/PROM: WNL  A/PROM A/PROM  eval  Flexion   Extension   Right lateral flexion   Left lateral flexion   Right rotation   Left rotation    (Blank rows = not tested)  LOWER EXTREMITY ROM:  Active ROM Right eval Left eval  Hip flexion    Hip extension    Hip abduction    Hip adduction    Hip internal rotation    Hip external rotation    Knee flexion    Knee extension    Ankle dorsiflexion    Ankle plantarflexion    Ankle inversion    Ankle eversion     (Blank rows = not tested)  LOWER EXTREMITY MMT: 4-/5 grossly bilateral hips, bilateral knees 4/5   MMT Right eval Left eval  Hip flexion    Hip extension    Hip abduction    Hip adduction    Hip internal rotation    Hip external rotation    Knee flexion    Knee extension    Ankle dorsiflexion    Ankle plantarflexion    Ankle inversion    Ankle eversion     (Blank rows = not tested) PALPATION: General: no tenderness to palpation of adductors/hip flexors, some discomfort with pubic bone palpation  Pelvic Alignment: within normal limits   Abdominal: decreased rib mobility with inhalation                 External Perineal Exam: increased tenderness with palpation of left sided  pelvic floor musculature                              Internal Pelvic Floor: left>right  levator muscle tension, no trigger points noted, general overactivity found throughout entirety of pelvic floor musculature   Patient confirms identification and approves PT to assess internal pelvic floor and treatment Yes No emotional/communication barriers or cognitive limitation. Patient is motivated to learn. Patient understands and agrees with treatment goals and plan. PT explains patient will be examined in standing, sitting, and lying down to see how their muscles and joints work. When they are ready, they will be asked to remove their underwear so PT can examine their perineum. The patient is also given the option of providing their own chaperone as one is not provided in our facility. The patient also has the right and is explained the right to defer or refuse any part of the evaluation or treatment including the internal exam. With the patient's consent, PT will use one gloved finger to gently assess the muscles of the pelvic floor, seeing how well it contracts and relaxes and if there is muscle symmetry. After, the patient will get dressed and PT and patient will discuss exam findings and plan of care. PT and patient discuss plan of care, schedule, attendance policy and HEP activities.  PELVIC MMT:   MMT eval  Vaginal 3/5, generally increased muscle tone throughout  Internal Anal Sphincter   External Anal Sphincter   Puborectalis   Diastasis Recti   (Blank rows = not tested)        TONE: Increased muscle tension noted throughout pelvic floor musculature layers 1/2/3 bilateral   PROLAPSE: Anterior vaginal wall laxity with cough test   TODAY'S TREATMENT:                                                                                                                              DATE:   EVAL 12/22/23: Examination completed, findings reviewed, pt educated on POC, HEP, and therapeutic activities/neuro re-ed. Pt motivated to participate in PT and agreeable to attempt recommendations.   Neuro re-ed: Hooklying diaphragmatic breathing with pelvic floor musculature lengthening during inhalation2x10 breaths  Childs pose + diaphragmatic breathing  Supine butterfly stretch + diaphragmatic breathing  Therapeutic activities: Education details: pelvic floor muscle active range of motion, diaphragmatic breathing and the connection to the pelvic floor, vaginal moisturizers Manual therapy  IASTM bilateral quadratus lumborum/piriformis/lumbar musculature with attaday  PATIENT EDUCATION:  Education details: pelvic floor muscle active range of motion, diaphragmatic breathing and the connection to the pelvic floor, vaginal moisturizers  Person educated: Patient Education method: Explanation, Demonstration, Tactile cues, Verbal cues, and Handouts Education comprehension: verbalized understanding, returned demonstration, verbal cues required, and tactile cues required  HOME EXERCISE PROGRAM: Access Code: [email or web address] URL: [email or web address] Date: 12/22/2023 Prepared by: Earna Coder  Exercises - Supine Diaphragmatic Breathing  - 1 x daily -  7 x weekly - 3 sets - 10 reps - Child's Pose Stretch  - 1 x daily - 7 x weekly - 3 sets - hold - Supine Butterfly Groin Stretch  - 1 x daily - 7 x weekly - 3 sets - hold  ASSESSMENT:  CLINICAL IMPRESSION: Patient is a 33 y.o. female  who was seen today for physical therapy evaluation and treatment for pelvic pain that travels down bilateral posterior legs and is more prominent on the left side. Patient has attended pelvic PT in the past for similar symptoms and has noticed improvements. Patient attended pelvic PT after delivery of youngest child until 02/2023, during which her symptoms resolved. This pain has returned to 4/10 at rest and increases as the day progresses. Pelvic floor examination findings include pelvic floor muscle weakness, lack of coordination, and decreased rib mobility with diaphragmatic breathing. Following  tactile/verbal/visual cueing, patient was able to actively lengthen her pelvic floor with inhalation to relax the musculature and had no increase in pain following treatment. Pt would benefit from additional PT to further address deficits.   OBJECTIVE IMPAIRMENTS: decreased coordination, decreased endurance, decreased mobility, decreased ROM, decreased strength, and pain.   ACTIVITY LIMITATIONS: lifting  PARTICIPATION LIMITATIONS:  vaginal penetration   PERSONAL FACTORS: Past/current experiences and Time since onset of injury/illness/exacerbation are also affecting patient's functional outcome.   REHAB POTENTIAL: Good  CLINICAL DECISION MAKING: Stable/uncomplicated  EVALUATION COMPLEXITY: Low   GOALS: Goals reviewed with patient? Yes  SHORT TERM GOALS: Target date: 01/19/2024  Pt will be independent with HEP.  Baseline: Goal status: INITIAL  2.  Pt will be independent with diaphragmatic breathing and down training activities in order to improve pelvic floor relaxation. Baseline:  Goal status: INITIAL  3.  Pt will be able to correctly perform diaphragmatic breathing and appropriate pressure management in order to prevent worsening vaginal wall laxity and improve pelvic floor A/ROM.  Baseline:  Goal status: INITIAL  LONG TERM GOALS: Target date: 06/20/2024  Pt will be independent with advanced HEP.  Baseline:  Goal status: INITIAL  2.  Pt will demonstrate normal pelvic floor muscle tone and A/ROM, able to achieve 4/5 strength with contractions and 10 sec endurance, in order to provide appropriate lumbopelvic support in functional activities.  Baseline:  Goal status: INITIAL  3.  Pt to demonstrate improved coordination of pelvic floor and breathing mechanics with 10# squat with appropriate synergistic patterns to decrease pain at least 75% of the time.   Baseline:  Goal status: INITIAL  4.  Pt will report PFIQ-7 score of less than 24 points to suggest decreased functional  limitations secondary to pelvic pain to improve quality of life.  Baseline:  Goal status: INITIAL  PLAN:  PT FREQUENCY: 1x/week  PT DURATION: 8 weeks  PLANNED INTERVENTIONS: 97110-Therapeutic exercises, 97530- Therapeutic activity, 97112- Neuromuscular re-education, 97535- Self Care, [id number]- Manual therapy, Taping, Dry Needling, Joint mobilization, Spinal mobilization, Scar mobilization, Cryotherapy, and Moist heat  PLAN FOR NEXT SESSION: continued internal treatment to decrease left levator spasms, continued downtraining techniques  Omar Person, PT 12/22/2023, 5:35 PM

## 2023-12-23 ENCOUNTER — Ambulatory Visit: Payer: Medicaid Other | Admitting: Obstetrics and Gynecology

## 2023-12-23 ENCOUNTER — Encounter: Payer: Self-pay | Admitting: Obstetrics and Gynecology

## 2023-12-23 VITALS — BP 109/60 | HR 63

## 2023-12-23 DIAGNOSIS — M62838 Other muscle spasm: Secondary | ICD-10-CM | POA: Diagnosis not present

## 2023-12-23 MED ORDER — BUPIVACAINE HCL 0.25 % IJ SOLN: 10.0000 mL | Freq: Once | INTRAMUSCULAR | Status: DC

## 2023-12-23 NOTE — Progress Notes (Signed)
Ms. Olivia Shepard is a 33 y.o. female who presents for levator trigger point injection.   Vitals:   12/23/23 0915  BP: 109/60  Pulse: 63    Last injection gave her relief for about 3 days. She started pelvic PT yesterday.   Indication(s): Levator spasm.   Informed Consent:  The alternatives, risks and benefits of the procedure were explained to the patient. Risks including, but not limited to discomfort, pain, bleeding, infection, injury to nearby structures, inability to perform the procedure, failure of the procedure were discussed.  All questions were answered and the patient elected to proceed.  Procedure:   The patient was positioned in dorsal lithotomy position.  The vaginal tissues were prepped with Hibiclens solution.  An injection of 10cc 0.25% Bupivacaine was performed in multiple in the bilateral levator muscles, a total of 4 injection sites (1 on right, 3 on left).  Pressure was held over bleeding areas until good hemostasis was achieved.   The patient tolerated the procedure well with no apparent complications. Return 1 week.  [name], MD

## 2023-12-29 ENCOUNTER — Ambulatory Visit: Payer: Medicaid Other | Admitting: Physical Therapy

## 2023-12-29 DIAGNOSIS — M6281 Muscle weakness (generalized): Secondary | ICD-10-CM | POA: Diagnosis not present

## 2023-12-29 DIAGNOSIS — R293 Abnormal posture: Secondary | ICD-10-CM | POA: Diagnosis not present

## 2023-12-29 DIAGNOSIS — R102 Pelvic and perineal pain: Secondary | ICD-10-CM

## 2023-12-29 DIAGNOSIS — R279 Unspecified lack of coordination: Secondary | ICD-10-CM | POA: Diagnosis not present

## 2023-12-29 NOTE — Therapy (Signed)
OUTPATIENT PHYSICAL THERAPY FEMALE PELVIC TREATMENT   Patient Name: Olivia Shepard MRN: [id number] DOB:November 14, 1990, 33 y.o., female Today's Date: 12/29/2023  END OF SESSION:  PT End of Session - 12/29/23 1714     Visit Number 2    Number of Visits 8    Date for PT Re-Evaluation 01/19/24    Authorization Type Medicaid    Authorization Time Period [employer] Approved 4 visits-12/22/2023-01/20/2024    Authorization - Number of Visits 4    PT Start Time 0445    PT Stop Time 0505    PT Time Calculation (min) 20 min    Activity Tolerance Patient tolerated treatment well    Behavior During Therapy WFL for tasks assessed/performed              Past Medical History:  Diagnosis Date   Anemia    Anxiety    GERD (gastroesophageal reflux disease)    History of postpartum hemorrhage, currently pregnant    Hypothyroidism    Ruptured disk    Past Surgical History:  Procedure Laterality Date   ESOPHAGEAL DILATION     Patient Active Problem List   Diagnosis Date Noted   Protrusion of lumbar intervertebral disc 11/25/2023   Dysphagia 11/14/2020   Pelvic floor dysfunction 10/02/2020   Varicose veins of legs, antepartum 12/19/2019   GERD (gastroesophageal reflux disease) 07/25/2016   Hypothyroidism 07/19/2015   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   IBS (irritable bowel syndrome) 06/29/2012    PCP: Westley Chandler, MD  REFERRING PROVIDER: [name], MD  REFERRING DIAG: [phone] (ICD-10-CM) - Levator spasm  THERAPY DIAG:  Muscle weakness (generalized)  Unspecified lack of coordination  Abnormal posture  Pelvic pain  Rationale for Evaluation and Treatment: Rehabilitation  ONSET DATE: 3 weeks ago, but has happened previously  SUBJECTIVE:                                                                                                                                                                                           SUBJECTIVE STATEMENT: Patient is  doing well today, she arrived late to therapy because her son chipped his tooth. She has been consistent with breathing and stretching. These downtraining stretches help in the moment, but she still feels tense at other times. Patient worked in the ED yesterday and did a lot of pushing and pulling of beds, so both of her hips are hurting. 4/10 rating today. Patient fully consents to today's internal treatment.  PAIN:  12/29/23: yes in pain, 4/10  Are you having pain? Yes NPRS scale: 4/10 Pain location: External, Deep, Bilateral, and Posterior  Pain type: aching Pain description: intermittent and sharp   Aggravating factors: as the day passes, pain gets worse  Relieving factors: nothing   PRECAUTIONS: None  RED FLAGS: None   WEIGHT BEARING RESTRICTIONS: No  FALLS:  Has patient fallen in last 6 months? 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   ACTIVITY LEVEL : walking and carrying children   PLOF: Independent  PATIENT GOALS: decrease pain   PERTINENT HISTORY:  5 vaginal deliveries, 3 adopted children Sexual abuse: No  BOWEL MOVEMENT: Pain with bowel movement: No Type of bowel movement:Type (Bristol Stool Scale) 4, Frequency 1x/day, Strain No, and Splinting no Fully empty rectum: Yes:   Leakage: No Pads: No Fiber supplement/laative Yes: miralax if not regular  URINATION: Pain with urination: Yes - some burning with urination that mimics pelvic muscle pain  Fully empty bladder: yes  Stream: Strong Frequency: wnl Leakage: no leakage unless urgency is super high and she cannot hold it   INTERCOURSE:  Ability to have vaginal penetration Yes.   Pain with intercourse: Initial Penetration, During Penetration, and Pain Interrupts Intercourse DrynessNo Climax: no issues  Marinoff Scale: 3/3  PREGNANCY: Vaginal deliveries 5 vaginal deliveries  Tearing Yes: with 1st 3 deliveries Currently pregnant No  PROLAPSE: Pressure to the left  side of pelvic floor in the past   OBJECTIVE:  Note: Objective measures were completed at Evaluation unless otherwise noted.  PATIENT SURVEYS:  PFIQ-7: 48  COGNITION: Overall cognitive status: Within functional limits for tasks assessed     SENSATION: Light touch: Appears intact  LUMBAR SPECIAL TESTS:  Single leg stance test: Positive  FUNCTIONAL TESTS:  Squat: bilateral dynamic knee valgus with loading   GAIT: Comments: mild trendelenburg gait pattern with ambulation   POSTURE: rounded shoulders and forward head   LUMBARAROM/PROM: WNL  A/PROM A/PROM  eval  Flexion   Extension   Right lateral flexion   Left lateral flexion   Right rotation   Left rotation    (Blank rows = not tested)  LOWER EXTREMITY ROM:  Active ROM Right eval Left eval  Hip flexion    Hip extension    Hip abduction    Hip adduction    Hip internal rotation    Hip external rotation    Knee flexion    Knee extension    Ankle dorsiflexion    Ankle plantarflexion    Ankle inversion    Ankle eversion     (Blank rows = not tested)  LOWER EXTREMITY MMT: 4-/5 grossly bilateral hips, bilateral knees 4/5   MMT Right eval Left eval  Hip flexion    Hip extension    Hip abduction    Hip adduction    Hip internal rotation    Hip external rotation    Knee flexion    Knee extension    Ankle dorsiflexion    Ankle plantarflexion    Ankle inversion    Ankle eversion     (Blank rows = not tested) PALPATION: General: no tenderness to palpation of adductors/hip flexors, some discomfort with pubic bone palpation  Pelvic Alignment: within normal limits   Abdominal: decreased rib mobility with inhalation                 External Perineal Exam: increased tenderness with palpation of left sided pelvic floor musculature  Internal Pelvic Floor: left>right levator muscle tension, no trigger points noted, general overactivity found throughout entirety of pelvic floor  musculature   Patient confirms identification and approves PT to assess internal pelvic floor and treatment Yes No emotional/communication barriers or cognitive limitation. Patient is motivated to learn. Patient understands and agrees with treatment goals and plan. PT explains patient will be examined in standing, sitting, and lying down to see how their muscles and joints work. When they are ready, they will be asked to remove their underwear so PT can examine their perineum. The patient is also given the option of providing their own chaperone as one is not provided in our facility. The patient also has the right and is explained the right to defer or refuse any part of the evaluation or treatment including the internal exam. With the patient's consent, PT will use one gloved finger to gently assess the muscles of the pelvic floor, seeing how well it contracts and relaxes and if there is muscle symmetry. After, the patient will get dressed and PT and patient will discuss exam findings and plan of care. PT and patient discuss plan of care, schedule, attendance policy and HEP activities.  PELVIC MMT:   MMT eval  Vaginal 3/5, generally increased muscle tone throughout  Internal Anal Sphincter   External Anal Sphincter   Puborectalis   Diastasis Recti   (Blank rows = not tested)        TONE: Increased muscle tension noted throughout pelvic floor musculature layers 1/2/3 bilateral   PROLAPSE: Anterior vaginal wall laxity with cough test   TODAY'S TREATMENT:                                                                                                                              DATE:   EVAL 12/22/23: Examination completed, findings reviewed, pt educated on POC, HEP, and therapeutic activities/neuro re-ed. Pt motivated to participate in PT and agreeable to attempt recommendations.  Neuro re-ed: Hooklying diaphragmatic breathing with pelvic floor musculature lengthening during inhalation2x10  breaths  Childs pose + diaphragmatic breathing  Supine butterfly stretch + diaphragmatic breathing  Therapeutic activities: Education details: pelvic floor muscle active range of motion, diaphragmatic breathing and the connection to the pelvic floor, vaginal moisturizers Manual therapy  IASTM bilateral quadratus lumborum/piriformis/lumbar musculature with attaday  Treatment 12/29/23 Neuro re-ed: Hooklying diaphragmatic breathing with pelvic floor musculature lengthening during inhalation2x10 breaths  Childs pose + diaphragmatic breathing  Supine butterfly stretch + diaphragmatic breathing  Manual therapy  IASTM bilateral quadratus lumborum/piriformis/lumbar musculature with attaday Internal left sided levator ani muscle release and manual stretching with inhalation   PATIENT EDUCATION:  Education details: pelvic floor muscle active range of motion, diaphragmatic breathing and the connection to the pelvic floor, vaginal moisturizers  Person educated: Patient Education method: Explanation, Demonstration, Tactile cues, Verbal cues, and Handouts Education comprehension: verbalized understanding, returned demonstration, verbal cues required, and tactile cues required  HOME EXERCISE PROGRAM: Access Code:  [email or web address] URL: [email or web address] Date: 12/22/2023 Prepared by: Earna Coder  Exercises - Supine Diaphragmatic Breathing  - 1 x daily - 7 x weekly - 3 sets - 10 reps - Child's Pose Stretch  - 1 x daily - 7 x weekly - 3 sets - hold - Supine Butterfly Groin Stretch  - 1 x daily - 7 x weekly - 3 sets - hold  ASSESSMENT:  CLINICAL IMPRESSION: Patient is a 33 y.o. female  who was seen today for physical therapy treatment for pelvic pain that travels down bilateral posterior legs and is more prominent on the left side. Patient got to PT late today due to her son chipping his tooth. Her pelvic pain is not as intense today, but her hips are sore from manual labor at  work yesterday. Pt fully consents to internal treatment and she demonstrated increased muscle tension in left sided levator group. This specific palpation reproduced tingling down the patient's left leg, and her overall pain with this muscle decreased following diaphragmatic breathing and internal lengthening cues for relaxation. Patient plans to continue breathing and stretching at home. Pt would benefit from additional PT to further address deficits.   OBJECTIVE IMPAIRMENTS: decreased coordination, decreased endurance, decreased mobility, decreased ROM, decreased strength, and pain.   ACTIVITY LIMITATIONS: lifting  PARTICIPATION LIMITATIONS:  vaginal penetration   PERSONAL FACTORS: Past/current experiences and Time since onset of injury/illness/exacerbation are also affecting patient's functional outcome.   REHAB POTENTIAL: Good  CLINICAL DECISION MAKING: Stable/uncomplicated  EVALUATION COMPLEXITY: Low   GOALS: Goals reviewed with patient? Yes  SHORT TERM GOALS: Target date: 01/19/2024  Pt will be independent with HEP.  Baseline: Goal status: INITIAL  2.  Pt will be independent with diaphragmatic breathing and down training activities in order to improve pelvic floor relaxation. Baseline:  Goal status: INITIAL  3.  Pt will be able to correctly perform diaphragmatic breathing and appropriate pressure management in order to prevent worsening vaginal wall laxity and improve pelvic floor A/ROM.  Baseline:  Goal status: INITIAL  LONG TERM GOALS: Target date: 06/20/2024  Pt will be independent with advanced HEP.  Baseline:  Goal status: INITIAL  2.  Pt will demonstrate normal pelvic floor muscle tone and A/ROM, able to achieve 4/5 strength with contractions and 10 sec endurance, in order to provide appropriate lumbopelvic support in functional activities.  Baseline:  Goal status: INITIAL  3.  Pt to demonstrate improved coordination of pelvic floor and breathing mechanics with  10# squat with appropriate synergistic patterns to decrease pain at least 75% of the time.   Baseline:  Goal status: INITIAL  4.  Pt will report PFIQ-7 score of less than 24 points to suggest decreased functional limitations secondary to pelvic pain to improve quality of life.  Baseline:  Goal status: INITIAL  PLAN:  PT FREQUENCY: 1x/week  PT DURATION: 8 weeks  PLANNED INTERVENTIONS: 97110-Therapeutic exercises, 97530- Therapeutic activity, 97112- Neuromuscular re-education, 97535- Self Care, 16109- Manual therapy, Taping, Dry Needling, Joint mobilization, Spinal mobilization, Scar mobilization, Cryotherapy, and Moist heat  PLAN FOR NEXT SESSION: continued internal treatment to decrease left levator spasms, continued downtraining techniques  Omar Person, PT 12/29/2023, 5:15 PM

## 2023-12-30 ENCOUNTER — Ambulatory Visit: Payer: Medicaid Other | Admitting: Obstetrics and Gynecology

## 2023-12-30 ENCOUNTER — Encounter: Payer: Self-pay | Admitting: Obstetrics and Gynecology

## 2023-12-30 ENCOUNTER — Encounter: Payer: Medicaid Other | Admitting: Physical Therapy

## 2023-12-30 VITALS — BP 90/65 | HR 93

## 2023-12-30 DIAGNOSIS — M62838 Other muscle spasm: Secondary | ICD-10-CM | POA: Diagnosis not present

## 2023-12-30 MED ORDER — TRIAMCINOLONE ACETONIDE 40 MG/ML IJ SUSP
40.0000 mg | Freq: Once | INTRAMUSCULAR | Status: AC
  Administered 2023-12-30: 40 mg via INTRAMUSCULAR

## 2023-12-30 MED ORDER — BUPIVACAINE HCL 0.25 % IJ SOLN
9.0000 mL | Freq: Once | INTRAMUSCULAR | Status: AC
  Administered 2023-12-30: 9 mL

## 2023-12-30 NOTE — Progress Notes (Signed)
Ms. Olivia Shepard is a 33 y.o. female who presents for levator trigger point injection, series #4  Vitals:   12/30/23 0920  BP: 90/65  Pulse: 93    Treatment has been helping her pain. She did PT yesterday and it went well.   Indication(s): Levator spasm.   Informed Consent:  The alternatives, risks and benefits of the procedure were explained to the patient. Risks including, but not limited to discomfort, pain, bleeding, infection, injury to nearby structures, inability to perform the procedure, failure of the procedure were discussed.  All questions were answered and the patient elected to proceed.  Procedure:   The patient was positioned in dorsal lithotomy position.  The vaginal tissues were prepped with Hibiclens solution.  An injection of 9cc 0.25% Bupivacaine and 1cc 40mg  kenalog was performed in multiple in the bilateral levator muscles, a total of 5 injection sites (2 on right, 3 on left).  Pressure was held over bleeding areas until good hemostasis was achieved.   The patient tolerated the procedure well with no apparent complications. Return 1 week.  [name], MD

## 2024-01-06 ENCOUNTER — Encounter: Payer: Medicaid Other | Admitting: Physical Therapy

## 2024-01-07 ENCOUNTER — Encounter: Payer: Self-pay | Admitting: Obstetrics and Gynecology

## 2024-01-07 ENCOUNTER — Ambulatory Visit: Payer: Medicaid Other | Admitting: Obstetrics and Gynecology

## 2024-01-07 VITALS — BP 108/60

## 2024-01-07 DIAGNOSIS — M62838 Other muscle spasm: Secondary | ICD-10-CM | POA: Diagnosis not present

## 2024-01-07 MED ORDER — BUPIVACAINE HCL 0.25 % IJ SOLN
9.0000 mL | Freq: Once | INTRAMUSCULAR | Status: AC
  Administered 2024-01-07: 9 mL

## 2024-01-07 MED ORDER — TRIAMCINOLONE ACETONIDE 40 MG/ML IJ SUSP
40.0000 mg | Freq: Once | INTRAMUSCULAR | Status: AC
  Administered 2024-01-14: 40 mg via INTRAMUSCULAR

## 2024-01-07 NOTE — Progress Notes (Signed)
 Ms. Olivia Shepard is a 33 y.o. female who presents for levator trigger point injection, series #5  Vitals:   01/07/24 0919  BP: 108/60    Did not have pain for a whole week until she started her period. Then the pain down her left leg started again.   Indication(s): Levator spasm.   Informed Consent:  The alternatives, risks and benefits of the procedure were explained to the patient. Risks including, but not limited to discomfort, pain, bleeding, infection, injury to nearby structures, inability to perform the procedure, failure of the procedure were discussed.  All questions were answered and the patient elected to proceed.  Procedure:   The patient was positioned in dorsal lithotomy position.  The vaginal tissues were prepped with Hibiclens solution.  An injection of 9cc 0.25% Bupivacaine and 1cc 40mg  kenalog was performed in multiple in the bilateral levator muscles, a total of 5 injection sites (2 on right, 3 on left).  Pressure was held over bleeding areas until good hemostasis was achieved.   The patient tolerated the procedure well with no apparent complications. Return 1 week.  [name], MD

## 2024-01-12 ENCOUNTER — Ambulatory Visit: Payer: Medicaid Other | Admitting: Obstetrics and Gynecology

## 2024-01-13 ENCOUNTER — Ambulatory Visit: Payer: Medicaid Other | Attending: Obstetrics and Gynecology | Admitting: Physical Therapy

## 2024-01-13 DIAGNOSIS — R293 Abnormal posture: Secondary | ICD-10-CM | POA: Insufficient documentation

## 2024-01-13 DIAGNOSIS — R279 Unspecified lack of coordination: Secondary | ICD-10-CM | POA: Diagnosis not present

## 2024-01-13 DIAGNOSIS — R102 Pelvic and perineal pain: Secondary | ICD-10-CM | POA: Insufficient documentation

## 2024-01-13 DIAGNOSIS — M6281 Muscle weakness (generalized): Secondary | ICD-10-CM | POA: Insufficient documentation

## 2024-01-13 NOTE — Therapy (Signed)
 OUTPATIENT PHYSICAL THERAPY FEMALE PELVIC TREATMENT   Patient Name: Olivia Shepard MRN: [id number] DOB:1991-01-17, 33 y.o., female Today's Date: 01/13/2024  END OF SESSION:  PT End of Session - 01/13/24 1716     Visit Number 3    Number of Visits 8    Date for PT Re-Evaluation 01/19/24    Authorization Type Medicaid    Authorization Time Period [employer] Approved 4 visits-12/22/2023-01/20/2024    Authorization - Number of Visits 4    PT Start Time 0420    PT Stop Time 0505    PT Time Calculation (min) 45 min    Activity Tolerance Patient tolerated treatment well    Behavior During Therapy WFL for tasks assessed/performed               Past Medical History:  Diagnosis Date   Anemia    Anxiety    GERD (gastroesophageal reflux disease)    History of postpartum hemorrhage, currently pregnant    Hypothyroidism    Ruptured disk    Past Surgical History:  Procedure Laterality Date   ESOPHAGEAL DILATION     Patient Active Problem List   Diagnosis Date Noted   Protrusion of lumbar intervertebral disc 11/25/2023   Dysphagia 11/14/2020   Pelvic floor dysfunction 10/02/2020   Varicose veins of legs, antepartum 12/19/2019   GERD (gastroesophageal reflux disease) 07/25/2016   Hypothyroidism 07/19/2015   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   IBS (irritable bowel syndrome) 06/29/2012    PCP: Westley Chandler, MD  REFERRING PROVIDER: [name], MD  REFERRING DIAG: [phone] (ICD-10-CM) - Levator spasm  THERAPY DIAG:  Muscle weakness (generalized)  Unspecified lack of coordination  Abnormal posture  Pelvic pain  Rationale for Evaluation and Treatment: Rehabilitation  ONSET DATE: 3 weeks ago, but has happened previously  SUBJECTIVE:                                                                                                                                                                                           SUBJECTIVE STATEMENT: Patient  is doing well today, she arrived late to therapy. She got an injection recently for her levator spasms, which has helped with pain. 4/10 rating today. Patient fully consents to today's internal treatment and wishes to have dry needling performed.  PAIN:  01/13/24: 4-5/10   12/29/23: yes in pain, 4/10  Are you having pain? Yes NPRS scale: 4/10 Pain location: External, Deep, Bilateral, and Posterior  Pain type: aching Pain description: intermittent and sharp   Aggravating factors: as the day passes, pain gets worse  Relieving factors: nothing   PRECAUTIONS:  None  RED FLAGS: None   WEIGHT BEARING RESTRICTIONS: No  FALLS:  Has patient fallen in last 6 months? 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   ACTIVITY LEVEL : walking and carrying children   PLOF: Independent  PATIENT GOALS: decrease pain   PERTINENT HISTORY:  5 vaginal deliveries, 3 adopted children Sexual abuse: No  BOWEL MOVEMENT: Pain with bowel movement: No Type of bowel movement:Type (Bristol Stool Scale) 4, Frequency 1x/day, Strain No, and Splinting no Fully empty rectum: Yes:   Leakage: No Pads: No Fiber supplement/laative Yes: miralax if not regular  URINATION: Pain with urination: Yes - some burning with urination that mimics pelvic muscle pain  Fully empty bladder: yes  Stream: Strong Frequency: wnl Leakage: no leakage unless urgency is super high and she cannot hold it   INTERCOURSE:  Ability to have vaginal penetration Yes.   Pain with intercourse: Initial Penetration, During Penetration, and Pain Interrupts Intercourse DrynessNo Climax: no issues  Marinoff Scale: 3/3  PREGNANCY: Vaginal deliveries 5 vaginal deliveries  Tearing Yes: with 1st 3 deliveries Currently pregnant No  PROLAPSE: Pressure to the left side of pelvic floor in the past   OBJECTIVE:  Note: Objective measures were completed at Evaluation unless otherwise noted.  PATIENT SURVEYS:   PFIQ-7: 48  COGNITION: Overall cognitive status: Within functional limits for tasks assessed     SENSATION: Light touch: Appears intact  LUMBAR SPECIAL TESTS:  Single leg stance test: Positive  FUNCTIONAL TESTS:  Squat: bilateral dynamic knee valgus with loading   GAIT: Comments: mild trendelenburg gait pattern with ambulation   POSTURE: rounded shoulders and forward head   LUMBARAROM/PROM: WNL  A/PROM A/PROM  eval  Flexion   Extension   Right lateral flexion   Left lateral flexion   Right rotation   Left rotation    (Blank rows = not tested)  LOWER EXTREMITY ROM:  Active ROM Right eval Left eval  Hip flexion    Hip extension    Hip abduction    Hip adduction    Hip internal rotation    Hip external rotation    Knee flexion    Knee extension    Ankle dorsiflexion    Ankle plantarflexion    Ankle inversion    Ankle eversion     (Blank rows = not tested)  LOWER EXTREMITY MMT: 4-/5 grossly bilateral hips, bilateral knees 4/5   MMT Right eval Left eval  Hip flexion    Hip extension    Hip abduction    Hip adduction    Hip internal rotation    Hip external rotation    Knee flexion    Knee extension    Ankle dorsiflexion    Ankle plantarflexion    Ankle inversion    Ankle eversion     (Blank rows = not tested) PALPATION: General: no tenderness to palpation of adductors/hip flexors, some discomfort with pubic bone palpation  Pelvic Alignment: within normal limits   Abdominal: decreased rib mobility with inhalation                 External Perineal Exam: increased tenderness with palpation of left sided pelvic floor musculature                              Internal Pelvic Floor: left>right levator muscle tension, no trigger points noted, general overactivity found throughout entirety  of pelvic floor musculature   Patient confirms identification and approves PT to assess internal pelvic floor and treatment Yes No emotional/communication  barriers or cognitive limitation. Patient is motivated to learn. Patient understands and agrees with treatment goals and plan. PT explains patient will be examined in standing, sitting, and lying down to see how their muscles and joints work. When they are ready, they will be asked to remove their underwear so PT can examine their perineum. The patient is also given the option of providing their own chaperone as one is not provided in our facility. The patient also has the right and is explained the right to defer or refuse any part of the evaluation or treatment including the internal exam. With the patient's consent, PT will use one gloved finger to gently assess the muscles of the pelvic floor, seeing how well it contracts and relaxes and if there is muscle symmetry. After, the patient will get dressed and PT and patient will discuss exam findings and plan of care. PT and patient discuss plan of care, schedule, attendance policy and HEP activities.  PELVIC MMT:   MMT eval  Vaginal 3/5, generally increased muscle tone throughout  Internal Anal Sphincter   External Anal Sphincter   Puborectalis   Diastasis Recti   (Blank rows = not tested)        TONE: Increased muscle tension noted throughout pelvic floor musculature layers 1/2/3 bilateral   PROLAPSE: Anterior vaginal wall laxity with cough test   TODAY'S TREATMENT:                                                                                                                              DATE:   EVAL 12/22/23: Examination completed, findings reviewed, pt educated on POC, HEP, and therapeutic activities/neuro re-ed. Pt motivated to participate in PT and agreeable to attempt recommendations.  Neuro re-ed: Hooklying diaphragmatic breathing with pelvic floor musculature lengthening during inhalation2x10 breaths  Childs pose + diaphragmatic breathing  Supine butterfly stretch + diaphragmatic breathing  Therapeutic activities: Education  details: pelvic floor muscle active range of motion, diaphragmatic breathing and the connection to the pelvic floor, vaginal moisturizers Manual therapy  IASTM bilateral quadratus lumborum/piriformis/lumbar musculature with attaday  Treatment 12/29/23 Neuro re-ed: Hooklying diaphragmatic breathing with pelvic floor musculature lengthening during inhalation2x10 breaths  Childs pose + diaphragmatic breathing  Supine butterfly stretch + diaphragmatic breathing  Manual therapy  IASTM bilateral quadratus lumborum/piriformis/lumbar musculature with attaday Internal left sided levator ani muscle release and manual stretching with inhalation   01/13/24: Manual therapy  Internal left sided levator ani muscle release and manual stretching with inhalation  Trigger Point Dry Needling  Initial Treatment: Pt instructed on Dry Needling rational, procedures, and possible side effects. Pt instructed to expect mild to moderate muscle soreness later in the day and/or into the next day.  Pt instructed in methods to reduce muscle soreness. Pt instructed to continue prescribed HEP. Because Dry Needling was  performed over or adjacent to a lung field, pt was educated on S/S of pneumothorax and to seek immediate medical attention should they occur.  Patient was educated on signs and symptoms of infection and other risk factors and advised to seek medical attention should they occur.  Patient verbalized understanding of these instructions and education.   Patient Verbal Consent Given: Yes Education Handout Provided: Yes Muscles Treated: bilateral gluteus maximus, gluteus medius, piriformis  Electrical Stimulation Performed: No Treatment Response/Outcome: decreased trigger points in each muscle location. No pain following treatment, patient reports decreased muscle tension overall.  Neuro re-ed: Hooklying diaphragmatic breathing with pelvic floor lengthening and shortening with inhalation/exhalation 3x10  Foam  roll sit for pelvic floor lengthening and relaxation 2x13min  Happy baby + diaphragmatic breathing for pelvic floor relaxation 2x31min   PATIENT EDUCATION:  Education details: pelvic floor muscle active range of motion, diaphragmatic breathing and the connection to the pelvic floor, vaginal moisturizers  Person educated: Patient Education method: Explanation, Demonstration, Tactile cues, Verbal cues, and Handouts Education comprehension: verbalized understanding, returned demonstration, verbal cues required, and tactile cues required  HOME EXERCISE PROGRAM: Access Code: [email or web address] URL: [email or web address] Date: 01/13/2024 Prepared by: Earna Coder  Exercises - Supine Pelvic Floor Stretch  - 1 x daily - 7 x weekly - 3 sets - hold - Supine Pelvic Floor Contraction  - 1 x daily - 7 x weekly - 3 sets - 10 reps -foam roll sit for pelvic floor relaxation   ASSESSMENT:  CLINICAL IMPRESSION: Patient is a 33 y.o. female  who was seen today for physical therapy treatment for pelvic pain that travels down bilateral posterior legs and is more prominent on the left side. Her pelvic pain is 4-5/10 today and she feels tension in bilateral glutes. Pt fully consents to internal treatment and she demonstrated increased muscle tension in left sided levator group. Following internal manual release of bilateral levator groups, patient felt less tense in the pelvic region and no N/T in the legs. Dry needling performed to bilateral glutes and patient reports significant improvement in tension following needling. Patient plans to continue breathing and stretching at home. Pt would benefit from additional PT to further address deficits.   OBJECTIVE IMPAIRMENTS: decreased coordination, decreased endurance, decreased mobility, decreased ROM, decreased strength, and pain.   ACTIVITY LIMITATIONS: lifting  PARTICIPATION LIMITATIONS:  vaginal penetration   PERSONAL FACTORS: Past/current experiences  and Time since onset of injury/illness/exacerbation are also affecting patient's functional outcome.   REHAB POTENTIAL: Good  CLINICAL DECISION MAKING: Stable/uncomplicated  EVALUATION COMPLEXITY: Low   GOALS: Goals reviewed with patient? Yes  SHORT TERM GOALS: Target date: 01/19/2024  Pt will be independent with HEP.  Baseline: Goal status: INITIAL  2.  Pt will be independent with diaphragmatic breathing and down training activities in order to improve pelvic floor relaxation. Baseline:  Goal status: INITIAL  3.  Pt will be able to correctly perform diaphragmatic breathing and appropriate pressure management in order to prevent worsening vaginal wall laxity and improve pelvic floor A/ROM.  Baseline:  Goal status: INITIAL  LONG TERM GOALS: Target date: 06/20/2024  Pt will be independent with advanced HEP.  Baseline:  Goal status: INITIAL  2.  Pt will demonstrate normal pelvic floor muscle tone and A/ROM, able to achieve 4/5 strength with contractions and 10 sec endurance, in order to provide appropriate lumbopelvic support in functional activities.  Baseline:  Goal status: INITIAL  3.  Pt to demonstrate improved coordination of pelvic floor and breathing mechanics  with 10# squat with appropriate synergistic patterns to decrease pain at least 75% of the time.   Baseline:  Goal status: INITIAL  4.  Pt will report PFIQ-7 score of less than 24 points to suggest decreased functional limitations secondary to pelvic pain to improve quality of life.  Baseline:  Goal status: INITIAL  PLAN:  PT FREQUENCY: 1x/week  PT DURATION: 8 weeks  PLANNED INTERVENTIONS: 97110-Therapeutic exercises, 97530- Therapeutic activity, 97112- Neuromuscular re-education, 97535- Self Care, 16109- Manual therapy, Taping, Dry Needling, Joint mobilization, Spinal mobilization, Scar mobilization, Cryotherapy, and Moist heat  PLAN FOR NEXT SESSION: continued internal treatment to decrease left levator  spasms, continued downtraining techniques  Omar Person, PT 01/13/2024, 5:18 PM

## 2024-01-14 ENCOUNTER — Encounter: Payer: Self-pay | Admitting: Obstetrics and Gynecology

## 2024-01-14 ENCOUNTER — Ambulatory Visit: Admitting: Obstetrics and Gynecology

## 2024-01-14 VITALS — BP 108/71 | HR 75

## 2024-01-14 DIAGNOSIS — M62838 Other muscle spasm: Secondary | ICD-10-CM | POA: Diagnosis not present

## 2024-01-14 MED ORDER — BUPIVACAINE HCL 0.25 % IJ SOLN
10.0000 mL | Freq: Once | INTRAMUSCULAR | Status: AC
  Administered 2024-01-14: 10 mL

## 2024-01-14 NOTE — Progress Notes (Signed)
 Ms. Olivia Shepard is a 33 y.o. female who presents for levator trigger point injection, series #6  Vitals:   01/14/24 0910  BP: 108/71  Pulse: 75    Indication(s): Levator spasm.   Informed Consent:  The alternatives, risks and benefits of the procedure were explained to the patient. Risks including, but not limited to discomfort, pain, bleeding, infection, injury to nearby structures, inability to perform the procedure, failure of the procedure were discussed.  All questions were answered and the patient elected to proceed.  Procedure:   The patient was positioned in dorsal lithotomy position.  The vaginal tissues were prepped with Hibiclens solution.  An injection of 10cc 0.25% Bupivacaine was performed in multiple in the bilateral levator muscles, a total of 5 injection sites (2 on right, 3 on left).  Pressure was held over bleeding areas until good hemostasis was achieved.   The patient tolerated the procedure well with no apparent complications.   Follow up 1 month  [name], MD

## 2024-01-20 ENCOUNTER — Ambulatory Visit: Payer: Medicaid Other | Admitting: Physical Therapy

## 2024-01-20 DIAGNOSIS — M6281 Muscle weakness (generalized): Secondary | ICD-10-CM

## 2024-01-20 DIAGNOSIS — R102 Pelvic and perineal pain unspecified side: Secondary | ICD-10-CM

## 2024-01-20 DIAGNOSIS — R279 Unspecified lack of coordination: Secondary | ICD-10-CM

## 2024-01-20 DIAGNOSIS — R293 Abnormal posture: Secondary | ICD-10-CM

## 2024-01-20 NOTE — Therapy (Signed)
 OUTPATIENT PHYSICAL THERAPY FEMALE PELVIC TREATMENT   Patient Name: Olivia Shepard MRN: [id number] DOB:[birth year], 33 y.o., female Today's Date: 01/20/2024  END OF SESSION:  PT End of Session - 01/20/24 0923     Visit Number 4    Number of Visits 8    Date for PT Re-Evaluation 01/19/24    Authorization Type Medicaid    Authorization Time Period [employer] Approved 4 visits-12/22/2023-01/20/2024    Authorization - Visit Number 4    Authorization - Number of Visits 4    PT Start Time [phone]    PT Stop Time 0930    PT Time Calculation (min) 48 min    Activity Tolerance Patient tolerated treatment well    Behavior During Therapy WFL for tasks assessed/performed                Past Medical History:  Diagnosis Date   Anemia    Anxiety    GERD (gastroesophageal reflux disease)    History of postpartum hemorrhage, currently pregnant    Hypothyroidism    Ruptured disk    Past Surgical History:  Procedure Laterality Date   ESOPHAGEAL DILATION     Patient Active Problem List   Diagnosis Date Noted   Protrusion of lumbar intervertebral disc 11/25/2023   Dysphagia 11/14/2020   Pelvic floor dysfunction 10/02/2020   Varicose veins of legs, antepartum 12/19/2019   GERD (gastroesophageal reflux disease) 07/25/2016   Hypothyroidism 07/19/2015   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   IBS (irritable bowel syndrome) 06/29/2012    PCP: Westley Chandler, MD  REFERRING PROVIDER: [name], MD  REFERRING DIAG: [phone] (ICD-10-CM) - Levator spasm  THERAPY DIAG:  Muscle weakness (generalized)  Unspecified lack of coordination  Abnormal posture  Pelvic pain  Rationale for Evaluation and Treatment: Rehabilitation  ONSET DATE: 3 weeks ago, but has happened previously  SUBJECTIVE:                                                                                                                                                                                            SUBJECTIVE STATEMENT: Most recent levator injection 01/14/24 - series #6. This week has felt good. She is now done with her injection series and has a follow up appointment with Dr. Florian Buff in 1 month to assess pain levels. 3/10 pain at rest today, left hip is feeling tight in the back side today. Patient felt good after needling last session and had no soreness - she wishes to have more dry needling in the hips today for soreness and internal treatment to decrease pelvic muscle tension.  PAIN:  01/20/24: 3/10  01/13/24: 4-5/10   12/29/23: yes in pain, 4/10  Are you having pain? Yes NPRS scale: 4/10 Pain location: External, Deep, Bilateral, and Posterior  Pain type: aching Pain description: intermittent and sharp   Aggravating factors: as the day passes, pain gets worse  Relieving factors: nothing   PRECAUTIONS: None  RED FLAGS: None   WEIGHT BEARING RESTRICTIONS: No  FALLS:  Has patient fallen in last 6 months? 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   ACTIVITY LEVEL : walking and carrying children   PLOF: Independent  PATIENT GOALS: decrease pain   PERTINENT HISTORY:  5 vaginal deliveries, 3 adopted children Sexual abuse: No  BOWEL MOVEMENT: Pain with bowel movement: No Type of bowel movement:Type (Bristol Stool Scale) 4, Frequency 1x/day, Strain No, and Splinting no Fully empty rectum: Yes:   Leakage: No Pads: No Fiber supplement/laative Yes: miralax if not regular  URINATION: Pain with urination: Yes - some burning with urination that mimics pelvic muscle pain  Fully empty bladder: yes  Stream: Strong Frequency: wnl Leakage: no leakage unless urgency is super high and she cannot hold it   INTERCOURSE:  Ability to have vaginal penetration Yes.   Pain with intercourse: Initial Penetration, During Penetration, and Pain Interrupts Intercourse DrynessNo Climax: no issues  Marinoff Scale: 3/3  PREGNANCY: Vaginal  deliveries 5 vaginal deliveries  Tearing Yes: with 1st 3 deliveries Currently pregnant No  PROLAPSE: Pressure to the left side of pelvic floor in the past   OBJECTIVE:  Note: Objective measures were completed at Evaluation unless otherwise noted.  PATIENT SURVEYS:  PFIQ-7: 24  COGNITION: Overall cognitive status: Within functional limits for tasks assessed     SENSATION: Light touch: Appears intact  LUMBAR SPECIAL TESTS:  Single leg stance test: Positive  FUNCTIONAL TESTS:  Squat: bilateral dynamic knee valgus with loading   GAIT: Comments: mild trendelenburg gait pattern with ambulation   POSTURE: rounded shoulders and forward head   LUMBARAROM/PROM: WNL  A/PROM A/PROM  eval  Flexion   Extension   Right lateral flexion   Left lateral flexion   Right rotation   Left rotation    (Blank rows = not tested)  LOWER EXTREMITY ROM:  Active ROM Right eval Left eval  Hip flexion    Hip extension    Hip abduction    Hip adduction    Hip internal rotation    Hip external rotation    Knee flexion    Knee extension    Ankle dorsiflexion    Ankle plantarflexion    Ankle inversion    Ankle eversion     (Blank rows = not tested)  LOWER EXTREMITY MMT: 4-/5 grossly bilateral hips, bilateral knees 4/5   MMT Right eval Left eval  Hip flexion    Hip extension    Hip abduction    Hip adduction    Hip internal rotation    Hip external rotation    Knee flexion    Knee extension    Ankle dorsiflexion    Ankle plantarflexion    Ankle inversion    Ankle eversion     (Blank rows = not tested) PALPATION: General: no tenderness to palpation of adductors/hip flexors, some discomfort with pubic bone palpation  Pelvic Alignment: within normal limits   Abdominal: decreased rib mobility with inhalation                 External Perineal Exam:  increased tenderness with palpation of left sided pelvic floor musculature                              Internal Pelvic  Floor: left>right levator muscle tension, no trigger points noted, general overactivity found throughout entirety of pelvic floor musculature   Patient confirms identification and approves PT to assess internal pelvic floor and treatment Yes No emotional/communication barriers or cognitive limitation. Patient is motivated to learn. Patient understands and agrees with treatment goals and plan. PT explains patient will be examined in standing, sitting, and lying down to see how their muscles and joints work. When they are ready, they will be asked to remove their underwear so PT can examine their perineum. The patient is also given the option of providing their own chaperone as one is not provided in our facility. The patient also has the right and is explained the right to defer or refuse any part of the evaluation or treatment including the internal exam. With the patient's consent, PT will use one gloved finger to gently assess the muscles of the pelvic floor, seeing how well it contracts and relaxes and if there is muscle symmetry. After, the patient will get dressed and PT and patient will discuss exam findings and plan of care. PT and patient discuss plan of care, schedule, attendance policy and HEP activities.  PELVIC MMT:   MMT eval  Vaginal 3/5, generally increased muscle tone throughout  Internal Anal Sphincter   External Anal Sphincter   Puborectalis   Diastasis Recti   (Blank rows = not tested)        TONE: Increased muscle tension noted throughout pelvic floor musculature layers 1/2/3 bilateral   PROLAPSE: Anterior vaginal wall laxity with cough test   TODAY'S TREATMENT:                                                                                                                              DATE:   Treatment 12/29/23 Neuro re-ed: Hooklying diaphragmatic breathing with pelvic floor musculature lengthening during inhalation2x10 breaths  Childs pose + diaphragmatic breathing   Supine butterfly stretch + diaphragmatic breathing  Manual therapy  IASTM bilateral quadratus lumborum/piriformis/lumbar musculature with attaday Internal left sided levator ani muscle release and manual stretching with inhalation   01/13/24: Manual therapy  Internal left sided levator ani muscle release and manual stretching with inhalation  Trigger Point Dry Needling  Initial Treatment: Pt instructed on Dry Needling rational, procedures, and possible side effects. Pt instructed to expect mild to moderate muscle soreness later in the day and/or into the next day.  Pt instructed in methods to reduce muscle soreness. Pt instructed to continue prescribed HEP. Because Dry Needling was performed over or adjacent to a lung field, pt was educated on S/S of pneumothorax and to seek immediate medical attention should they occur.  Patient was educated on signs and symptoms  of infection and other risk factors and advised to seek medical attention should they occur.  Patient verbalized understanding of these instructions and education.   Patient Verbal Consent Given: Yes Education Handout Provided: Yes Muscles Treated: bilateral gluteus maximus, gluteus medius, piriformis  Electrical Stimulation Performed: No Treatment Response/Outcome: decreased trigger points in each muscle location. No pain following treatment, patient reports decreased muscle tension overall.  Neuro re-ed: Hooklying diaphragmatic breathing with pelvic floor lengthening and shortening with inhalation/exhalation 3x10  Foam roll sit for pelvic floor lengthening and relaxation 2x94min  Happy baby + diaphragmatic breathing for pelvic floor relaxation 2x68min  01/20/24: Manual therapy  Internal left sided levator ani muscle release and manual stretching with inhalation  Trigger Point Dry Needling Subsequent Treatment: Instructions provided previously at initial dry needling treatment.  Patient Verbal Consent Given: Yes Education  Handout Provided: Previously Provided Muscles Treated: gluteus max, gluteus med, piriformis bilaterally  Electrical Stimulation Performed: No Treatment Response/Outcome: twitch response present in Left sided glute med; no pain following treatment. Neuro re-ed: Hooklying diaphragmatic breathing with pelvic floor lengthening and shortening with inhalation/exhalation 3x10  Foam roll sit for pelvic floor lengthening and relaxation 2x10min  Happy baby + diaphragmatic breathing for pelvic floor relaxation 2x39min   PATIENT EDUCATION:  Education details: pelvic floor muscle active range of motion, diaphragmatic breathing and the connection to the pelvic floor, vaginal moisturizers  Person educated: Patient Education method: Explanation, Demonstration, Tactile cues, Verbal cues, and Handouts Education comprehension: verbalized understanding, returned demonstration, verbal cues required, and tactile cues required  HOME EXERCISE PROGRAM: Access Code: [email or web address] URL: [email or web address] Date: 01/13/2024 Prepared by: Earna Coder  Exercises - Supine Pelvic Floor Stretch  - 1 x daily - 7 x weekly - 3 sets - hold - Supine Pelvic Floor Contraction  - 1 x daily - 7 x weekly - 3 sets - 10 reps -foam roll sit for pelvic floor relaxation   ASSESSMENT:  CLINICAL IMPRESSION: Patient is a 33 y.o. female  who was seen today for physical therapy treatment for pelvic pain that travels down bilateral posterior legs and is more prominent on the left side. She has attended 4 sessions of pelvic PT thus far and is feeling better each week, in conjunction with levator injections. Her pelvic pain is 3/10 today and she feels tension in bilateral glutes. Pt fully consents to internal treatment and she demonstrated increased muscle tension in left sided levator group. Following internal manual release of bilateral levator groups, patient felt less tense in the pelvic region and no N/T in the legs. Dry  needling performed to bilateral glutes and patient reports significant improvement in tension following needling. No pain at end of session, but she can tell there is soreness in the glutes from dry needling. Patient plans to continue breathing and stretching at home. Pt would benefit from additional PT to further address remaining pelvic pain and lack of coordination in the pelvic floor.  OBJECTIVE IMPAIRMENTS: decreased coordination, decreased endurance, decreased mobility, decreased ROM, decreased strength, and pain.   ACTIVITY LIMITATIONS: lifting  PARTICIPATION LIMITATIONS:  vaginal penetration   PERSONAL FACTORS: Past/current experiences and Time since onset of injury/illness/exacerbation are also affecting patient's functional outcome.   REHAB POTENTIAL: Good  CLINICAL DECISION MAKING: Stable/uncomplicated  EVALUATION COMPLEXITY: Low   GOALS: Goals reviewed with patient? Yes  SHORT TERM GOALS: Target date: 01/19/2024  Pt will be independent with HEP.  Baseline: Goal status: GOAL MET 01/20/24  2.  Pt will be independent with diaphragmatic breathing and  down training activities in order to improve pelvic floor relaxation. Baseline:  Goal status: GOAL MET 01/20/24  3.  Pt will be able to correctly perform diaphragmatic breathing and appropriate pressure management in order to prevent worsening vaginal wall laxity and improve pelvic floor A/ROM.  Baseline:  Goal status: GOAL MET 01/20/24  LONG TERM GOALS: Target date: 06/20/2024  Pt will be independent with advanced HEP.  Baseline:  Goal status: GOAL MET 01/20/24  2.  Pt will demonstrate normal pelvic floor muscle tone and A/ROM, able to achieve 4/5 strength with contractions and 10 sec endurance, in order to provide appropriate lumbopelvic support in functional activities.  Baseline:  Goal status: ONGOING 01/19/14  3.  Pt to demonstrate improved coordination of pelvic floor and breathing mechanics with 10# squat with  appropriate synergistic patterns to decrease pain at least 75% of the time.   Baseline:  Goal status: ONGOING 01/19/14  4.  Pt will report PFIQ-7 score of less than 24 points to suggest decreased functional limitations secondary to pelvic pain to improve quality of life.  Baseline:  Goal status: GOAL MET 01/20/24  PLAN:  PT FREQUENCY: 1x/week  PT DURATION: 8 weeks  PLANNED INTERVENTIONS: 97110-Therapeutic exercises, 97530- Therapeutic activity, 97112- Neuromuscular re-education, 97535- Self Care, 13086- Manual therapy, Taping, Dry Needling, Joint mobilization, Spinal mobilization, Scar mobilization, Cryotherapy, and Moist heat  PLAN FOR NEXT SESSION: continued internal treatment to decrease left levator spasms, continued downtraining techniques  Omar Person, PT 01/20/2024, 9:24 AM

## 2024-01-27 ENCOUNTER — Encounter: Payer: Medicaid Other | Admitting: Obstetrics and Gynecology

## 2024-01-28 ENCOUNTER — Ambulatory Visit: Payer: Medicaid Other | Admitting: Physical Therapy

## 2024-01-28 DIAGNOSIS — R293 Abnormal posture: Secondary | ICD-10-CM

## 2024-01-28 DIAGNOSIS — R102 Pelvic and perineal pain: Secondary | ICD-10-CM | POA: Diagnosis not present

## 2024-01-28 DIAGNOSIS — R279 Unspecified lack of coordination: Secondary | ICD-10-CM

## 2024-01-28 DIAGNOSIS — M6281 Muscle weakness (generalized): Secondary | ICD-10-CM | POA: Diagnosis not present

## 2024-01-28 NOTE — Therapy (Signed)
 OUTPATIENT PHYSICAL THERAPY FEMALE PELVIC TREATMENT   Patient Name: Olivia Shepard MRN: [id number] DOB:05/11/1991, 33 y.o., female Today's Date: 01/28/2024  END OF SESSION:  PT End of Session - 01/28/24 0907     Visit Number 5    Number of Visits 8    Date for PT Re-Evaluation 01/19/24    Authorization Type Medicaid    Authorization Time Period [employer] Approved 4 visits-12/22/2023-01/20/2024    Authorization - Number of Visits 4    PT Start Time 0900    PT Stop Time 0930    PT Time Calculation (min) 30 min    Activity Tolerance Patient tolerated treatment well    Behavior During Therapy WFL for tasks assessed/performed                 Past Medical History:  Diagnosis Date   Anemia    Anxiety    GERD (gastroesophageal reflux disease)    History of postpartum hemorrhage, currently pregnant    Hypothyroidism    Ruptured disk    Past Surgical History:  Procedure Laterality Date   ESOPHAGEAL DILATION     Patient Active Problem List   Diagnosis Date Noted   Protrusion of lumbar intervertebral disc 11/25/2023   Dysphagia 11/14/2020   Pelvic floor dysfunction 10/02/2020   Varicose veins of legs, antepartum 12/19/2019   GERD (gastroesophageal reflux disease) 07/25/2016   Hypothyroidism 07/19/2015   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   IBS (irritable bowel syndrome) 06/29/2012    PCP: Westley Chandler, MD  REFERRING PROVIDER: [name], MD  REFERRING DIAG: [id number] (ICD-10-CM) - Levator spasm  THERAPY DIAG:  Muscle weakness (generalized)  Unspecified lack of coordination  Abnormal posture  Pelvic pain  Rationale for Evaluation and Treatment: Rehabilitation  ONSET DATE: 3 weeks ago, but has happened previously  SUBJECTIVE:                                                                                                                                                                                           SUBJECTIVE  STATEMENT: Patient arrived 15 minutes late to PT session today. Overall, patient has been feeling a lot better, but the past two days she has had some burning pain at the urethra. Her nerve pain in the legs has been improving, other than a shooting pain down the leg that is concentrated in the posterior aspect of the knee.   PAIN:  01/28/24: 3-4/10  01/20/24: 3/10  01/13/24: 4-5/10   12/29/23: yes in pain, 4/10  Are you having pain? Yes NPRS scale: 4/10 Pain location: External, Deep, Bilateral, and Posterior  Pain type: aching Pain description: intermittent and sharp   Aggravating factors: as the day passes, pain gets worse  Relieving factors: nothing   PRECAUTIONS: None  RED FLAGS: None   WEIGHT BEARING RESTRICTIONS: No  FALLS:  Has patient fallen in last 6 months? 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   ACTIVITY LEVEL : walking and carrying children   PLOF: Independent  PATIENT GOALS: decrease pain   PERTINENT HISTORY:  5 vaginal deliveries, 3 adopted children Sexual abuse: No  BOWEL MOVEMENT: Pain with bowel movement: No Type of bowel movement:Type (Bristol Stool Scale) 4, Frequency 1x/day, Strain No, and Splinting no Fully empty rectum: Yes:   Leakage: No Pads: No Fiber supplement/laative Yes: miralax if not regular  URINATION: Pain with urination: Yes - some burning with urination that mimics pelvic muscle pain  Fully empty bladder: yes  Stream: Strong Frequency: wnl Leakage: no leakage unless urgency is super high and she cannot hold it   INTERCOURSE:  Ability to have vaginal penetration Yes.   Pain with intercourse: Initial Penetration, During Penetration, and Pain Interrupts Intercourse DrynessNo Climax: no issues  Marinoff Scale: 3/3  PREGNANCY: Vaginal deliveries 5 vaginal deliveries  Tearing Yes: with 1st 3 deliveries Currently pregnant No  PROLAPSE: Pressure to the left side of pelvic floor in the  past   OBJECTIVE:  Note: Objective measures were completed at Evaluation unless otherwise noted.  PATIENT SURVEYS:  PFIQ-7: 24  COGNITION: Overall cognitive status: Within functional limits for tasks assessed     SENSATION: Light touch: Appears intact  LUMBAR SPECIAL TESTS:  Single leg stance test: Positive  FUNCTIONAL TESTS:  Squat: bilateral dynamic knee valgus with loading   GAIT: Comments: mild trendelenburg gait pattern with ambulation   POSTURE: rounded shoulders and forward head   LUMBARAROM/PROM: WNL  A/PROM A/PROM  eval  Flexion   Extension   Right lateral flexion   Left lateral flexion   Right rotation   Left rotation    (Blank rows = not tested)  LOWER EXTREMITY ROM:  Active ROM Right eval Left eval  Hip flexion    Hip extension    Hip abduction    Hip adduction    Hip internal rotation    Hip external rotation    Knee flexion    Knee extension    Ankle dorsiflexion    Ankle plantarflexion    Ankle inversion    Ankle eversion     (Blank rows = not tested)  LOWER EXTREMITY MMT: 4-/5 grossly bilateral hips, bilateral knees 4/5   MMT Right eval Left eval  Hip flexion    Hip extension    Hip abduction    Hip adduction    Hip internal rotation    Hip external rotation    Knee flexion    Knee extension    Ankle dorsiflexion    Ankle plantarflexion    Ankle inversion    Ankle eversion     (Blank rows = not tested) PALPATION: General: no tenderness to palpation of adductors/hip flexors, some discomfort with pubic bone palpation  Pelvic Alignment: within normal limits   Abdominal: decreased rib mobility with inhalation                 External Perineal Exam: increased tenderness with palpation of left sided pelvic floor musculature  Internal Pelvic Floor: left>right levator muscle tension, no trigger points noted, general overactivity found throughout entirety of pelvic floor musculature   Patient  confirms identification and approves PT to assess internal pelvic floor and treatment Yes No emotional/communication barriers or cognitive limitation. Patient is motivated to learn. Patient understands and agrees with treatment goals and plan. PT explains patient will be examined in standing, sitting, and lying down to see how their muscles and joints work. When they are ready, they will be asked to remove their underwear so PT can examine their perineum. The patient is also given the option of providing their own chaperone as one is not provided in our facility. The patient also has the right and is explained the right to defer or refuse any part of the evaluation or treatment including the internal exam. With the patient's consent, PT will use one gloved finger to gently assess the muscles of the pelvic floor, seeing how well it contracts and relaxes and if there is muscle symmetry. After, the patient will get dressed and PT and patient will discuss exam findings and plan of care. PT and patient discuss plan of care, schedule, attendance policy and HEP activities.  PELVIC MMT:   MMT eval  Vaginal 3/5, generally increased muscle tone throughout  Internal Anal Sphincter   External Anal Sphincter   Puborectalis   Diastasis Recti   (Blank rows = not tested)        TONE: Increased muscle tension noted throughout pelvic floor musculature layers 1/2/3 bilateral   PROLAPSE: Anterior vaginal wall laxity with cough test   TODAY'S TREATMENT:                                                                                                                              DATE:   01/20/24: Manual therapy  Internal left sided levator ani muscle release and manual stretching with inhalation  Trigger Point Dry Needling Subsequent Treatment: Instructions provided previously at initial dry needling treatment.  Patient Verbal Consent Given: Yes Education Handout Provided: Previously Provided Muscles Treated:  gluteus max, gluteus med, piriformis bilaterally  Electrical Stimulation Performed: No Treatment Response/Outcome: twitch response present in Left sided glute med; no pain following treatment. Neuro re-ed: Hooklying diaphragmatic breathing with pelvic floor lengthening and shortening with inhalation/exhalation 3x10  Foam roll sit for pelvic floor lengthening and relaxation 2x53min  Happy baby + diaphragmatic breathing for pelvic floor relaxation 2x57min  01/28/24: Manual therapy  Internal left sided levator ani muscle release and manual stretching with inhalation  Trigger Point Dry Needling Subsequent Treatment: Instructions provided previously at initial dry needling treatment.  Patient Verbal Consent Given: Yes Education Handout Provided: Previously Provided Muscles Treated: gluteus max, gluteus med, piriformis bilaterally, lumbar multifidi, iliocostalis lumborum  Electrical Stimulation Performed: No Treatment Response/Outcome: twitch response present in Left sided glute med; no pain following treatment. Lumbar grade 1/2 mobilizations of spine to decrease nerve pain down left leg  Neuro re-ed: SEATED  diaphragmatic breathing with pelvic floor lengthening and shortening with inhalation/exhalation 3x10  Seated diaphragmatic breathing + pelvic floor quick flick contractions + diaphragmatic breathing 2x10    PATIENT EDUCATION:  Education details: pelvic floor muscle active range of motion, diaphragmatic breathing and the connection to the pelvic floor, vaginal moisturizers  Person educated: Patient Education method: Explanation, Demonstration, Tactile cues, Verbal cues, and Handouts Education comprehension: verbalized understanding, returned demonstration, verbal cues required, and tactile cues required  HOME EXERCISE PROGRAM: Access Code: [email or web address] URL: [email or web address] Date: 01/28/2024 Prepared by: Earna Coder  Exercises - Seated Pelvic Floor Contraction  - 1 x daily  - 7 x weekly - 2 sets - 10 reps - Seated Quick Flick Pelvic Floor Contractions  - 1 x daily - 7 x weekly - 2 sets - 10 reps  ASSESSMENT:  CLINICAL IMPRESSION: Patient is a 33 y.o. female  who was seen today for physical therapy treatment for pelvic pain that travels down bilateral posterior legs and is more prominent on the left side. She has attended 5 sessions of pelvic PT thus far and is feeling better each week, in conjunction with levator injections. Her pelvic pain has been much better overall, but she reports 4/10 discomfort today primarily on the left side of the pelvic floor and lower back. Pt fully consents to internal treatment and she demonstrated increased muscle tension in left sided levator group and obturator internus. Following internal manual release of bilateral levator groups, patient felt less tense in the pelvic region and no N/T in the legs. Dry needling performed to bilateral glutes and lumbar spine and patient reports significant improvement in tension following needling. No pain at end of session, patient reports feeling much better than when she arrived today. Seated diaphragmatic breathing wit pelvic floor control introduced with quick flick contractions also. Pt would benefit from additional PT to further address remaining pelvic pain and lack of coordination in the pelvic floor.  OBJECTIVE IMPAIRMENTS: decreased coordination, decreased endurance, decreased mobility, decreased ROM, decreased strength, and pain.   ACTIVITY LIMITATIONS: lifting  PARTICIPATION LIMITATIONS:  vaginal penetration   PERSONAL FACTORS: Past/current experiences and Time since onset of injury/illness/exacerbation are also affecting patient's functional outcome.   REHAB POTENTIAL: Good  CLINICAL DECISION MAKING: Stable/uncomplicated  EVALUATION COMPLEXITY: Low   GOALS: Goals reviewed with patient? Yes  SHORT TERM GOALS: Target date: 01/19/2024  Pt will be independent with HEP.   Baseline: Goal status: GOAL MET 01/20/24  2.  Pt will be independent with diaphragmatic breathing and down training activities in order to improve pelvic floor relaxation. Baseline:  Goal status: GOAL MET 01/20/24  3.  Pt will be able to correctly perform diaphragmatic breathing and appropriate pressure management in order to prevent worsening vaginal wall laxity and improve pelvic floor A/ROM.  Baseline:  Goal status: GOAL MET 01/20/24  LONG TERM GOALS: Target date: 06/20/2024  Pt will be independent with advanced HEP.  Baseline:  Goal status: GOAL MET 01/20/24  2.  Pt will demonstrate normal pelvic floor muscle tone and A/ROM, able to achieve 4/5 strength with contractions and 10 sec endurance, in order to provide appropriate lumbopelvic support in functional activities.  Baseline:  Goal status: ONGOING 01/19/14  3.  Pt to demonstrate improved coordination of pelvic floor and breathing mechanics with 10# squat with appropriate synergistic patterns to decrease pain at least 75% of the time.   Baseline:  Goal status: ONGOING 01/19/14  4.  Pt will report PFIQ-7 score of less than 24 points to  suggest decreased functional limitations secondary to pelvic pain to improve quality of life.  Baseline:  Goal status: GOAL MET 01/20/24  PLAN:  PT FREQUENCY: 1x/week  PT DURATION: 8 weeks  PLANNED INTERVENTIONS: 97110-Therapeutic exercises, 97530- Therapeutic activity, 97112- Neuromuscular re-education, 97535- Self Care, 16109- Manual therapy, Taping, Dry Needling, Joint mobilization, Spinal mobilization, Scar mobilization, Cryotherapy, and Moist heat  PLAN FOR NEXT SESSION: continued internal treatment to decrease left levator spasms, continued downtraining techniques  Omar Person, PT 01/28/2024, 9:07 AM

## 2024-02-01 ENCOUNTER — Other Ambulatory Visit: Payer: Self-pay | Admitting: Nurse Practitioner

## 2024-02-01 DIAGNOSIS — E039 Hypothyroidism, unspecified: Secondary | ICD-10-CM

## 2024-02-02 ENCOUNTER — Other Ambulatory Visit: Payer: Self-pay

## 2024-02-02 ENCOUNTER — Ambulatory Visit: Payer: Medicaid Other | Admitting: Obstetrics and Gynecology

## 2024-02-02 VITALS — BP 104/71 | HR 75 | Wt 141.0 lb

## 2024-02-02 DIAGNOSIS — R102 Pelvic and perineal pain: Secondary | ICD-10-CM

## 2024-02-02 DIAGNOSIS — F341 Dysthymic disorder: Secondary | ICD-10-CM

## 2024-02-02 DIAGNOSIS — G8929 Other chronic pain: Secondary | ICD-10-CM

## 2024-02-02 DIAGNOSIS — N809 Endometriosis, unspecified: Secondary | ICD-10-CM | POA: Diagnosis not present

## 2024-02-02 NOTE — Progress Notes (Unsigned)
 GYNECOLOGY VISIT  Patient name: Olivia Shepard MRN [id number]  Date of birth: December 12, 1990 Chief Complaint:   Dysmenorrhea   History:  Olivia Shepard is a 33 y.o. [id number] being seen today for pelvic pain.    CPP apin worse with menses. Has been getting LA injections with urogyn. Started having this after her first pregnancy. Has don ePFPT. With each subsequent pregnancy, pain has gotten worse. Wondering what is causing pain.   Discussed the use of AI scribe software for clinical note transcription with the patient, who gave verbal consent to proceed.  History of Present Illness Olivia Shepard is a 33 year old female with chronic pelvic and nerve pain who presents with worsening symptoms post-pregnancy.  She has a long-standing history of pelvic and nerve pain, initially noted after her first pregnancy approximately nine years ago. The pain was initially managed with pelvic physical therapy and resolved within three months. Similar patterns of pain occurred with subsequent pregnancies, with the pain intensifying with each pregnancy, particularly during the third trimester and persisting postpartum.  During her fourth pregnancy, she experienced significant nerve pain radiating down her leg, which was exacerbated by her menstrual cycle. The pain was more intense with her fifth pregnancy, persisting throughout the entire pregnancy and postpartum, despite pelvic PT. Recently, the pain has returned with increased intensity, accompanied by numbness extending to her toes. She describes the pain as a 'constant throbbing' and 'shooting' sensation, with pressure that radiates from her hip down her leg into her feet. She also experiences pain during intercourse, which began after her last pregnancy, and describes a burning sensation similar to a bladder infection during muscle spasms, particularly during her menstrual cycle.  Her treatment history includes pelvic PT, Flexeril taken once or twice daily,  massage, exercises, and a series of six weeks of injections. She has a history of a bulging disc, confirmed by a CT scan, which was treated with a three-week course of steroids without improvement. She has also experienced migraines in the past, which were exacerbated by hormonal birth control, leading to discontinuation of such methods.  Her menstrual history includes irregular periods between pregnancies, with recent changes such as a 12-day period followed by scant bleeding for two and a half days. She has been breastfeeding between pregnancies, with periods resuming three to six months postpartum. She has not used hormonal birth control since her first pregnancy due to adverse effects, including migraines and mood changes.  She works once or twice a week, pumping during work hours. 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     Past Medical History:  Diagnosis Date   Anemia    Anxiety    GERD (gastroesophageal reflux disease)    History of postpartum hemorrhage, currently pregnant    Hypothyroidism    Ruptured disk     Past Surgical History:  Procedure Laterality Date   ESOPHAGEAL DILATION      The following portions of the patient's history were reviewed and updated as appropriate: allergies, current medications, past family history, past medical history, past social history, past surgical history and problem list.   Health Maintenance:   Last pap     Component Value Date/Time   DIAGPAP  12/07/2023 1134    - Negative for intraepithelial lesion or malignancy (NILM)   DIAGPAP (A) 09/27/2021 1157    - Atypical squamous cells of undetermined significance (ASC-US)   HPVHIGH Negative  12/07/2023 1134   HPVHIGH Negative 09/27/2021 1157   ADEQPAP  12/07/2023 1134    Satisfactory for evaluation; transformation zone component PRESENT.   ADEQPAP  09/27/2021 1157    Satisfactory for evaluation; transformation zone  component PRESENT.    High Risk HPV: Positive  Adequacy:  Satisfactory for evaluation, transformation zone component PRESENT  Diagnosis:  Atypical squamous cells of undetermined significance (ASC-US)  Last mammogram: n/a   Review of Systems:  Pertinent items are noted in HPI. Comprehensive review of systems was otherwise negative.   Objective:  Physical Exam BP 104/71   Pulse 75   Wt 141 lb (64 kg)   LMP 01/05/2024 (Exact Date)   Breastfeeding Yes   BMI 22.08 kg/m    Physical Exam Vitals and nursing note reviewed.  Constitutional:      Appearance: Normal appearance.  HENT:     Head: Normocephalic and atraumatic.  Pulmonary:     Effort: Pulmonary effort is normal.  Abdominal:     Comments: - carnett  Musculoskeletal:     Comments: Parapsinal tendernes - FABER test - straight leg test bilaterally    Skin:    General: Skin is warm and dry.  Neurological:     General: No focal deficit present.     Mental Status: She is alert.  Psychiatric:        Mood and Affect: Mood normal.        Behavior: Behavior normal.        Thought Content: Thought content normal.        Judgment: Judgment normal.       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 Differential includes endometriosis, pelvic floor dysfunction, and spinal issues. Bilateral toe numbness suggests central nerve issue. Previous treatments include pelvic physical therapy, Flexeril, massage, and steroid injections with varying relief. Endometriosis considered, but atypical presentation suggests muscular origin. Hesitant to use hormonal treatments due to ethical concerns about ovulation suppression. - Order pelvic MRI to evaluate for structural abnormalities or endometriosis. - Consider follow-up with back specialist for further evaluation of numbness and potential spinal  involvement.  Dysmenorrhea Painful periods during teenage years with irregular cycles between pregnancies. Current periods not consistently painful but associated with pelvic pressure and pain. Ethical concerns about hormonal contraceptives that do not prevent ovulation limit treatment options. - Discuss potential use of combined hormonal contraceptives if husband undergoes vasectomy, allowing CHC use without ethical concerns.  Migraines Migraines exacerbated by estrogen-containing contraceptives, leading to discontinuation of hormonal birth control. Significant side effects, including headaches and mood changes, with prior contraceptive ring use.  Postpartum depression Managed with sertraline 75 mg daily, effectively controlling symptoms. Plans to continue medication for a few more months due to recent stressors. Engaged in therapy at a women's counseling center, which has been beneficial. - Continue sertraline 75 mg daily. - Coordinate with pharmacy to ensure availability of current dosage. - Continue therapy sessions at the women's counseling center.  Follow-up for chronic pelvic pain Requires follow-up for ongoing evaluation and management of chronic pelvic pain and associated symptoms. - Schedule pelvic MRI to assess for any evidence of endometriosis or other contributors to pain - Discuss results and further management options after MRI results are available.  Routine preventative health maintenance measures emphasized.  Lorriane Shire, MD Minimally Invasive Gynecologic Surgery [facility]

## 2024-02-03 ENCOUNTER — Encounter: Payer: Self-pay | Admitting: Obstetrics and Gynecology

## 2024-02-03 ENCOUNTER — Ambulatory Visit: Payer: Medicaid Other | Admitting: Physical Therapy

## 2024-02-03 DIAGNOSIS — R279 Unspecified lack of coordination: Secondary | ICD-10-CM

## 2024-02-03 DIAGNOSIS — M6281 Muscle weakness (generalized): Secondary | ICD-10-CM | POA: Diagnosis not present

## 2024-02-03 DIAGNOSIS — R102 Pelvic and perineal pain unspecified side: Secondary | ICD-10-CM

## 2024-02-03 DIAGNOSIS — R293 Abnormal posture: Secondary | ICD-10-CM

## 2024-02-03 MED ORDER — SERTRALINE HCL 100 MG PO TABS
100.0000 mg | ORAL_TABLET | Freq: Every day | ORAL | 6 refills | Status: AC
Start: 2024-02-03 — End: ?

## 2024-02-03 NOTE — Therapy (Signed)
 OUTPATIENT PHYSICAL THERAPY FEMALE PELVIC TREATMENT   Patient Name: Olivia Shepard MRN: [id number] DOB:1991/02/03, 33 y.o., female Today's Date: 02/03/2024  END OF SESSION:  PT End of Session - 02/03/24 0842     Visit Number 6    Number of Visits 8    Date for PT Re-Evaluation 01/19/24    Authorization Type Medicaid    Authorization Time Period [employer] Approved 4 visits-12/22/2023-01/20/2024    Authorization - Number of Visits 4    PT Start Time 0809    PT Stop Time 0843    PT Time Calculation (min) 34 min    Activity Tolerance Patient tolerated treatment well    Behavior During Therapy WFL for tasks assessed/performed                  Past Medical History:  Diagnosis Date   Anemia    Anxiety    GERD (gastroesophageal reflux disease)    History of postpartum hemorrhage, currently pregnant    Hypothyroidism    Ruptured disk    Past Surgical History:  Procedure Laterality Date   ESOPHAGEAL DILATION     Patient Active Problem List   Diagnosis Date Noted   Protrusion of lumbar intervertebral disc 11/25/2023   Dysphagia 11/14/2020   Pelvic floor dysfunction 10/02/2020   Varicose veins of legs, antepartum 12/19/2019   GERD (gastroesophageal reflux disease) 07/25/2016   Hypothyroidism 07/19/2015   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   IBS (irritable bowel syndrome) 06/29/2012    PCP: Westley Chandler, MD  REFERRING PROVIDER: [name], MD  REFERRING DIAG: [phone] (ICD-10-CM) - Levator spasm  THERAPY DIAG:  Muscle weakness (generalized)  Unspecified lack of coordination  Abnormal posture  Pelvic pain  Rationale for Evaluation and Treatment: Rehabilitation  ONSET DATE: 3 weeks ago, but has happened previously  SUBJECTIVE:                                                                                                                                                                                           SUBJECTIVE  STATEMENT: Patient arrived 9 minutes late to PT session today.  She went to an appointment yesterday and she was told that she might not have endometriosis with her symptoms. She is getting a pelvic MRI in the future. Shooting pain is still present in left leg and behind the left knee. Left hip is still sore in the SI joint area. Urethral pain is better. Intercourse last week which was pleasant. Patient feels more coordination in the musculature in the pelvic floor.   PAIN:  02/03/24: 1/10 down the leg, back of  the hip 3/10   01/28/24: 3-4/10  01/20/24: 3/10  01/13/24: 4-5/10   12/29/23: yes in pain, 4/10  Are you having pain? Yes NPRS scale: 4/10 Pain location: External, Deep, Bilateral, and Posterior  Pain type: aching Pain description: intermittent and sharp   Aggravating factors: as the day passes, pain gets worse  Relieving factors: nothing   PRECAUTIONS: None  RED FLAGS: None   WEIGHT BEARING RESTRICTIONS: No  FALLS:  Has patient fallen in last 6 months? 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   ACTIVITY LEVEL : walking and carrying children   PLOF: Independent  PATIENT GOALS: decrease pain   PERTINENT HISTORY:  5 vaginal deliveries, 3 adopted children Sexual abuse: No  BOWEL MOVEMENT: Pain with bowel movement: No Type of bowel movement:Type (Bristol Stool Scale) 4, Frequency 1x/day, Strain No, and Splinting no Fully empty rectum: Yes:   Leakage: No Pads: No Fiber supplement/laative Yes: miralax if not regular  URINATION: Pain with urination: Yes - some burning with urination that mimics pelvic muscle pain  Fully empty bladder: yes  Stream: Strong Frequency: wnl Leakage: no leakage unless urgency is super high and she cannot hold it   INTERCOURSE:  Ability to have vaginal penetration Yes.   Pain with intercourse: Initial Penetration, During Penetration, and Pain Interrupts Intercourse DrynessNo Climax: no issues   Marinoff Scale: 3/3  PREGNANCY: Vaginal deliveries 5 vaginal deliveries  Tearing Yes: with 1st 3 deliveries Currently pregnant No  PROLAPSE: Pressure to the left side of pelvic floor in the past   OBJECTIVE:  Note: Objective measures were completed at Evaluation unless otherwise noted.  PATIENT SURVEYS:  PFIQ-7: 24  COGNITION: Overall cognitive status: Within functional limits for tasks assessed     SENSATION: Light touch: Appears intact  LUMBAR SPECIAL TESTS:  Single leg stance test: Positive  FUNCTIONAL TESTS:  Squat: bilateral dynamic knee valgus with loading   GAIT: Comments: mild trendelenburg gait pattern with ambulation   POSTURE: rounded shoulders and forward head   LUMBARAROM/PROM: WNL  A/PROM A/PROM  eval  Flexion   Extension   Right lateral flexion   Left lateral flexion   Right rotation   Left rotation    (Blank rows = not tested)  LOWER EXTREMITY ROM:  Active ROM Right eval Left eval  Hip flexion    Hip extension    Hip abduction    Hip adduction    Hip internal rotation    Hip external rotation    Knee flexion    Knee extension    Ankle dorsiflexion    Ankle plantarflexion    Ankle inversion    Ankle eversion     (Blank rows = not tested)  LOWER EXTREMITY MMT: 4-/5 grossly bilateral hips, bilateral knees 4/5   MMT Right eval Left eval  Hip flexion    Hip extension    Hip abduction    Hip adduction    Hip internal rotation    Hip external rotation    Knee flexion    Knee extension    Ankle dorsiflexion    Ankle plantarflexion    Ankle inversion    Ankle eversion     (Blank rows = not tested) PALPATION: General: no tenderness to palpation of adductors/hip flexors, some discomfort with pubic bone palpation  Pelvic Alignment: within normal limits   Abdominal: decreased rib mobility with inhalation  External Perineal Exam: increased tenderness with palpation of left sided pelvic floor musculature                               Internal Pelvic Floor: left>right levator muscle tension, no trigger points noted, general overactivity found throughout entirety of pelvic floor musculature   Patient confirms identification and approves PT to assess internal pelvic floor and treatment Yes No emotional/communication barriers or cognitive limitation. Patient is motivated to learn. Patient understands and agrees with treatment goals and plan. PT explains patient will be examined in standing, sitting, and lying down to see how their muscles and joints work. When they are ready, they will be asked to remove their underwear so PT can examine their perineum. The patient is also given the option of providing their own chaperone as one is not provided in our facility. The patient also has the right and is explained the right to defer or refuse any part of the evaluation or treatment including the internal exam. With the patient's consent, PT will use one gloved finger to gently assess the muscles of the pelvic floor, seeing how well it contracts and relaxes and if there is muscle symmetry. After, the patient will get dressed and PT and patient will discuss exam findings and plan of care. PT and patient discuss plan of care, schedule, attendance policy and HEP activities.  PELVIC MMT:   MMT eval  Vaginal 3/5, generally increased muscle tone throughout  Internal Anal Sphincter   External Anal Sphincter   Puborectalis   Diastasis Recti   (Blank rows = not tested)        TONE: Increased muscle tension noted throughout pelvic floor musculature layers 1/2/3 bilateral   PROLAPSE: Anterior vaginal wall laxity with cough test   TODAY'S TREATMENT:                                                                                                                              DATE:   01/20/24: Manual therapy  Internal left sided levator ani muscle release and manual stretching with inhalation  Trigger Point Dry  Needling Subsequent Treatment: Instructions provided previously at initial dry needling treatment.  Patient Verbal Consent Given: Yes Education Handout Provided: Previously Provided Muscles Treated: gluteus max, gluteus med, piriformis bilaterally  Electrical Stimulation Performed: No Treatment Response/Outcome: twitch response present in Left sided glute med; no pain following treatment. Neuro re-ed: Hooklying diaphragmatic breathing with pelvic floor lengthening and shortening with inhalation/exhalation 3x10  Foam roll sit for pelvic floor lengthening and relaxation 2x34min  Happy baby + diaphragmatic breathing for pelvic floor relaxation 2x67min  01/28/24: Manual therapy  Internal left sided levator ani muscle release and manual stretching with inhalation  Trigger Point Dry Needling Subsequent Treatment: Instructions provided previously at initial dry needling treatment.  Patient Verbal Consent Given: Yes Education Handout Provided: Previously Provided Muscles Treated: gluteus max, gluteus med,  piriformis bilaterally, lumbar multifidi, iliocostalis lumborum  Electrical Stimulation Performed: No Treatment Response/Outcome: twitch response present in Left sided glute med; no pain following treatment. Lumbar grade 1/2 mobilizations of spine to decrease nerve pain down left leg  Neuro re-ed: SEATED diaphragmatic breathing with pelvic floor lengthening and shortening with inhalation/exhalation 3x10  Seated diaphragmatic breathing + pelvic floor quick flick contractions + diaphragmatic breathing 2x10   02/03/24: Manual therapy  Internal left sided levator ani muscle release and manual stretching with inhalation  Trigger Point Dry Needling Subsequent Treatment: Instructions provided previously at initial dry needling treatment.  Patient Verbal Consent Given: Yes Education Handout Provided: Previously Provided Muscles Treated: gluteus max, gluteus med, piriformis bilaterally Electrical  Stimulation Performed: No Treatment Response/Outcome: twitch response present in Left sided glute med; no pain following treatment. Neuro re-ed: SEATED diaphragmatic breathing with pelvic floor lengthening and shortening with inhalation/exhalation 3x10  Seated diaphragmatic breathing + pelvic floor quick flick contractions + diaphragmatic breathing 2x10  Quadruped hip hikes on yoga block + diaphragmatic breathing 2x10 each side (to mobilize SI joint) Lateral step ups on 6" step (left side only) + diaphragmatic breathing 2x10 each  Rocking hamstring/hip flexor stretch + diaphragmatic breathing 2x10 each  Self care  Intraabdominal pressure management during pelvic floor exercises and how to manage this to decrease pelvic pain    PATIENT EDUCATION:  Education details: pelvic floor muscle active range of motion, diaphragmatic breathing and the connection to the pelvic floor, vaginal moisturizers  Person educated: Patient Education method: Explanation, Demonstration, Tactile cues, Verbal cues, and Handouts Education comprehension: verbalized understanding, returned demonstration, verbal cues required, and tactile cues required  HOME EXERCISE PROGRAM: Access Code: [email or web address] URL: [email or web address] Date: 02/03/2024 Prepared by: Earna Coder  Exercises - Seated Pelvic Floor Contraction  - 1 x daily - 7 x weekly - 2 sets - 10 reps - Seated Quick Flick Pelvic Floor Contractions  - 1 x daily - 7 x weekly - 2 sets - 10 reps - Quadruped Hip Hike on Foam  - 1 x daily - 7 x weekly - 2 sets - 10 reps - Lateral Step Up  - 1 x daily - 7 x weekly - 2 sets - 10 reps - Half Kneeling ITB/TFL Stretch  - 1 x daily - 7 x weekly - 2 sets - 10 reps  ASSESSMENT:  CLINICAL IMPRESSION: Patient is a 33 y.o. female  who was seen today for physical therapy treatment for pelvic pain that travels down bilateral posterior legs and is more prominent on the left side. She has attended 6 sessions of pelvic  PT thus far and is feeling better each week, in conjunction with levator injections. Her pelvic pain has been much better overall, but she reports 3/10 discomfort today primarily in the left sacroiliac joint and down the left leg. Dry needling performed to bilateral glutes and lumbar spine and patient reports significant improvement in tension following needling. Patient provided exercises to mobilize left sacroiliac joint at home and she reports feeling better after trying these. No pain at end of session, patient reports feeling much better than when she arrived today. Pt would benefit from additional PT to further address remaining pelvic pain and lack of coordination in the pelvic floor.  OBJECTIVE IMPAIRMENTS: decreased coordination, decreased endurance, decreased mobility, decreased ROM, decreased strength, and pain.   ACTIVITY LIMITATIONS: lifting  PARTICIPATION LIMITATIONS:  vaginal penetration   PERSONAL FACTORS: Past/current experiences and Time since onset of injury/illness/exacerbation are also affecting patient's functional outcome.  REHAB POTENTIAL: Good  CLINICAL DECISION MAKING: Stable/uncomplicated  EVALUATION COMPLEXITY: Low   GOALS: Goals reviewed with patient? Yes  SHORT TERM GOALS: Target date: 01/19/2024  Pt will be independent with HEP.  Baseline: Goal status: GOAL MET 01/20/24  2.  Pt will be independent with diaphragmatic breathing and down training activities in order to improve pelvic floor relaxation. Baseline:  Goal status: GOAL MET 01/20/24  3.  Pt will be able to correctly perform diaphragmatic breathing and appropriate pressure management in order to prevent worsening vaginal wall laxity and improve pelvic floor A/ROM.  Baseline:  Goal status: GOAL MET 01/20/24  LONG TERM GOALS: Target date: 06/20/2024  Pt will be independent with advanced HEP.  Baseline:  Goal status: GOAL MET 01/20/24  2.  Pt will demonstrate normal pelvic floor muscle tone and  A/ROM, able to achieve 4/5 strength with contractions and 10 sec endurance, in order to provide appropriate lumbopelvic support in functional activities.  Baseline:  Goal status: ONGOING 01/19/14  3.  Pt to demonstrate improved coordination of pelvic floor and breathing mechanics with 10# squat with appropriate synergistic patterns to decrease pain at least 75% of the time.   Baseline:  Goal status: ONGOING 01/19/14  4.  Pt will report PFIQ-7 score of less than 24 points to suggest decreased functional limitations secondary to pelvic pain to improve quality of life.  Baseline:  Goal status: GOAL MET 01/20/24  PLAN:  PT FREQUENCY: 1x/week  PT DURATION: 8 weeks  PLANNED INTERVENTIONS: 97110-Therapeutic exercises, 97530- Therapeutic activity, 97112- Neuromuscular re-education, 97535- Self Care, 16109- Manual therapy, Taping, Dry Needling, Joint mobilization, Spinal mobilization, Scar mobilization, Cryotherapy, and Moist heat  PLAN FOR NEXT SESSION: continued internal treatment to decrease left levator spasms, continued downtraining techniques  Omar Person, PT 02/03/2024, 8:46 AM

## 2024-02-03 NOTE — Addendum Note (Signed)
 Addended by: Omar Person on: 02/03/2024 02:34 PM   Modules accepted: Orders

## 2024-02-10 ENCOUNTER — Ambulatory Visit: Payer: Medicaid Other | Attending: Obstetrics and Gynecology | Admitting: Physical Therapy

## 2024-02-10 DIAGNOSIS — R293 Abnormal posture: Secondary | ICD-10-CM | POA: Insufficient documentation

## 2024-02-10 DIAGNOSIS — R102 Pelvic and perineal pain: Secondary | ICD-10-CM | POA: Insufficient documentation

## 2024-02-10 DIAGNOSIS — M6281 Muscle weakness (generalized): Secondary | ICD-10-CM | POA: Diagnosis not present

## 2024-02-10 DIAGNOSIS — R279 Unspecified lack of coordination: Secondary | ICD-10-CM | POA: Diagnosis not present

## 2024-02-10 NOTE — Therapy (Signed)
 OUTPATIENT PHYSICAL THERAPY FEMALE PELVIC TREATMENT/REASSESSMENT   Patient Name: Olivia Shepard MRN: [id number] DOB:Mar 30, 1991, 33 y.o., female Today's Date: 02/10/2024  END OF SESSION:  PT End of Session - 02/10/24 1057     Visit Number 7    Number of Visits 8    Date for PT Re-Evaluation 01/19/24    Authorization Type Medicaid    Authorization Time Period [employer] Approved 4 visits-12/22/2023-01/20/2024    Authorization - Number of Visits 4    PT Start Time 0945    PT Stop Time 1017    PT Time Calculation (min) 32 min    Activity Tolerance Patient tolerated treatment well    Behavior During Therapy WFL for tasks assessed/performed                   Past Medical History:  Diagnosis Date   Anemia    Anxiety    GERD (gastroesophageal reflux disease)    History of postpartum hemorrhage, currently pregnant    Hypothyroidism    Ruptured disk    Past Surgical History:  Procedure Laterality Date   ESOPHAGEAL DILATION     Patient Active Problem List   Diagnosis Date Noted   Protrusion of lumbar intervertebral disc 11/25/2023   Dysphagia 11/14/2020   Pelvic floor dysfunction 10/02/2020   Varicose veins of legs, antepartum 12/19/2019   GERD (gastroesophageal reflux disease) 07/25/2016   Hypothyroidism 07/19/2015   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   IBS (irritable bowel syndrome) 06/29/2012    PCP: Westley Chandler, MD  REFERRING PROVIDER: [name], MD  REFERRING DIAG: [phone] (ICD-10-CM) - Levator spasm  THERAPY DIAG:  Muscle weakness (generalized)  Unspecified lack of coordination  Abnormal posture  Pelvic pain  Rationale for Evaluation and Treatment: Rehabilitation  ONSET DATE: 3 weeks ago, but has happened previously  SUBJECTIVE:                                                                                                                                                                                           SUBJECTIVE  STATEMENT: Patient had a period two weeks ago, but she started bleeding again yesterday, unsure why. She is experiencing a light to medium flow. She has an MRI for her pelvis on the 10th of April. She is still experiencing pain down the left leg posteriorly.  Left hip has been feeling better. Intercourse has been pleasant recently. Exercises seem to be helping pain when period is not present, but when period is present, she feels a big pressure sensation right on her left sits bone that causes radiating pain down the left  leg posteriorly.   PAIN:  02/10/24: 5/10 down the leg  02/03/24: 1/10 down the leg, back of the hip 3/10   01/28/24: 3-4/10  01/20/24: 3/10  01/13/24: 4-5/10   12/29/23: yes in pain, 4/10  Are you having pain? Yes NPRS scale: 4/10 Pain location: External, Deep, Bilateral, and Posterior  Pain type: aching Pain description: intermittent and sharp   Aggravating factors: as the day passes, pain gets worse  Relieving factors: nothing   PRECAUTIONS: None  RED FLAGS: None   WEIGHT BEARING RESTRICTIONS: No  FALLS:  Has patient fallen in last 6 months? 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   ACTIVITY LEVEL : walking and carrying children   PLOF: Independent  PATIENT GOALS: decrease pain   PERTINENT HISTORY:  5 vaginal deliveries, 3 adopted children Sexual abuse: No  BOWEL MOVEMENT: Pain with bowel movement: No Type of bowel movement:Type (Bristol Stool Scale) 4, Frequency 1x/day, Strain No, and Splinting no Fully empty rectum: Yes:   Leakage: No Pads: No Fiber supplement/laative Yes: miralax if not regular  URINATION: Pain with urination: Yes - some burning with urination that mimics pelvic muscle pain  Fully empty bladder: yes  Stream: Strong Frequency: wnl Leakage: no leakage unless urgency is super high and she cannot hold it   INTERCOURSE:  Ability to have vaginal penetration Yes.   Pain with intercourse:  Initial Penetration, During Penetration, and Pain Interrupts Intercourse DrynessNo Climax: no issues  Marinoff Scale: 3/3  PREGNANCY: Vaginal deliveries 5 vaginal deliveries  Tearing Yes: with 1st 3 deliveries Currently pregnant No  PROLAPSE: Pressure to the left side of pelvic floor in the past   OBJECTIVE:  Note: Objective measures were completed at Evaluation unless otherwise noted.  PATIENT SURVEYS:  PFIQ-7: 24 POPIQ-7: 43 - in relation to the pain down the leg   COGNITION: Overall cognitive status: Within functional limits for tasks assessed     SENSATION: Light touch: Appears intact  LUMBAR SPECIAL TESTS:  Single leg stance test: Positive  FUNCTIONAL TESTS:  Squat: bilateral dynamic knee valgus with loading   GAIT: Comments: mild trendelenburg gait pattern with ambulation   POSTURE: rounded shoulders and forward head   LUMBARAROM/PROM: WNL  A/PROM A/PROM  eval  Flexion   Extension   Right lateral flexion   Left lateral flexion   Right rotation   Left rotation    (Blank rows = not tested)  LOWER EXTREMITY ROM:  Active ROM Right eval Left eval  Hip flexion    Hip extension    Hip abduction    Hip adduction    Hip internal rotation    Hip external rotation    Knee flexion    Knee extension    Ankle dorsiflexion    Ankle plantarflexion    Ankle inversion    Ankle eversion     (Blank rows = not tested)  LOWER EXTREMITY MMT: 4-/5 grossly bilateral hips, bilateral knees 4/5   MMT Right eval Left eval  Hip flexion    Hip extension    Hip abduction    Hip adduction    Hip internal rotation    Hip external rotation    Knee flexion    Knee extension    Ankle dorsiflexion    Ankle plantarflexion    Ankle inversion    Ankle eversion     (Blank rows = not tested) PALPATION: General: no tenderness to palpation of adductors/hip flexors,  some discomfort with pubic bone palpation  Pelvic Alignment: within normal limits   Abdominal:  decreased rib mobility with inhalation                 External Perineal Exam: increased tenderness with palpation of left sided pelvic floor musculature                              Internal Pelvic Floor: left>right levator muscle tension, no trigger points noted, general overactivity found throughout entirety of pelvic floor musculature   Patient confirms identification and approves PT to assess internal pelvic floor and treatment Yes No emotional/communication barriers or cognitive limitation. Patient is motivated to learn. Patient understands and agrees with treatment goals and plan. PT explains patient will be examined in standing, sitting, and lying down to see how their muscles and joints work. When they are ready, they will be asked to remove their underwear so PT can examine their perineum. The patient is also given the option of providing their own chaperone as one is not provided in our facility. The patient also has the right and is explained the right to defer or refuse any part of the evaluation or treatment including the internal exam. With the patient's consent, PT will use one gloved finger to gently assess the muscles of the pelvic floor, seeing how well it contracts and relaxes and if there is muscle symmetry. After, the patient will get dressed and PT and patient will discuss exam findings and plan of care. PT and patient discuss plan of care, schedule, attendance policy and HEP activities.  PELVIC MMT:   MMT eval Reassassment day 02/10/24  Vaginal 3/5, generally increased muscle tone throughout   Internal Anal Sphincter    External Anal Sphincter    Puborectalis    Diastasis Recti    (Blank rows = not tested)        TONE: Increased muscle tension noted throughout pelvic floor musculature layers 1/2/3 bilateral   PROLAPSE: Anterior vaginal wall laxity with cough test   TODAY'S TREATMENT:                                                                                                                               DATE:   01/28/24: Manual therapy  Internal left sided levator ani muscle release and manual stretching with inhalation  Trigger Point Dry Needling Subsequent Treatment: Instructions provided previously at initial dry needling treatment.  Patient Verbal Consent Given: Yes Education Handout Provided: Previously Provided Muscles Treated: gluteus max, gluteus med, piriformis bilaterally, lumbar multifidi, iliocostalis lumborum  Electrical Stimulation Performed: No Treatment Response/Outcome: twitch response present in Left sided glute med; no pain following treatment. Lumbar grade 1/2 mobilizations of spine to decrease nerve pain down left leg  Neuro re-ed: SEATED diaphragmatic breathing with pelvic floor lengthening and shortening with inhalation/exhalation 3x10  Seated diaphragmatic breathing + pelvic floor quick flick  contractions + diaphragmatic breathing 2x10   02/03/24: Manual therapy  Internal left sided levator ani muscle release and manual stretching with inhalation  Trigger Point Dry Needling Subsequent Treatment: Instructions provided previously at initial dry needling treatment.  Patient Verbal Consent Given: Yes Education Handout Provided: Previously Provided Muscles Treated: gluteus max, gluteus med, piriformis bilaterally Electrical Stimulation Performed: No Treatment Response/Outcome: twitch response present in Left sided glute med; no pain following treatment. Neuro re-ed: SEATED diaphragmatic breathing with pelvic floor lengthening and shortening with inhalation/exhalation 3x10  Seated diaphragmatic breathing + pelvic floor quick flick contractions + diaphragmatic breathing 2x10  Quadruped hip hikes on yoga block + diaphragmatic breathing 2x10 each side (to mobilize SI joint) Lateral step ups on 6" step (left side only) + diaphragmatic breathing 2x10 each  Rocking hamstring/hip flexor stretch + diaphragmatic breathing 2x10 each  Self  care  Intraabdominal pressure management during pelvic floor exercises and how to manage this to decrease pelvic pain  02/10/24: reassessment day  Manual therapy  Internal left sided levator ani muscle release and manual stretching with inhalation  Trigger Point Dry Needling Subsequent Treatment: Instructions provided previously at initial dry needling treatment.  Patient Verbal Consent Given: Yes Education Handout Provided: Previously Provided Muscles Treated: left gluteus medius, gluteus maximus  Electrical Stimulation Performed: No Treatment Response/Outcome: twitch response present in Left sided glute med; no pain following treatment. Neuro re-ed: SEATED diaphragmatic breathing with pelvic floor lengthening and shortening with inhalation/exhalation 3x10  Seated diaphragmatic breathing + pelvic floor quick flick contractions + diaphragmatic breathing 2x10  Quadruped hip hikes on yoga block + diaphragmatic breathing 2x10 each side (to mobilize SI joint) Lateral step ups on 6" step (left side only) + diaphragmatic breathing 2x10 each  Rocking hamstring/hip flexor stretch + diaphragmatic breathing 2x10 each  Sidelying clamshell + reverse clamshell + diaphragmatic breathing 2x10 each  Self care  Intraabdominal pressure management during pelvic floor exercises and how to manage this to decrease pelvic pain    PATIENT EDUCATION:  Education details: pelvic floor muscle active range of motion, diaphragmatic breathing and the connection to the pelvic floor, vaginal moisturizers  Person educated: Patient Education method: Explanation, Demonstration, Tactile cues, Verbal cues, and Handouts Education comprehension: verbalized understanding, returned demonstration, verbal cues required, and tactile cues required  HOME EXERCISE PROGRAM: Access Code: [email or web address] URL: [email or web address] Date: 02/10/2024 Prepared by: Earna Coder  Exercises - Seated Pelvic Floor Contraction  - 1 x  daily - 7 x weekly - 2 sets - 10 reps - Seated Quick Flick Pelvic Floor Contractions  - 1 x daily - 7 x weekly - 2 sets - 10 reps - Quadruped Hip Hike on Foam  - 1 x daily - 7 x weekly - 2 sets - 10 reps - Lateral Step Up  - 1 x daily - 7 x weekly - 2 sets - 10 reps - Half Kneeling ITB/TFL Stretch  - 1 x daily - 7 x weekly - 2 sets - 10 reps - Clamshell  - 1 x daily - 7 x weekly - 2 sets - 10 reps - Sidelying Reverse Clamshell  - 1 x daily - 7 x weekly - 2 sets - 10 reps  ASSESSMENT:  CLINICAL IMPRESSION: Patient is a 33 y.o. female  who was seen today for physical therapy treatment for pelvic pain that travels down bilateral posterior legs and is more prominent on the left side. She has attended 7 sessions of pelvic PT thus far and is feeling better each week, although  she is still experiencing deep left sided pelvic floor tension that causes radiating pain down her left leg. Her pelvic pain has been much better overall, but she reports 5/10 discomfort today primarily in the left sacroiliac joint and down the left leg. Clamshell/reverse clamshell exercises added to HEP to start strengthening her hip rotators as this action seems to be the source of her left sided pelvic pain. No pain at end of session, patient reports feeling much better than when she arrived today. Pt would benefit from additional PT to further address remaining pelvic pain and lack of coordination in the pelvic floor.  OBJECTIVE IMPAIRMENTS: decreased coordination, decreased endurance, decreased mobility, decreased ROM, decreased strength, and pain.   ACTIVITY LIMITATIONS: lifting  PARTICIPATION LIMITATIONS:  vaginal penetration   PERSONAL FACTORS: Past/current experiences and Time since onset of injury/illness/exacerbation are also affecting patient's functional outcome.   REHAB POTENTIAL: Good  CLINICAL DECISION MAKING: Stable/uncomplicated  EVALUATION COMPLEXITY: Low   GOALS: Goals reviewed with patient?  Yes  SHORT TERM GOALS: Target date: 01/19/2024  Pt will be independent with HEP.  Baseline: Goal status: GOAL MET 01/20/24  2.  Pt will be independent with diaphragmatic breathing and down training activities in order to improve pelvic floor relaxation. Baseline:  Goal status: GOAL MET 01/20/24  3.  Pt will be able to correctly perform diaphragmatic breathing and appropriate pressure management in order to prevent worsening vaginal wall laxity and improve pelvic floor A/ROM.  Baseline:  Goal status: GOAL MET 01/20/24  LONG TERM GOALS: Target date: 06/20/2024  Pt will be independent with advanced HEP.  Baseline:  Goal status: GOAL MET 01/20/24  2.  Pt will demonstrate normal pelvic floor muscle tone and A/ROM, able to achieve 4/5 strength with contractions and 10 sec endurance, in order to provide appropriate lumbopelvic support in functional activities.  Baseline:  Goal status: ONGOING 01/19/14  3.  Pt to demonstrate improved coordination of pelvic floor and breathing mechanics with 10# squat with appropriate synergistic patterns to decrease pain at least 75% of the time.   Baseline:  Goal status: ONGOING 01/19/14  4.  Pt will report PFIQ-7 score of less than 24 points to suggest decreased functional limitations secondary to pelvic pain to improve quality of life.  Baseline:  Goal status: GOAL MET 01/20/24  5. Patient will report < or = to 2/10 pain in left side of pelvic floor and down left lower extremity to improve quality of life.   Baseline: 5/10  Goal status: ONGOING 02/10/24  PLAN:  PT FREQUENCY: 1x/week  PT DURATION: 8 weeks  PLANNED INTERVENTIONS: 97110-Therapeutic exercises, 97530- Therapeutic activity, 97112- Neuromuscular re-education, 97535- Self Care, 78469- Manual therapy, Taping, Dry Needling, Joint mobilization, Spinal mobilization, Scar mobilization, Cryotherapy, and Moist heat  PLAN FOR NEXT SESSION: continued internal treatment to decrease left levator spasms,  continued downtraining techniques  Omar Person, PT 02/10/2024, 10:58 AM

## 2024-02-17 ENCOUNTER — Ambulatory Visit: Payer: Medicaid Other | Admitting: Physical Therapy

## 2024-02-17 DIAGNOSIS — R279 Unspecified lack of coordination: Secondary | ICD-10-CM

## 2024-02-17 DIAGNOSIS — R293 Abnormal posture: Secondary | ICD-10-CM

## 2024-02-17 DIAGNOSIS — M6281 Muscle weakness (generalized): Secondary | ICD-10-CM

## 2024-02-17 NOTE — Therapy (Signed)
 OUTPATIENT PHYSICAL THERAPY FEMALE PELVIC TREATMENT/REASSESSMENT   Patient Name: Olivia Shepard MRN: [id number] DOB:[birth year], 33 y.o., female Today's Date: 02/17/2024  END OF SESSION:          Past Medical History:  Diagnosis Date   Anemia    Anxiety    GERD (gastroesophageal reflux disease)    History of postpartum hemorrhage, currently pregnant    Hypothyroidism    Ruptured disk    Past Surgical History:  Procedure Laterality Date   ESOPHAGEAL DILATION     Patient Active Problem List   Diagnosis Date Noted   Protrusion of lumbar intervertebral disc 11/25/2023   Dysphagia 11/14/2020   Pelvic floor dysfunction 10/02/2020   Varicose veins of legs, antepartum 12/19/2019   GERD (gastroesophageal reflux disease) 07/25/2016   Hypothyroidism 07/19/2015   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   IBS (irritable bowel syndrome) 06/29/2012    PCP: Westley Chandler, MD  REFERRING PROVIDER: [name], MD  REFERRING DIAG: [id number] (ICD-10-CM) - Levator spasm  THERAPY DIAG:  Muscle weakness (generalized)  Unspecified lack of coordination  Abnormal posture  Rationale for Evaluation and Treatment: Rehabilitation  ONSET DATE: 3 weeks ago, but has happened previously  SUBJECTIVE:                                                                                                                                                                                           SUBJECTIVE STATEMENT: Patient arrived to PT today prior to insurance authorization approving today's visit. Patient was informed of this at beginning of today's session and no treatment was provided. Patient was understanding of the situation and we plan to call the patient prior to her next visit to ensure authorization is established.   From last session approved: Patient had a period two weeks ago, but she started bleeding again yesterday, unsure why. She is experiencing a light to medium  flow. She has an MRI for her pelvis on the 10th of April. She is still experiencing pain down the left leg posteriorly.  Left hip has been feeling better. Intercourse has been pleasant recently. Exercises seem to be helping pain when period is not present, but when period is present, she feels a big pressure sensation right on her left sits bone that causes radiating pain down the left leg posteriorly.   PAIN:  02/10/24: 5/10 down the leg  02/03/24: 1/10 down the leg, back of the hip 3/10   01/28/24: 3-4/10  01/20/24: 3/10  01/13/24: 4-5/10   12/29/23: yes in pain, 4/10  Are you having pain? Yes NPRS scale: 4/10 Pain location: External, Deep, Bilateral,  and Posterior  Pain type: aching Pain description: intermittent and sharp   Aggravating factors: as the day passes, pain gets worse  Relieving factors: nothing   PRECAUTIONS: None  RED FLAGS: None   WEIGHT BEARING RESTRICTIONS: No  FALLS:  Has patient fallen in last 6 months? 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   ACTIVITY LEVEL : walking and carrying children   PLOF: Independent  PATIENT GOALS: decrease pain   PERTINENT HISTORY:  5 vaginal deliveries, 3 adopted children Sexual abuse: No  BOWEL MOVEMENT: Pain with bowel movement: No Type of bowel movement:Type (Bristol Stool Scale) 4, Frequency 1x/day, Strain No, and Splinting no Fully empty rectum: Yes:   Leakage: No Pads: No Fiber supplement/laative Yes: miralax if not regular  URINATION: Pain with urination: Yes - some burning with urination that mimics pelvic muscle pain  Fully empty bladder: yes  Stream: Strong Frequency: wnl Leakage: no leakage unless urgency is super high and she cannot hold it   INTERCOURSE:  Ability to have vaginal penetration Yes.   Pain with intercourse: Initial Penetration, During Penetration, and Pain Interrupts Intercourse DrynessNo Climax: no issues  Marinoff Scale:  3/3  PREGNANCY: Vaginal deliveries 5 vaginal deliveries  Tearing Yes: with 1st 3 deliveries Currently pregnant No  PROLAPSE: Pressure to the left side of pelvic floor in the past   OBJECTIVE:  Note: Objective measures were completed at Evaluation unless otherwise noted.  PATIENT SURVEYS:  PFIQ-7: 24 POPIQ-7: 43 - in relation to the pain down the leg   COGNITION: Overall cognitive status: Within functional limits for tasks assessed     SENSATION: Light touch: Appears intact  LUMBAR SPECIAL TESTS:  Single leg stance test: Positive  FUNCTIONAL TESTS:  Squat: bilateral dynamic knee valgus with loading   GAIT: Comments: mild trendelenburg gait pattern with ambulation   POSTURE: rounded shoulders and forward head   LUMBARAROM/PROM: WNL  A/PROM A/PROM  eval  Flexion   Extension   Right lateral flexion   Left lateral flexion   Right rotation   Left rotation    (Blank rows = not tested)  LOWER EXTREMITY ROM:  Active ROM Right eval Left eval  Hip flexion    Hip extension    Hip abduction    Hip adduction    Hip internal rotation    Hip external rotation    Knee flexion    Knee extension    Ankle dorsiflexion    Ankle plantarflexion    Ankle inversion    Ankle eversion     (Blank rows = not tested)  LOWER EXTREMITY MMT: 4-/5 grossly bilateral hips, bilateral knees 4/5   MMT Right eval Left eval  Hip flexion    Hip extension    Hip abduction    Hip adduction    Hip internal rotation    Hip external rotation    Knee flexion    Knee extension    Ankle dorsiflexion    Ankle plantarflexion    Ankle inversion    Ankle eversion     (Blank rows = not tested) PALPATION: General: no tenderness to palpation of adductors/hip flexors, some discomfort with pubic bone palpation  Pelvic Alignment: within normal limits   Abdominal: decreased rib mobility with inhalation                 External Perineal Exam: increased tenderness with palpation of left  sided pelvic floor musculature  Internal Pelvic Floor: left>right levator muscle tension, no trigger points noted, general overactivity found throughout entirety of pelvic floor musculature   Patient confirms identification and approves PT to assess internal pelvic floor and treatment Yes No emotional/communication barriers or cognitive limitation. Patient is motivated to learn. Patient understands and agrees with treatment goals and plan. PT explains patient will be examined in standing, sitting, and lying down to see how their muscles and joints work. When they are ready, they will be asked to remove their underwear so PT can examine their perineum. The patient is also given the option of providing their own chaperone as one is not provided in our facility. The patient also has the right and is explained the right to defer or refuse any part of the evaluation or treatment including the internal exam. With the patient's consent, PT will use one gloved finger to gently assess the muscles of the pelvic floor, seeing how well it contracts and relaxes and if there is muscle symmetry. After, the patient will get dressed and PT and patient will discuss exam findings and plan of care. PT and patient discuss plan of care, schedule, attendance policy and HEP activities.  PELVIC MMT:   MMT eval Reassassment day 02/10/24  Vaginal 3/5, generally increased muscle tone throughout   Internal Anal Sphincter    External Anal Sphincter    Puborectalis    Diastasis Recti    (Blank rows = not tested)        TONE: Increased muscle tension noted throughout pelvic floor musculature layers 1/2/3 bilateral   PROLAPSE: Anterior vaginal wall laxity with cough test   TODAY'S TREATMENT:                                                                                                                              DATE:   01/28/24: Manual therapy  Internal left sided levator ani muscle release  and manual stretching with inhalation  Trigger Point Dry Needling Subsequent Treatment: Instructions provided previously at initial dry needling treatment.  Patient Verbal Consent Given: Yes Education Handout Provided: Previously Provided Muscles Treated: gluteus max, gluteus med, piriformis bilaterally, lumbar multifidi, iliocostalis lumborum  Electrical Stimulation Performed: No Treatment Response/Outcome: twitch response present in Left sided glute med; no pain following treatment. Lumbar grade 1/2 mobilizations of spine to decrease nerve pain down left leg  Neuro re-ed: SEATED diaphragmatic breathing with pelvic floor lengthening and shortening with inhalation/exhalation 3x10  Seated diaphragmatic breathing + pelvic floor quick flick contractions + diaphragmatic breathing 2x10   02/03/24: Manual therapy  Internal left sided levator ani muscle release and manual stretching with inhalation  Trigger Point Dry Needling Subsequent Treatment: Instructions provided previously at initial dry needling treatment.  Patient Verbal Consent Given: Yes Education Handout Provided: Previously Provided Muscles Treated: gluteus max, gluteus med, piriformis bilaterally Electrical Stimulation Performed: No Treatment Response/Outcome: twitch response present in Left sided glute med; no pain following treatment. Neuro re-ed: SEATED diaphragmatic  breathing with pelvic floor lengthening and shortening with inhalation/exhalation 3x10  Seated diaphragmatic breathing + pelvic floor quick flick contractions + diaphragmatic breathing 2x10  Quadruped hip hikes on yoga block + diaphragmatic breathing 2x10 each side (to mobilize SI joint) Lateral step ups on 6" step (left side only) + diaphragmatic breathing 2x10 each  Rocking hamstring/hip flexor stretch + diaphragmatic breathing 2x10 each  Self care  Intraabdominal pressure management during pelvic floor exercises and how to manage this to decrease pelvic pain   02/10/24: reassessment day  Manual therapy  Internal left sided levator ani muscle release and manual stretching with inhalation  Trigger Point Dry Needling Subsequent Treatment: Instructions provided previously at initial dry needling treatment.  Patient Verbal Consent Given: Yes Education Handout Provided: Previously Provided Muscles Treated: left gluteus medius, gluteus maximus  Electrical Stimulation Performed: No Treatment Response/Outcome: twitch response present in Left sided glute med; no pain following treatment. Neuro re-ed: SEATED diaphragmatic breathing with pelvic floor lengthening and shortening with inhalation/exhalation 3x10  Seated diaphragmatic breathing + pelvic floor quick flick contractions + diaphragmatic breathing 2x10  Quadruped hip hikes on yoga block + diaphragmatic breathing 2x10 each side (to mobilize SI joint) Lateral step ups on 6" step (left side only) + diaphragmatic breathing 2x10 each  Rocking hamstring/hip flexor stretch + diaphragmatic breathing 2x10 each  Sidelying clamshell + reverse clamshell + diaphragmatic breathing 2x10 each  Self care  Intraabdominal pressure management during pelvic floor exercises and how to manage this to decrease pelvic pain    PATIENT EDUCATION:  Education details: pelvic floor muscle active range of motion, diaphragmatic breathing and the connection to the pelvic floor, vaginal moisturizers  Person educated: Patient Education method: Explanation, Demonstration, Tactile cues, Verbal cues, and Handouts Education comprehension: verbalized understanding, returned demonstration, verbal cues required, and tactile cues required  HOME EXERCISE PROGRAM: Access Code: [email or web address] URL: [email or web address] Date: 02/10/2024 Prepared by: Earna Coder  Exercises - Seated Pelvic Floor Contraction  - 1 x daily - 7 x weekly - 2 sets - 10 reps - Seated Quick Flick Pelvic Floor Contractions  - 1 x daily - 7 x weekly - 2 sets -  10 reps - Quadruped Hip Hike on Foam  - 1 x daily - 7 x weekly - 2 sets - 10 reps - Lateral Step Up  - 1 x daily - 7 x weekly - 2 sets - 10 reps - Half Kneeling ITB/TFL Stretch  - 1 x daily - 7 x weekly - 2 sets - 10 reps - Clamshell  - 1 x daily - 7 x weekly - 2 sets - 10 reps - Sidelying Reverse Clamshell  - 1 x daily - 7 x weekly - 2 sets - 10 reps  ASSESSMENT:  CLINICAL IMPRESSION: Patient is a 33 y.o. female  who was seen today for physical therapy treatment for pelvic pain that travels down bilateral posterior legs and is more prominent on the left side. She has attended 7 sessions of pelvic PT thus far and is feeling better each week, although she is still experiencing deep left sided pelvic floor tension that causes radiating pain down her left leg. Her pelvic pain has been much better overall, but she reports 5/10 discomfort today primarily in the left sacroiliac joint and down the left leg. Clamshell/reverse clamshell exercises added to HEP to start strengthening her hip rotators as this action seems to be the source of her left sided pelvic pain. No pain at end of session, patient reports feeling much  better than when she arrived today. Pt would benefit from additional PT to further address remaining pelvic pain and lack of coordination in the pelvic floor.  OBJECTIVE IMPAIRMENTS: decreased coordination, decreased endurance, decreased mobility, decreased ROM, decreased strength, and pain.   ACTIVITY LIMITATIONS: lifting  PARTICIPATION LIMITATIONS:  vaginal penetration   PERSONAL FACTORS: Past/current experiences and Time since onset of injury/illness/exacerbation are also affecting patient's functional outcome.   REHAB POTENTIAL: Good  CLINICAL DECISION MAKING: Stable/uncomplicated  EVALUATION COMPLEXITY: Low   GOALS: Goals reviewed with patient? Yes  SHORT TERM GOALS: Target date: 01/19/2024  Pt will be independent with HEP.  Baseline: Goal status: GOAL MET 01/20/24  2.   Pt will be independent with diaphragmatic breathing and down training activities in order to improve pelvic floor relaxation. Baseline:  Goal status: GOAL MET 01/20/24  3.  Pt will be able to correctly perform diaphragmatic breathing and appropriate pressure management in order to prevent worsening vaginal wall laxity and improve pelvic floor A/ROM.  Baseline:  Goal status: GOAL MET 01/20/24  LONG TERM GOALS: Target date: 06/20/2024  Pt will be independent with advanced HEP.  Baseline:  Goal status: GOAL MET 01/20/24  2.  Pt will demonstrate normal pelvic floor muscle tone and A/ROM, able to achieve 4/5 strength with contractions and 10 sec endurance, in order to provide appropriate lumbopelvic support in functional activities.  Baseline:  Goal status: ONGOING 01/19/14  3.  Pt to demonstrate improved coordination of pelvic floor and breathing mechanics with 10# squat with appropriate synergistic patterns to decrease pain at least 75% of the time.   Baseline:  Goal status: ONGOING 01/19/14  4.  Pt will report PFIQ-7 score of less than 24 points to suggest decreased functional limitations secondary to pelvic pain to improve quality of life.  Baseline:  Goal status: GOAL MET 01/20/24  5. Patient will report < or = to 2/10 pain in left side of pelvic floor and down left lower extremity to improve quality of life.   Baseline: 5/10  Goal status: ONGOING 02/10/24  PLAN:  PT FREQUENCY: 1x/week  PT DURATION: 8 weeks  PLANNED INTERVENTIONS: 97110-Therapeutic exercises, 97530- Therapeutic activity, 97112- Neuromuscular re-education, 97535- Self Care, 16109- Manual therapy, Taping, Dry Needling, Joint mobilization, Spinal mobilization, Scar mobilization, Cryotherapy, and Moist heat  PLAN FOR NEXT SESSION: continued internal treatment to decrease left levator spasms, continued downtraining techniques  Omar Person, PT 02/17/2024, 9:48 AM

## 2024-02-17 NOTE — Therapy (Signed)
 OUTPATIENT PHYSICAL THERAPY FEMALE PELVIC TREATMENT   Patient Name: Olivia Shepard MRN: [id number] DOB:10-09-1991, 33 y.o., female Today's Date: 02/17/2024  END OF SESSION:         Past Medical History:  Diagnosis Date   Anemia    Anxiety    GERD (gastroesophageal reflux disease)    History of postpartum hemorrhage, currently pregnant    Hypothyroidism    Ruptured disk    Past Surgical History:  Procedure Laterality Date   ESOPHAGEAL DILATION     Patient Active Problem List   Diagnosis Date Noted   Protrusion of lumbar intervertebral disc 11/25/2023   Dysphagia 11/14/2020   Pelvic floor dysfunction 10/02/2020   Varicose veins of legs, antepartum 12/19/2019   GERD (gastroesophageal reflux disease) 07/25/2016   Hypothyroidism 07/19/2015   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   IBS (irritable bowel syndrome) 06/29/2012    PCP: Westley Chandler, MD  REFERRING PROVIDER: [name], MD  REFERRING DIAG: [phone] (ICD-10-CM) - Levator spasm  THERAPY DIAG:  Muscle weakness (generalized)  Unspecified lack of coordination  Abnormal posture  Pelvic pain  Rationale for Evaluation and Treatment: Rehabilitation  ONSET DATE: 3 weeks ago, but has happened previously  SUBJECTIVE:                                                                                                                                                                                           SUBJECTIVE STATEMENT: Patient arrived 9 minutes late to PT session today.  She went to an appointment yesterday and she was told that she might not have endometriosis with her symptoms. She is getting a pelvic MRI in the future. Shooting pain is still present in left leg and behind the left knee. Left hip is still sore in the SI joint area. Urethral pain is better. Intercourse last week which was pleasant. Patient feels more coordination in the musculature in the pelvic floor.   PAIN:  02/10/24:  5/10 pain   02/03/24: 1/10 down the leg, back of the hip 3/10   01/28/24: 3-4/10  01/20/24: 3/10  01/13/24: 4-5/10   12/29/23: yes in pain, 4/10  Are you having pain? Yes NPRS scale: 4/10 Pain location: External, Deep, Bilateral, and Posterior  Pain type: aching Pain description: intermittent and sharp   Aggravating factors: as the day passes, pain gets worse  Relieving factors: nothing   PRECAUTIONS: None  RED FLAGS: None   WEIGHT BEARING RESTRICTIONS: No  FALLS:  Has patient fallen in last 6 months? 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  ACTIVITY LEVEL : walking and carrying children   PLOF: Independent  PATIENT GOALS: decrease pain   PERTINENT HISTORY:  5 vaginal deliveries, 3 adopted children Sexual abuse: No  BOWEL MOVEMENT: Pain with bowel movement: No Type of bowel movement:Type (Bristol Stool Scale) 4, Frequency 1x/day, Strain No, and Splinting no Fully empty rectum: Yes:   Leakage: No Pads: No Fiber supplement/laative Yes: miralax if not regular  URINATION: Pain with urination: Yes - some burning with urination that mimics pelvic muscle pain  Fully empty bladder: yes  Stream: Strong Frequency: wnl Leakage: no leakage unless urgency is super high and she cannot hold it   INTERCOURSE:  Ability to have vaginal penetration Yes.   Pain with intercourse: Initial Penetration, During Penetration, and Pain Interrupts Intercourse DrynessNo Climax: no issues  Marinoff Scale: 3/3  PREGNANCY: Vaginal deliveries 5 vaginal deliveries  Tearing Yes: with 1st 3 deliveries Currently pregnant No  PROLAPSE: Pressure to the left side of pelvic floor in the past   OBJECTIVE:  Note: Objective measures were completed at Evaluation unless otherwise noted.  PATIENT SURVEYS:  PFIQ-7: 24  COGNITION: Overall cognitive status: Within functional limits for tasks assessed     SENSATION: Light touch: Appears intact  LUMBAR  SPECIAL TESTS:  Single leg stance test: Positive  FUNCTIONAL TESTS:  Squat: bilateral dynamic knee valgus with loading   GAIT: Comments: mild trendelenburg gait pattern with ambulation   POSTURE: rounded shoulders and forward head   LUMBARAROM/PROM: WNL  A/PROM A/PROM  eval  Flexion   Extension   Right lateral flexion   Left lateral flexion   Right rotation   Left rotation    (Blank rows = not tested)  LOWER EXTREMITY ROM:  Active ROM Right eval Left eval  Hip flexion    Hip extension    Hip abduction    Hip adduction    Hip internal rotation    Hip external rotation    Knee flexion    Knee extension    Ankle dorsiflexion    Ankle plantarflexion    Ankle inversion    Ankle eversion     (Blank rows = not tested)  LOWER EXTREMITY MMT: 4-/5 grossly bilateral hips, bilateral knees 4/5   MMT Right eval Left eval  Hip flexion    Hip extension    Hip abduction    Hip adduction    Hip internal rotation    Hip external rotation    Knee flexion    Knee extension    Ankle dorsiflexion    Ankle plantarflexion    Ankle inversion    Ankle eversion     (Blank rows = not tested) PALPATION: General: no tenderness to palpation of adductors/hip flexors, some discomfort with pubic bone palpation  Pelvic Alignment: within normal limits   Abdominal: decreased rib mobility with inhalation                 External Perineal Exam: increased tenderness with palpation of left sided pelvic floor musculature                              Internal Pelvic Floor: left>right levator muscle tension, no trigger points noted, general overactivity found throughout entirety of pelvic floor musculature   Patient confirms identification and approves PT to assess internal pelvic floor and treatment Yes No emotional/communication barriers or cognitive limitation. Patient is motivated to learn. Patient understands and agrees with treatment goals and plan.  PT explains patient will be  examined in standing, sitting, and lying down to see how their muscles and joints work. When they are ready, they will be asked to remove their underwear so PT can examine their perineum. The patient is also given the option of providing their own chaperone as one is not provided in our facility. The patient also has the right and is explained the right to defer or refuse any part of the evaluation or treatment including the internal exam. With the patient's consent, PT will use one gloved finger to gently assess the muscles of the pelvic floor, seeing how well it contracts and relaxes and if there is muscle symmetry. After, the patient will get dressed and PT and patient will discuss exam findings and plan of care. PT and patient discuss plan of care, schedule, attendance policy and HEP activities.  PELVIC MMT:   MMT eval Reassessment day 02/10/24  Vaginal 3/5, generally increased muscle tone throughout 3+/5 strength manual muscle test, 5 second hold, 5 quick flick contractions   Internal Anal Sphincter    External Anal Sphincter    Puborectalis    Diastasis Recti    (Blank rows = not tested)        TONE: Increased muscle tension noted throughout pelvic floor musculature layers 1/2/3 bilateral   PROLAPSE: Anterior vaginal wall laxity with cough test   TODAY'S TREATMENT:                                                                                                                              DATE:   01/20/24: Manual therapy  Internal left sided levator ani muscle release and manual stretching with inhalation  Trigger Point Dry Needling Subsequent Treatment: Instructions provided previously at initial dry needling treatment.  Patient Verbal Consent Given: Yes Education Handout Provided: Previously Provided Muscles Treated: gluteus max, gluteus med, piriformis bilaterally  Electrical Stimulation Performed: No Treatment Response/Outcome: twitch response present in Left sided glute med; no  pain following treatment. Neuro re-ed: Hooklying diaphragmatic breathing with pelvic floor lengthening and shortening with inhalation/exhalation 3x10  Foam roll sit for pelvic floor lengthening and relaxation 2x40min  Happy baby + diaphragmatic breathing for pelvic floor relaxation 2x88min  01/28/24: Manual therapy  Internal left sided levator ani muscle release and manual stretching with inhalation  Trigger Point Dry Needling Subsequent Treatment: Instructions provided previously at initial dry needling treatment.  Patient Verbal Consent Given: Yes Education Handout Provided: Previously Provided Muscles Treated: gluteus max, gluteus med, piriformis bilaterally, lumbar multifidi, iliocostalis lumborum  Electrical Stimulation Performed: No Treatment Response/Outcome: twitch response present in Left sided glute med; no pain following treatment. Lumbar grade 1/2 mobilizations of spine to decrease nerve pain down left leg  Neuro re-ed: SEATED diaphragmatic breathing with pelvic floor lengthening and shortening with inhalation/exhalation 3x10  Seated diaphragmatic breathing + pelvic floor quick flick contractions + diaphragmatic breathing 2x10   02/03/24: Manual therapy  Internal left sided levator ani  muscle release and manual stretching with inhalation  Trigger Point Dry Needling Subsequent Treatment: Instructions provided previously at initial dry needling treatment.  Patient Verbal Consent Given: Yes Education Handout Provided: Previously Provided Muscles Treated: gluteus max, gluteus med, piriformis bilaterally Electrical Stimulation Performed: No Treatment Response/Outcome: twitch response present in Left sided glute med; no pain following treatment. Neuro re-ed: SEATED diaphragmatic breathing with pelvic floor lengthening and shortening with inhalation/exhalation 3x10  Seated diaphragmatic breathing + pelvic floor quick flick contractions + diaphragmatic breathing 2x10  Quadruped hip  hikes on yoga block + diaphragmatic breathing 2x10 each side (to mobilize SI joint) Lateral step ups on 6" step (left side only) + diaphragmatic breathing 2x10 each  Rocking hamstring/hip flexor stretch + diaphragmatic breathing 2x10 each  Self care  Intraabdominal pressure management during pelvic floor exercises and how to manage this to decrease pelvic pain    PATIENT EDUCATION:  Education details: pelvic floor muscle active range of motion, diaphragmatic breathing and the connection to the pelvic floor, vaginal moisturizers  Person educated: Patient Education method: Explanation, Demonstration, Tactile cues, Verbal cues, and Handouts Education comprehension: verbalized understanding, returned demonstration, verbal cues required, and tactile cues required  HOME EXERCISE PROGRAM: Access Code: [email or web address] URL: [email or web address] Date: 02/03/2024 Prepared by: Earna Coder  Exercises - Seated Pelvic Floor Contraction  - 1 x daily - 7 x weekly - 2 sets - 10 reps - Seated Quick Flick Pelvic Floor Contractions  - 1 x daily - 7 x weekly - 2 sets - 10 reps - Quadruped Hip Hike on Foam  - 1 x daily - 7 x weekly - 2 sets - 10 reps - Lateral Step Up  - 1 x daily - 7 x weekly - 2 sets - 10 reps - Half Kneeling ITB/TFL Stretch  - 1 x daily - 7 x weekly - 2 sets - 10 reps  ASSESSMENT:  CLINICAL IMPRESSION: Patient is a 33 y.o. female  who was seen today for physical therapy treatment for pelvic pain that travels down bilateral posterior legs and is more prominent on the left side. She has attended 6 sessions of pelvic PT thus far and is feeling better each week, in conjunction with levator injections. Her pelvic pain has been much better overall, but she reports 3/10 discomfort today primarily in the left sacroiliac joint and down the left leg. Dry needling performed to bilateral glutes and lumbar spine and patient reports significant improvement in tension following needling. Patient  provided exercises to mobilize left sacroiliac joint at home and she reports feeling better after trying these. No pain at end of session, patient reports feeling much better than when she arrived today. Pt would benefit from additional PT to further address remaining pelvic pain and lack of coordination in the pelvic floor.  OBJECTIVE IMPAIRMENTS: decreased coordination, decreased endurance, decreased mobility, decreased ROM, decreased strength, and pain.   ACTIVITY LIMITATIONS: lifting  PARTICIPATION LIMITATIONS:  vaginal penetration   PERSONAL FACTORS: Past/current experiences and Time since onset of injury/illness/exacerbation are also affecting patient's functional outcome.   REHAB POTENTIAL: Good  CLINICAL DECISION MAKING: Stable/uncomplicated  EVALUATION COMPLEXITY: Low   GOALS: Goals reviewed with patient? Yes  SHORT TERM GOALS: Target date: 01/19/2024  Pt will be independent with HEP.  Baseline: Goal status: GOAL MET 01/20/24  2.  Pt will be independent with diaphragmatic breathing and down training activities in order to improve pelvic floor relaxation. Baseline:  Goal status: GOAL MET 01/20/24  3.  Pt will be able to  correctly perform diaphragmatic breathing and appropriate pressure management in order to prevent worsening vaginal wall laxity and improve pelvic floor A/ROM.  Baseline:  Goal status: GOAL MET 01/20/24  LONG TERM GOALS: Target date: 06/20/2024  Pt will be independent with advanced HEP.  Baseline:  Goal status: GOAL MET 01/20/24  2.  Pt will demonstrate normal pelvic floor muscle tone and A/ROM, able to achieve 4/5 strength with contractions and 10 sec endurance, in order to provide appropriate lumbopelvic support in functional activities such as lifting her 8 children, transferring her children into and out of vehicle, and transferring children into and out of cribs. Baseline: 3+/5, 5 sec hold, 5 quick flicks  Goal status: ONGOING 02/10/24  3.  Pt to  demonstrate improved coordination of pelvic floor and breathing mechanics with 10# squat with appropriate synergistic patterns to decrease pain at least 75% of the time.   Baseline: pain with 5 repetitions in left side of pelvic floor at reassessment  Goal status: ONGOING 02/10/24  4.  Pt will report PFIQ-7 score of less than 24 points to suggest decreased functional limitations secondary to pelvic pain to improve quality of life.  Baseline:  Goal status: GOAL MET 01/20/24  5. Patient will report < or = to 2/10 pelvic pain at rest and with activity to improve quality of life and to allow patient to comfortably participate in sexual intercourse with partner.   Baseline: 5/10 at reassessment 02/10/24  Goal status: ONGOING 02/10/24  PLAN:  PT FREQUENCY: 1x/week  PT DURATION: 8 weeks  PLANNED INTERVENTIONS: 97110-Therapeutic exercises, 97530- Therapeutic activity, 97112- Neuromuscular re-education, 97535- Self Care, [id number]- Manual therapy, Taping, Dry Needling, Joint mobilization, Spinal mobilization, Scar mobilization, Cryotherapy, and Moist heat  PLAN FOR NEXT SESSION: continued internal treatment to decrease left levator spasms, continued downtraining techniques  Omar Person, PT 02/17/2024, 2:30 PM

## 2024-02-18 ENCOUNTER — Ambulatory Visit (HOSPITAL_COMMUNITY)

## 2024-02-18 ENCOUNTER — Ambulatory Visit: Admitting: Obstetrics and Gynecology

## 2024-02-18 ENCOUNTER — Encounter: Payer: Self-pay | Admitting: Obstetrics and Gynecology

## 2024-02-18 VITALS — BP 103/66 | HR 87

## 2024-02-18 DIAGNOSIS — M62838 Other muscle spasm: Secondary | ICD-10-CM | POA: Diagnosis not present

## 2024-02-18 MED ORDER — BUPIVACAINE HCL 0.25 % IJ SOLN
10.0000 mL | Freq: Once | INTRAMUSCULAR | Status: AC
  Administered 2024-02-18: 10 mL

## 2024-02-18 NOTE — Progress Notes (Signed)
  Urogynecology Return Visit  SUBJECTIVE  History of Present Illness: Olivia Shepard is a 33 y.o. female seen in follow-up for pelvic floor muscle spasm.   Reports numbness on the left has improved and pain has been improving with pelvic PT, feels more functional. However, she went to PT yesterday and was told that insurance did not approve more visits.   She was seen by Dr Olivia Shepard and pelvic MRI was ordered but she was also notified that the MRI was not approved, so it was cancelled.   She reports irregular periods. Has only had a period the last 4 months but has a normal period (heavy) then will have days of lighter bleeding between cycles.   She feels the trigger pont injections have been helping her pain.   Past Medical History: Patient  has a past medical history of Anemia, Anxiety, GERD (gastroesophageal reflux disease), History of postpartum hemorrhage, currently pregnant, Hypothyroidism, and Ruptured disk.   Past Surgical History: She  has a past surgical history that includes Esophageal dilation.   Medications: She has a current medication list which includes the following prescription(s): cyclobenzaprine, levothyroxine, meloxicam, prenatal multivitamin, sertraline, and triamcinolone ointment.   Allergies: Patient has no known allergies.   Social History: Patient  reports that she has never smoked. She has never used smokeless tobacco. She reports that she does not drink alcohol and does not use drugs.     OBJECTIVE     Physical Exam: Vitals:   02/18/24 0831  BP: 103/66  Pulse: 87   Gen: No apparent distress, A&O x 3.  Trigger point injection Indication(s): Levator spasm.   Informed Consent:  The alternatives, risks and benefits of the procedure were explained to the patient. Risks including, but not limited to discomfort, pain, bleeding, infection, injury to nearby structures, inability to perform the procedure, failure of the procedure were discussed.   All questions were answered and the patient elected to proceed.  Procedure:   The patient was positioned in dorsal lithotomy position.  The vaginal tissues were prepped with Hibiclens solution.  An injection of 10cc 0.25% Bupivacaine was performed in multiple in the bilateral levator muscles and OI- one on the right and 3 on the left.  Pressure was held over bleeding areas until good hemostasis was achieved.   The patient tolerated the procedure well with no apparent complications.     ASSESSMENT AND PLAN    Ms. Olivia Shepard is a 33 y.o. with:  1. Levator spasm     - Will plan for trigger point injections with pain flares- patient to call the office  - Encouraged her to call the insurance company to appeal the PT decision.  - Will message Dr Olivia Shepard regarding her irregular periods and re: MRI.   Olivia Beards, MD  Time spent: I spent 15 minutes dedicated to the care of this patient on the date of this encounter to include pre-visit review of records, face-to-face time with the patient and post visit documentation in addition to the procedure.

## 2024-02-19 ENCOUNTER — Telehealth: Payer: Self-pay | Admitting: Obstetrics and Gynecology

## 2024-02-19 ENCOUNTER — Encounter: Payer: Self-pay | Admitting: Obstetrics and Gynecology

## 2024-02-19 DIAGNOSIS — N939 Abnormal uterine and vaginal bleeding, unspecified: Secondary | ICD-10-CM

## 2024-02-19 DIAGNOSIS — N809 Endometriosis, unspecified: Secondary | ICD-10-CM

## 2024-02-19 DIAGNOSIS — G8929 Other chronic pain: Secondary | ICD-10-CM

## 2024-02-19 NOTE — Telephone Encounter (Signed)
 This Pt's MRI PA was DENIED due to No Medically Necessity. However, the [employer] Rep. Thelma P stated that this case is eligible for reconsideration before 02/22/2024.   The callback number: [phone]  The Ref. #[id number]   Called [employer] medical benefits 02/19/24 at 1:03 PM Dr. Noelle Penner. Stated that MRI would not be approved without first having an ultrasound on file and was non-diagnostic   Pelvic ultrasound ordered

## 2024-02-28 ENCOUNTER — Other Ambulatory Visit: Payer: Self-pay | Admitting: Medical Genetics

## 2024-03-10 ENCOUNTER — Encounter: Payer: Self-pay | Admitting: Obstetrics and Gynecology

## 2024-03-10 ENCOUNTER — Ambulatory Visit (HOSPITAL_BASED_OUTPATIENT_CLINIC_OR_DEPARTMENT_OTHER)
Admission: RE | Admit: 2024-03-10 | Discharge: 2024-03-10 | Disposition: A | Discharge status: Home / Self Care | Source: Ambulatory Visit | Attending: Obstetrics and Gynecology | Admitting: Obstetrics and Gynecology

## 2024-03-10 ENCOUNTER — Ambulatory Visit: Attending: Obstetrics and Gynecology | Admitting: Physical Therapy

## 2024-03-10 DIAGNOSIS — N939 Abnormal uterine and vaginal bleeding, unspecified: Secondary | ICD-10-CM | POA: Diagnosis not present

## 2024-03-10 DIAGNOSIS — G8929 Other chronic pain: Secondary | ICD-10-CM | POA: Insufficient documentation

## 2024-03-10 DIAGNOSIS — N809 Endometriosis, unspecified: Secondary | ICD-10-CM | POA: Diagnosis not present

## 2024-03-10 DIAGNOSIS — R279 Unspecified lack of coordination: Secondary | ICD-10-CM | POA: Diagnosis not present

## 2024-03-10 DIAGNOSIS — R252 Cramp and spasm: Secondary | ICD-10-CM | POA: Insufficient documentation

## 2024-03-10 DIAGNOSIS — R102 Pelvic and perineal pain: Secondary | ICD-10-CM | POA: Diagnosis not present

## 2024-03-10 DIAGNOSIS — M6281 Muscle weakness (generalized): Secondary | ICD-10-CM | POA: Diagnosis not present

## 2024-03-10 NOTE — Therapy (Signed)
 OUTPATIENT PHYSICAL THERAPY FEMALE PELVIC TREATMENT/REASSESSMENT   Patient Name: Olivia Shepard MRN: [id number] DOB:12-02-1990, 33 y.o., female Today's Date: 03/10/2024  END OF SESSION:  PT End of Session - 03/10/24 0845     Visit Number 9    Number of Visits 12    Date for PT Re-Evaluation 08/05/24    Authorization Type Medicaid    Authorization Time Period [employer] approved 4 visits 02/17/2024 - 03/17/2024 auth#[id number] - COUNT START 03/10/24    Authorization - Visit Number 1    Authorization - Number of Visits 4    PT Start Time 0811   arrival time   PT Stop Time 0845    PT Time Calculation (min) 34 min    Activity Tolerance Patient tolerated treatment well    Behavior During Therapy [facility] for tasks assessed/performed                    Past Medical History:  Diagnosis Date   Anemia    Anxiety    GERD (gastroesophageal reflux disease)    History of postpartum hemorrhage, currently pregnant    Hypothyroidism    Ruptured disk    Past Surgical History:  Procedure Laterality Date   ESOPHAGEAL DILATION     Patient Active Problem List   Diagnosis Date Noted   Protrusion of lumbar intervertebral disc 11/25/2023   Dysphagia 11/14/2020   Pelvic floor dysfunction 10/02/2020   Varicose veins of legs, antepartum 12/19/2019   GERD (gastroesophageal reflux disease) 07/25/2016   Hypothyroidism 07/19/2015   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   IBS (irritable bowel syndrome) 06/29/2012    PCP: Azell Boll, MD  REFERRING PROVIDER: Arma Lamp, MD  REFERRING DIAG: [id number] (ICD-10-CM) - Levator spasm  THERAPY DIAG:  Muscle weakness (generalized)  Cramp and spasm  Unspecified lack of coordination  Rationale for Evaluation and Treatment: Rehabilitation  ONSET DATE: 3 weeks ago, but has happened previously  SUBJECTIVE:                                                                                                                                                                                            SUBJECTIVE STATEMENT: Pt reports she feels some pain in her shoulder and opp hip but overall improving. Tightness in back has not resolved with her attempts at stretching and heat  From last session approved: Patient had a period two weeks ago, but she started bleeding again yesterday, unsure why. She is experiencing a light to medium flow. She has an MRI for her pelvis on the 10th of April. She is still experiencing pain down  the left leg posteriorly.  Left hip has been feeling better. Intercourse has been pleasant recently. Exercises seem to be helping pain when period is not present, but when period is present, she feels a big pressure sensation right on her left sits bone that causes radiating pain down the left leg posteriorly.   PAIN:  5/1 Are you having pain? Yes NPRS scale: 4/10 - back, upper right; 2-10 Lt hip  Pain location: External, Deep, Bilateral, and Posterior  Pain type: aching Pain description: intermittent and sharp   Aggravating factors: as the day passes, pain gets worse  Relieving factors: nothing   PRECAUTIONS: None  RED FLAGS: None   WEIGHT BEARING RESTRICTIONS: No  FALLS:  Has patient fallen in last 6 months? 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   ACTIVITY LEVEL : walking and carrying children   PLOF: Independent  PATIENT GOALS: decrease pain   PERTINENT HISTORY:  5 vaginal deliveries, 3 adopted children Sexual abuse: No  BOWEL MOVEMENT: Pain with bowel movement: No Type of bowel movement:Type (Bristol Stool Scale) 4, Frequency 1x/day, Strain No, and Splinting no Fully empty rectum: Yes:   Leakage: No Pads: No Fiber supplement/laative Yes: miralax  if not regular  URINATION: Pain with urination: Yes - some burning with urination that mimics pelvic muscle pain  Fully empty bladder: yes  Stream: Strong Frequency: wnl Leakage: no leakage unless  urgency is super high and she cannot hold it   INTERCOURSE:  Ability to have vaginal penetration Yes.   Pain with intercourse: Initial Penetration, During Penetration, and Pain Interrupts Intercourse DrynessNo Climax: no issues  Marinoff Scale: 3/3  PREGNANCY: Vaginal deliveries 5 vaginal deliveries  Tearing Yes: with 1st 3 deliveries Currently pregnant No  PROLAPSE: Pressure to the left side of pelvic floor in the past   OBJECTIVE:  Note: Objective measures were completed at Evaluation unless otherwise noted.  PATIENT SURVEYS:  PFIQ-7: 24 POPIQ-7: 43 - in relation to the pain down the leg   COGNITION: Overall cognitive status: Within functional limits for tasks assessed     SENSATION: Light touch: Appears intact  LUMBAR SPECIAL TESTS:  Single leg stance test: Positive  FUNCTIONAL TESTS:  Squat: bilateral dynamic knee valgus with loading   GAIT: Comments: mild trendelenburg gait pattern with ambulation   POSTURE: rounded shoulders and forward head   LUMBARAROM/PROM: WNL  A/PROM A/PROM  eval  Flexion   Extension   Right lateral flexion   Left lateral flexion   Right rotation   Left rotation    (Blank rows = not tested)  LOWER EXTREMITY ROM:  Active ROM Right eval Left eval  Hip flexion    Hip extension    Hip abduction    Hip adduction    Hip internal rotation    Hip external rotation    Knee flexion    Knee extension    Ankle dorsiflexion    Ankle plantarflexion    Ankle inversion    Ankle eversion     (Blank rows = not tested)  LOWER EXTREMITY MMT: 4-/5 grossly bilateral hips, bilateral knees 4/5   MMT Right eval Left eval  Hip flexion    Hip extension    Hip abduction    Hip adduction    Hip internal rotation    Hip external rotation    Knee flexion    Knee extension    Ankle dorsiflexion    Ankle plantarflexion  Ankle inversion    Ankle eversion     (Blank rows = not tested) PALPATION: General: no tenderness to palpation  of adductors/hip flexors, some discomfort with pubic bone palpation  Pelvic Alignment: within normal limits   Abdominal: decreased rib mobility with inhalation                 External Perineal Exam: increased tenderness with palpation of left sided pelvic floor musculature                              Internal Pelvic Floor: left>right levator muscle tension, no trigger points noted, general overactivity found throughout entirety of pelvic floor musculature   Patient confirms identification and approves PT to assess internal pelvic floor and treatment Yes No emotional/communication barriers or cognitive limitation. Patient is motivated to learn. Patient understands and agrees with treatment goals and plan. PT explains patient will be examined in standing, sitting, and lying down to see how their muscles and joints work. When they are ready, they will be asked to remove their underwear so PT can examine their perineum. The patient is also given the option of providing their own chaperone as one is not provided in our facility. The patient also has the right and is explained the right to defer or refuse any part of the evaluation or treatment including the internal exam. With the patient's consent, PT will use one gloved finger to gently assess the muscles of the pelvic floor, seeing how well it contracts and relaxes and if there is muscle symmetry. After, the patient will get dressed and PT and patient will discuss exam findings and plan of care. PT and patient discuss plan of care, schedule, attendance policy and HEP activities.  PELVIC MMT:   MMT eval Reassassment day 02/10/24  Vaginal 3/5, generally increased muscle tone throughout   Internal Anal Sphincter    External Anal Sphincter    Puborectalis    Diastasis Recti    (Blank rows = not tested)        TONE: Increased muscle tension noted throughout pelvic floor musculature layers 1/2/3 bilateral   PROLAPSE: Anterior vaginal wall laxity  with cough test   TODAY'S TREATMENT:                                                                                                                              DATE:   01/28/24: Manual therapy  Internal left sided levator ani muscle release and manual stretching with inhalation  Trigger Point Dry Needling Subsequent Treatment: Instructions provided previously at initial dry needling treatment.  Patient Verbal Consent Given: Yes Education Handout Provided: Previously Provided Muscles Treated: gluteus max, gluteus med, piriformis bilaterally, lumbar multifidi, iliocostalis lumborum  Electrical Stimulation Performed: No Treatment Response/Outcome: twitch response present in Left sided glute med; no pain following treatment. Lumbar grade 1/2 mobilizations of spine to decrease nerve pain down left  leg  Neuro re-ed: SEATED diaphragmatic breathing with pelvic floor lengthening and shortening with inhalation/exhalation 3x10  Seated diaphragmatic breathing + pelvic floor quick flick contractions + diaphragmatic breathing 2x10   02/03/24: Manual therapy  Internal left sided levator ani muscle release and manual stretching with inhalation  Trigger Point Dry Needling Subsequent Treatment: Instructions provided previously at initial dry needling treatment.  Patient Verbal Consent Given: Yes Education Handout Provided: Previously Provided Muscles Treated: gluteus max, gluteus med, piriformis bilaterally Electrical Stimulation Performed: No Treatment Response/Outcome: twitch response present in Left sided glute med; no pain following treatment. Neuro re-ed: SEATED diaphragmatic breathing with pelvic floor lengthening and shortening with inhalation/exhalation 3x10  Seated diaphragmatic breathing + pelvic floor quick flick contractions + diaphragmatic breathing 2x10  Quadruped hip hikes on yoga block + diaphragmatic breathing 2x10 each side (to mobilize SI joint) Lateral step ups on 6" step (left  side only) + diaphragmatic breathing 2x10 each  Rocking hamstring/hip flexor stretch + diaphragmatic breathing 2x10 each  Self care  Intraabdominal pressure management during pelvic floor exercises and how to manage this to decrease pelvic pain  02/10/24: reassessment day  Manual therapy  Internal left sided levator ani muscle release and manual stretching with inhalation  Trigger Point Dry Needling Subsequent Treatment: Instructions provided previously at initial dry needling treatment.  Patient Verbal Consent Given: Yes Education Handout Provided: Previously Provided Muscles Treated: left gluteus medius, gluteus maximus  Electrical Stimulation Performed: No Treatment Response/Outcome: twitch response present in Left sided glute med; no pain following treatment. Neuro re-ed: SEATED diaphragmatic breathing with pelvic floor lengthening and shortening with inhalation/exhalation 3x10  Seated diaphragmatic breathing + pelvic floor quick flick contractions + diaphragmatic breathing 2x10  Quadruped hip hikes on yoga block + diaphragmatic breathing 2x10 each side (to mobilize SI joint) Lateral step ups on 6" step (left side only) + diaphragmatic breathing 2x10 each  Rocking hamstring/hip flexor stretch + diaphragmatic breathing 2x10 each  Sidelying clamshell + reverse clamshell + diaphragmatic breathing 2x10 each  Self care  Intraabdominal pressure management during pelvic floor exercises and how to manage this to decrease pelvic pain  03/10/24: Vibration plate 1 min standing, 1 min sitting low setting Foam roller vertical for trunk extension angel wings x10, wide claps x10 Lumbar roll outs x10 rt/lt/center Adductor rocks x10 each + opp table top Cat/cow with childs pose (hip IR/neural every other) x10 Pigeon with needle thread 2x30s Manual - rib mobilization in supine with 260 breathing x10, STM at Rt rhomboid and Lat   PATIENT EDUCATION:  Education details: pelvic floor muscle active range  of motion, diaphragmatic breathing and the connection to the pelvic floor, vaginal moisturizers  Person educated: Patient Education method: Explanation, Demonstration, Tactile cues, Verbal cues, and Handouts Education comprehension: verbalized understanding, returned demonstration, verbal cues required, and tactile cues required  HOME EXERCISE PROGRAM: Access Code: [email or web address] URL: [email or web address] Date: 02/10/2024 Prepared by: Robbin Chill  Exercises - Seated Pelvic Floor Contraction  - 1 x daily - 7 x weekly - 2 sets - 10 reps - Seated Quick Flick Pelvic Floor Contractions  - 1 x daily - 7 x weekly - 2 sets - 10 reps - Quadruped Hip Hike on Foam  - 1 x daily - 7 x weekly - 2 sets - 10 reps - Lateral Step Up  - 1 x daily - 7 x weekly - 2 sets - 10 reps - Half Kneeling ITB/TFL Stretch  - 1 x daily - 7 x weekly - 2 sets - 10  reps - Clamshell  - 1 x daily - 7 x weekly - 2 sets - 10 reps - Sidelying Reverse Clamshell  - 1 x daily - 7 x weekly - 2 sets - 10 reps  ASSESSMENT:  CLINICAL IMPRESSION: Patient is a 33 y.o. female  who was seen today for physical therapy treatment for pelvic pain that travels down bilateral posterior legs and is more prominent on the left side. Pt's pain does continue but reported no pain in hip at end of session and decreased back pain to 2-3/10. Pt tolerated session well and emphasized Lt hip stretching and Rt trunk rotation. Pt would benefit from additional PT to further address remaining pelvic pain and lack of coordination in the pelvic floor.  OBJECTIVE IMPAIRMENTS: decreased coordination, decreased endurance, decreased mobility, decreased ROM, decreased strength, and pain.   ACTIVITY LIMITATIONS: lifting  PARTICIPATION LIMITATIONS:  vaginal penetration   PERSONAL FACTORS: Past/current experiences and Time since onset of injury/illness/exacerbation are also affecting patient's functional outcome.   REHAB POTENTIAL: Good  CLINICAL DECISION  MAKING: Stable/uncomplicated  EVALUATION COMPLEXITY: Low   GOALS: Goals reviewed with patient? Yes  SHORT TERM GOALS: Target date: 01/19/2024  Pt will be independent with HEP.  Baseline: Goal status: GOAL MET 01/20/24  2.  Pt will be independent with diaphragmatic breathing and down training activities in order to improve pelvic floor relaxation. Baseline:  Goal status: GOAL MET 01/20/24  3.  Pt will be able to correctly perform diaphragmatic breathing and appropriate pressure management in order to prevent worsening vaginal wall laxity and improve pelvic floor A/ROM.  Baseline:  Goal status: GOAL MET 01/20/24  LONG TERM GOALS: Target date: 06/20/2024  Pt will be independent with advanced HEP.  Baseline:  Goal status: GOAL MET 01/20/24  2.  Pt will demonstrate normal pelvic floor muscle tone and A/ROM, able to achieve 4/5 strength with contractions and 10 sec endurance, in order to provide appropriate lumbopelvic support in functional activities.  Baseline:  Goal status: ONGOING 01/19/14  3.  Pt to demonstrate improved coordination of pelvic floor and breathing mechanics with 10# squat with appropriate synergistic patterns to decrease pain at least 75% of the time.   Baseline:  Goal status: ONGOING 01/19/14  4.  Pt will report PFIQ-7 score of less than 24 points to suggest decreased functional limitations secondary to pelvic pain to improve quality of life.  Baseline:  Goal status: GOAL MET 01/20/24  5. Patient will report < or = to 2/10 pain in left side of pelvic floor and down left lower extremity to improve quality of life.   Baseline: 5/10  Goal status: ONGOING 02/10/24  PLAN:  PT FREQUENCY: 1x/week  PT DURATION: 8 weeks  PLANNED INTERVENTIONS: 97110-Therapeutic exercises, 97530- Therapeutic activity, 97112- Neuromuscular re-education, 97535- Self Care, 08657- Manual therapy, Taping, Dry Needling, Joint mobilization, Spinal mobilization, Scar mobilization, Cryotherapy, and  Moist heat  PLAN FOR NEXT SESSION: continued internal treatment to decrease left levator spasms, continued downtraining techniques Avie Lemme, PT, DPT 05/01/259:00 AM

## 2024-03-12 ENCOUNTER — Other Ambulatory Visit: Payer: Self-pay | Attending: Medical Genetics

## 2024-03-15 ENCOUNTER — Telehealth: Payer: Self-pay

## 2024-03-15 ENCOUNTER — Other Ambulatory Visit: Payer: Self-pay | Admitting: Obstetrics and Gynecology

## 2024-03-15 DIAGNOSIS — M62838 Other muscle spasm: Secondary | ICD-10-CM

## 2024-03-15 NOTE — Telephone Encounter (Signed)
 Patient Olivia Shepard on nurse line reporting intermittent dizziness.   She reports symptoms have been present off and on for ~2 weeks. She reports she thought she was dehydrated so she increased her water intake, however no improvement.   She reports she did have a "heavy" menstrual period recently.   I attempted to call patient back to gather more information and schedule an apt.   However, no answer. VM left to return my call.

## 2024-03-15 NOTE — Telephone Encounter (Signed)
 Noted and agree.

## 2024-03-15 NOTE — Telephone Encounter (Signed)
 Patient returns call to nurse line.   She reports she had a heavy menstrual ~ 3 weeks ago. She reports she takes a daily multi vitamin with iron.   She reports over the last several weeks she has felt an overwhelming dizziness. She reports it goes away after a few minutes, however she has to put her head down or in between her legs. She reports coworkers have noted she turns pale during episodes.   She denies any syncope episodes, nausea, vomiting, vision changes, shortness of breath or chest pains.   Patient advised evaluation as soon as possible. She reports she will not be able to come in until 5/9. She reports she is going out of town tomorrow-Thursday.   Strict ED precautions discussed with patient.   Patient scheduled for 5/9 with [place].

## 2024-03-17 ENCOUNTER — Ambulatory Visit: Admitting: Physical Therapy

## 2024-03-18 ENCOUNTER — Ambulatory Visit (INDEPENDENT_AMBULATORY_CARE_PROVIDER_SITE_OTHER): Admitting: Family Medicine

## 2024-03-18 ENCOUNTER — Encounter: Payer: Self-pay | Admitting: Family Medicine

## 2024-03-18 ENCOUNTER — Ambulatory Visit: Admitting: Family Medicine

## 2024-03-18 VITALS — BP 117/79 | HR 71 | Ht 67.0 in | Wt 146.0 lb

## 2024-03-18 DIAGNOSIS — E039 Hypothyroidism, unspecified: Secondary | ICD-10-CM | POA: Diagnosis not present

## 2024-03-18 DIAGNOSIS — N920 Excessive and frequent menstruation with regular cycle: Secondary | ICD-10-CM | POA: Diagnosis not present

## 2024-03-18 DIAGNOSIS — R42 Dizziness and giddiness: Secondary | ICD-10-CM | POA: Diagnosis not present

## 2024-03-18 LAB — POCT HEMOGLOBIN: Hemoglobin: 11.6 g/dL (ref 11–14.6)

## 2024-03-18 NOTE — Patient Instructions (Signed)
 It was great to see you today! Thank you for choosing [facility] for your primary care. Olivia Shepard was seen for dizziness.  Today we addressed: Your dizziness. I think this is likely from BPPV, or Benign Paroxysmal Positional Vertigo. I have attached a maneuver for you to try. Try this over the weekend a few times. If your symptoms do not improve, please call us  for a follow up   If you haven't already, sign up for My Chart to have easy access to your labs results, and communication with your primary care physician.  We are checking some labs today. If they are abnormal, I will call you. If they are normal, I will send you a MyChart message (if it is active) or a letter in the mail. If you do not hear about your labs in the next 2 weeks, please call the office.   You should return to our clinic as needed  I recommend that you always bring your medications to each appointment as this makes it easy to ensure you are on the correct medications and helps us  not miss refills when you need them.  Please arrive 15 minutes before your appointment to ensure smooth check in process.  We appreciate your efforts in making this happen.  Please call the clinic at [phone] if your symptoms worsen or you have any concerns.  Thank you for allowing me to participate in your care, Johnella Naas, MD 03/18/2024, 4:29 PM PGY-1, [facility]

## 2024-03-18 NOTE — Progress Notes (Signed)
    SUBJECTIVE:   CHIEF COMPLAINT / HPI:   Dizziness for the past few weeks. Is having abnormal periods with some break through bleeding and her period was heavy.   The dizziness started two weeks ago, up to 5 times a day. Comes and goes, lasts about 10 minutes. Helps if she puts her head between her legs.   Feels like vertigo. No nausea. Does have not vision spots or ringing in ears.   Used to have vertigo migraines x10 years ago. This feels similar but does not have any other associated migraine symptoms.   PERTINENT  PMH / PSH: Hypothyroidism    OBJECTIVE:   BP 117/79   Pulse 71   Ht 5\' 7"  (1.702 m)   Wt 146 lb (66.2 kg)   SpO2 97%   BMI 22.87 kg/m   General: A&O, NAD HEENT: No sign of trauma, EOM grossly intact Cardiac: RRR, no m/r/g Respiratory: CTAB, normal WOB, no w/c/r GI: Soft, NTTP, non-distended  Extremities: NTTP, no peripheral edema. Neuro: No focal neurological deficit noted  Psych: Appropriate mood and affect   ASSESSMENT/PLAN:   Assessment & Plan Menorrhagia with regular cycle Considered new changes in period, ordered CBC to rule out acute bleed. POCT Hg 11.6, appears to be around baseline of 12-13. Discussed patient should return for follow up if continues to have abnormal cycles.  Hypothyroidism, unspecified type Will reorder TSH given new dizziness symptoms.  Dizziness Positive Dix-Hallpike on left side with torsional nystagmus. Concern for BPPV. Epley maneuver done in clinic today and advised patient to trial this over the weekend. Some concern for recurrence of vestibular migraine, but will have patient follow up if not improved over the weekend with Epley maneuver. Reassuringly benign neuro exam otherwise, less concerned for acute neurological process.   Johnella Naas, MD [facility]

## 2024-03-18 NOTE — Assessment & Plan Note (Signed)
 Will reorder TSH given new dizziness symptoms.

## 2024-03-19 LAB — TSH RFX ON ABNORMAL TO FREE T4: TSH: 1.16 u[IU]/mL (ref 0.450–4.500)

## 2024-03-21 ENCOUNTER — Encounter: Payer: Self-pay | Admitting: Family Medicine

## 2024-03-23 ENCOUNTER — Ambulatory Visit: Admitting: Physical Therapy

## 2024-03-30 ENCOUNTER — Ambulatory Visit: Admitting: Physical Therapy

## 2024-04-06 ENCOUNTER — Ambulatory Visit: Admitting: Physical Therapy

## 2024-04-13 ENCOUNTER — Encounter: Admitting: Physical Therapy

## 2024-04-13 NOTE — Progress Notes (Signed)
 GYNECOLOGY VISIT  Patient name: Olivia Shepard MRN [id number]  Date of birth: [birth year] Chief Complaint:   Follow-up  History:  Olivia Shepard is a 33 y.o. [id number] being seen today for dysmenorrhea.  Lasy ccyle in may, 3 days wih no pain. Went to PT and it ws denied but something has happened with insurance  Discussed the use of AI scribe software for clinical note transcription with the patient, who gave verbal consent to proceed.  History of Present Illness Olivia Shepard is a 33 year old female who presents with menstrual irregularities and associated pain.  She describes her menstrual pain as typically severe, but her last cycle was notably less painful without the use of Mobic , which she had previously used as a one-time prescription. Her bleeding pattern has been irregular, with her last menstrual period occurring three weeks ago and lasting three days, shorter than usual. No intermenstrual bleeding has been noted recently.  She has a history of back and leg pain, described as nerve pain down her leg, which improves with exercise and stretching but worsens when these activities are stopped. She has consulted with an orthopedic specialist in the past but has not seen a neurologist or had a follow-up with the back specialist since the last study.  She is currently taking Zoloft , which she is trying to wean off by taking half the dose. She also uses Flexeril , which was prescribed with two refills, and Mobic , which she found helpful for menstrual pain but currently has no refills. She is concerned about using hormonal medications due to past experiences with emotional changes and suicidal thoughts.  Her social history includes managing stress related to her family, particularly with her two oldest adopted children who are often in crisis. This adds to her stress and impacts her ability to manage her health conditions. She has stopped breastfeeding since Mother's Day.     Past Medical  History:  Diagnosis Date   Anemia    Anxiety    GERD (gastroesophageal reflux disease)    History of postpartum hemorrhage, currently pregnant    Hypothyroidism    Ruptured disk     Past Surgical History:  Procedure Laterality Date   ESOPHAGEAL DILATION      The following portions of the patient's history were reviewed and updated as appropriate: allergies, current medications, past family history, past medical history, past social history, past surgical history and problem list.   Health Maintenance:   Last pap     Component Value Date/Time   DIAGPAP  12/07/2023 1134    - Negative for intraepithelial lesion or malignancy (NILM)   DIAGPAP (A) 09/27/2021 1157    - Atypical squamous cells of undetermined significance (ASC-US )   HPVHIGH Negative 12/07/2023 1134   HPVHIGH Negative 09/27/2021 1157   ADEQPAP  12/07/2023 1134    Satisfactory for evaluation; transformation zone component PRESENT.   ADEQPAP  09/27/2021 1157    Satisfactory for evaluation; transformation zone component PRESENT.     Last mammogram: n/a   Review of Systems:  Pertinent items are noted in HPI. Comprehensive review of systems was otherwise negative.   Objective:  Physical Exam BP 103/71   Pulse 71   Wt 141 lb (64 kg)   LMP 03/24/2024 (Exact Date)   BMI 22.08 kg/m    Physical Exam Vitals and nursing note reviewed.  Constitutional:      Appearance: Normal appearance.  HENT:     Head: Normocephalic and atraumatic.  Pulmonary:  Effort: Pulmonary effort is normal.   Skin:    General: Skin is warm and dry.   Neurological:     General: No focal deficit present.     Mental Status: She is alert.   Psychiatric:        Mood and Affect: Mood normal.        Behavior: Behavior normal.        Thought Content: Thought content normal.        Judgment: Judgment normal.        Assessment & Plan:   Assessment & Plan Dysmenorrhea Menstrual pain improved, Mobic  effective previously.  Ultrasound normal. Consider MRI or diagnostic laparoscopy for endometriosis. Concern about emotional changes with hormonal treatments. Nonhormonal options like Lysteda  considered for heavy bleeding. - Prescribe Mobic  for menstrual pain. - Consider MRI for dysmenorrhea evaluation, pending insurance approval. - Discuss diagnostic laparoscopy if MRI not feasible. - Consider Lysteda  for heavy menstrual bleeding. - Use Flexeril  for pain as needed.  Abnormal uterine bleeding No intermenstrual bleeding since cessation of breastfeeding. Last cycle normal. Endometrial biopsy considered if abnormal bleeding returns. - Monitor menstrual bleeding pattern. - Consider endometrial biopsy if abnormal bleeding returns.  Posterior leg nerve pain Nerve pain improves with exercise, worsens without. Cause unclear, MRI considered. No neurologist consultation yet. - Reschedule MRI for posterior leg nerve pain investigation. - Consider neurologist referral if MRI inconclusive.   Routine preventative health maintenance measures emphasized.  Kiki Pelton, MD [facility] for [facility], [facility]

## 2024-04-14 ENCOUNTER — Other Ambulatory Visit: Payer: Self-pay

## 2024-04-14 ENCOUNTER — Ambulatory Visit: Admitting: Obstetrics and Gynecology

## 2024-04-14 VITALS — BP 103/71 | HR 71 | Wt 141.0 lb

## 2024-04-14 DIAGNOSIS — M6289 Other specified disorders of muscle: Secondary | ICD-10-CM

## 2024-04-14 DIAGNOSIS — N939 Abnormal uterine and vaginal bleeding, unspecified: Secondary | ICD-10-CM | POA: Diagnosis not present

## 2024-04-14 MED ORDER — TRANEXAMIC ACID 650 MG PO TABS: 1300.0000 mg | ORAL_TABLET | Freq: Three times a day (TID) | ORAL | 2 refills | Status: DC

## 2024-04-14 MED ORDER — MELOXICAM 7.5 MG PO TABS: 7.5000 mg | ORAL_TABLET | Freq: Every day | ORAL | 6 refills | Status: DC

## 2024-04-20 ENCOUNTER — Encounter: Admitting: Physical Therapy

## 2024-04-21 ENCOUNTER — Ambulatory Visit (HOSPITAL_COMMUNITY): Admission: RE | Admit: 2024-04-21 | Source: Ambulatory Visit

## 2024-04-27 ENCOUNTER — Ambulatory Visit (HOSPITAL_COMMUNITY)
Admission: RE | Admit: 2024-04-27 | Discharge: 2024-04-27 | Disposition: A | Discharge status: Home / Self Care | Source: Ambulatory Visit | Attending: Obstetrics and Gynecology | Admitting: Obstetrics and Gynecology

## 2024-04-27 DIAGNOSIS — R102 Pelvic and perineal pain: Secondary | ICD-10-CM | POA: Insufficient documentation

## 2024-04-27 DIAGNOSIS — G8929 Other chronic pain: Secondary | ICD-10-CM | POA: Insufficient documentation

## 2024-04-27 DIAGNOSIS — N809 Endometriosis, unspecified: Secondary | ICD-10-CM | POA: Insufficient documentation

## 2024-04-27 MED ORDER — GADOBUTROL 1 MMOL/ML IV SOLN
6.0000 mL | Freq: Once | INTRAVENOUS | Status: AC | PRN
  Administered 2024-04-27: 6 mL via INTRAVENOUS

## 2024-05-03 ENCOUNTER — Ambulatory Visit: Payer: Self-pay | Admitting: Obstetrics and Gynecology

## 2024-05-08 DIAGNOSIS — R102 Pelvic and perineal pain: Secondary | ICD-10-CM | POA: Diagnosis not present

## 2024-05-08 DIAGNOSIS — R300 Dysuria: Secondary | ICD-10-CM | POA: Diagnosis not present

## 2024-05-08 DIAGNOSIS — R110 Nausea: Secondary | ICD-10-CM | POA: Diagnosis not present

## 2024-05-12 ENCOUNTER — Telehealth: Payer: Self-pay

## 2024-05-12 NOTE — Telephone Encounter (Signed)
 Agree, would recommend UC when she calls back

## 2024-05-12 NOTE — Telephone Encounter (Signed)
 Patient calls back.   She reports she feels she may have a fever. However, she is not sure. She reports feeling chills.   Patient advised to go to UC.  She agreed with plan.

## 2024-05-12 NOTE — Telephone Encounter (Signed)
 Patient LVM on nurse line in regards to UTI.   She reports UTI sxs for ~ 1 week. She reports she went to an UC and was given an antibiotic. She reports she was unable to start the medication until today.  She reports severe left sided flank pain. She reported taking Tylenol  with no relief.   I attempted to call patient back to gather more information, however she did not answer.   I left a VM asking her to call me back if she could before 5, however ultimately suggested another UC visit.   Will forward to PCP.

## 2024-05-15 DIAGNOSIS — R300 Dysuria: Secondary | ICD-10-CM | POA: Diagnosis not present

## 2024-05-15 DIAGNOSIS — R350 Frequency of micturition: Secondary | ICD-10-CM | POA: Diagnosis not present

## 2024-05-16 DIAGNOSIS — E039 Hypothyroidism, unspecified: Secondary | ICD-10-CM | POA: Diagnosis not present

## 2024-05-17 LAB — TSH: TSH: 2.38 u[IU]/mL (ref 0.450–4.500)

## 2024-05-17 LAB — T4, FREE: Free T4: 1.24 ng/dL (ref 0.82–1.77)

## 2024-05-18 ENCOUNTER — Encounter: Payer: Self-pay | Admitting: Nurse Practitioner

## 2024-05-18 ENCOUNTER — Ambulatory Visit: Payer: Medicaid Other | Admitting: Nurse Practitioner

## 2024-05-18 VITALS — BP 102/76 | HR 73 | Ht 67.0 in | Wt 140.2 lb

## 2024-05-18 DIAGNOSIS — E039 Hypothyroidism, unspecified: Secondary | ICD-10-CM | POA: Diagnosis not present

## 2024-05-18 MED ORDER — LEVOTHYROXINE SODIUM 75 MCG PO TABS: 75.0000 ug | ORAL_TABLET | Freq: Every day | ORAL | 4 refills | Status: AC

## 2024-05-18 NOTE — Patient Instructions (Signed)

## 2024-05-18 NOTE — Progress Notes (Signed)
 Endocrinology Follow Up Note                                         05/18/2024, 9:59 AM    Subjective:   Subjective    Olivia Shepard is a 33 y.o.-year-old female patient being seen in follow up after being seen in consultation for hypothyroidism referred by Delores Suzann HERO, MD.   Past Medical History:  Diagnosis Date   Anemia    Anxiety    GERD (gastroesophageal reflux disease)    History of postpartum hemorrhage, currently pregnant    Hypothyroidism    Ruptured disk     Past Surgical History:  Procedure Laterality Date   ESOPHAGEAL DILATION      Social History   Socioeconomic History   Marital status: Married    Spouse name: Not on file   Number of children: Not on file   Years of education: Not on file   Highest education level: Not on file  Occupational History   Not on file  Tobacco Use   Smoking status: Never   Smokeless tobacco: Never  Vaping Use   Vaping status: Never Used  Substance and Sexual Activity   Alcohol use: No    Alcohol/week: 0.0 standard drinks of alcohol   Drug use: No   Sexual activity: Yes    Partners: Male    Birth control/protection: Condom  Other Topics Concern   Not on file  Social History Narrative   Works as Engineer, civil (consulting) on L and D at [employer]    Has 4 children, 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   Social Drivers of Health   Financial Resource Strain: Not on file  Food Insecurity: No Food Insecurity (10/11/2022)   Hunger Vital Sign    Worried About Running Out of Food in the Last Year: Never true    Ran Out of Food in the Last Year: Never true  Transportation Needs: No Transportation Needs (10/11/2022)   PRAPARE - Administrator, Civil Service (Medical): No    Lack of Transportation (Non-Medical): No  Physical Activity: Not on file  Stress: Not on file  Social Connections: Unknown (03/10/2022)   Received from [facility]   Social Network    Social Network:  Not on file    Family History  Problem Relation Age of Onset   Diabetes Mother    Varicose Veins Father    Prostate cancer Father    Atrial fibrillation Father    Varicose Veins Paternal Grandmother    Lung cancer Paternal Grandmother    Colon cancer Paternal Great-grandfather    Stomach cancer Paternal Great-grandfather    Colon cancer Paternal Great-grandmother    Esophageal cancer Neg Hx    Bladder Cancer Neg Hx    Uterine cancer Neg Hx    Ovarian cancer Neg Hx     Outpatient Encounter Medications as of 05/18/2024  Medication Sig   cyclobenzaprine  (FLEXERIL ) 5 MG tablet TAKE 1 TABLET BY MOUTH 3 TIMES  DAILY AS NEEDED FOR MUSCLE SPASMS.   meloxicam  (MOBIC ) 7.5 MG tablet Take 1 tablet (7.5 mg total) by mouth daily. WITH FOOD   sertraline  (ZOLOFT ) 100 MG tablet Take 1 tablet (100 mg total) by mouth daily.   tranexamic acid  (LYSTEDA ) 650 MG TABS tablet Take 2 tablets (1,300 mg total) by mouth 3 (three) times daily. Take during menses for a maximum of five days (Patient taking differently: Take 1,300 mg by mouth as needed. Take during menses for a maximum of five days)   [DISCONTINUED] levothyroxine  (SYNTHROID ) 75 MCG tablet TAKE 1 TABLET (75 MCG TOTAL) BY MOUTH DAILY BEFORE BREAKFAST.   levothyroxine  (SYNTHROID ) 75 MCG tablet Take 1 tablet (75 mcg total) by mouth daily before breakfast.   Prenatal Vit-Fe Fumarate-FA (PRENATAL MULTIVITAMIN) TABS tablet Take 1 tablet by mouth daily. (Patient not taking: Reported on 05/18/2024)   [DISCONTINUED] triamcinolone  ointment (KENALOG ) 0.1 % Apply 1 Application topically 2 (two) times daily. (Patient not taking: Reported on 05/18/2024)   No facility-administered encounter medications on file as of 05/18/2024.    ALLERGIES: No Known Allergies VACCINATION STATUS: Immunization History  Administered Date(s) Administered   DTaP 11/10/1989   Hepatitis B 08/06/2000, 01/28/2001, 10/27/2005   Hepatitis B, PED/ADOLESCENT 08/06/2000, 08/06/2000, 01/28/2001,  01/28/2001, 10/27/2005, 10/27/2005   Influenza Split 07/28/2012   Influenza, Seasonal, Injecte, Preservative Fre 07/28/2012, 08/03/2013, 08/11/2015, 08/18/2017, 08/27/2018, 08/05/2023   Influenza,inj,Quad PF,6+ Mos 08/27/2018, 06/27/2019, 08/02/2021   Influenza,inj,Quad PF,6-35 Mos 06/27/2019   Influenza,inj,quad, With Preservative 07/28/2012, 08/03/2013, 08/11/2015, 08/18/2017, 08/27/2018   Influenza-Unspecified 07/28/2012, 08/03/2013, 08/11/2015, 08/18/2017, 06/27/2019   MMR 02/17/1991, 02/09/1995, 09/18/2016, 10/13/2022   Meningococcal Conjugate 06/27/2008   Meningococcal polysaccharide vaccine (MPSV4) 06/27/2008   PFIZER Comirnaty(Gray Top)Covid-19 Tri-Sucrose Vaccine 12/25/2020   PFIZER(Purple Top)SARS-COV-2 Vaccination 06/20/2020, 07/11/2020   PPD Test 06/14/2008, 02/03/2011, 02/13/2012   Td 04/25/2005   Tdap 02/09/1995, 08/10/2014, 08/29/2016, 04/30/2020     HPI   Olivia Shepard  is a patient with the above medical history. she was diagnosed with hypothyroidism a few years ago as she and her husband were trying to conceive a child.  She was started on Levothyroxine  25 mcg po daily as a result, now up to 75 mcg po daily.  She has been tested for thyroid  antibodies several times, negative.    -She is currently on Levothyroxine  75 mcg po daily before breakfast.  I reviewed patient's thyroid  tests:  Lab Results  Component Value Date   TSH 2.380 05/16/2024   TSH 1.160 03/18/2024   TSH 2.090 11/17/2023   TSH 1.010 05/15/2023   TSH 1.300 01/19/2023   TSH 0.758 10/29/2022   TSH 1.700 09/04/2022   TSH 1.320 07/18/2022   TSH 2.380 05/27/2022   TSH 2.200 04/14/2022   FREET4 1.24 05/16/2024   FREET4 1.35 11/17/2023   FREET4 1.25 05/15/2023   FREET4 1.23 01/19/2023   FREET4 1.55 10/29/2022   FREET4 1.11 09/04/2022   FREET4 1.19 07/18/2022   FREET4 1.27 05/27/2022   FREET4 1.18 04/14/2022   FREET4 0.98 08/31/2015     Pt denies feeling nodules in neck, hoarseness,  dysphagia/odynophagia, SOB with lying down.  she does have family history of thyroid  disorders in her mom (recently diagnosed).  No family history of thyroid  cancer. No history of radiation therapy to head or neck.  No recent use of iodine supplements.  Denies use of Biotin containing supplements.  I reviewed her chart and she also has a history of GERD (had to have esophagus stretched), anemia, anxiety.  Review of systems  Constitutional: + stable body weight,  current Body mass index is 21.96 kg/m. , no fatigue, no subjective hyperthermia, + intermittent subjective hypothermia Eyes: no blurry vision, no xerophthalmia ENT: no sore throat, no nodules palpated in throat, no dysphagia/odynophagia, no hoarseness Cardiovascular: no chest pain, no shortness of breath, no palpitations, no leg swelling Respiratory: no cough, no shortness of breath Gastrointestinal: no nausea/vomiting/diarrhea Musculoskeletal: no muscle/joint aches Skin: no rashes, no hyperemia Neurological: no tremors, + numbness/ tingling/ electric pain down left leg (being referred to neurology soon by PCP), no dizziness Psychiatric: no depression, no anxiety   Objective:   Objective     BP 102/76 (BP Location: Right Arm, Patient Position: Sitting, Cuff Size: Large)   Pulse 73   Ht 5' 7 (1.702 m)   Wt 140 lb 3.2 oz (63.6 kg)   LMP 05/17/2024 (Exact Date)   BMI 21.96 kg/m  Wt Readings from Last 3 Encounters:  05/18/24 140 lb 3.2 oz (63.6 kg)  04/14/24 141 lb (64 kg)  03/18/24 146 lb (66.2 kg)    BP Readings from Last 3 Encounters:  05/18/24 102/76  04/14/24 103/71  03/18/24 117/79      Physical Exam- Limited  Constitutional:  Body mass index is 21.96 kg/m. , not in acute distress, normal state of mind Eyes:  EOMI, no exophthalmos Neck: Supple Thyroid : No gross goiter Cardiovascular: RRR, no murmurs, rubs, or gallops, no edema Musculoskeletal: no gross deformities, strength intact in all four  extremities, no gross restriction of joint movements Skin:  no rashes, no hyperemia Neurological: no tremor with outstretched hands   CMP ( most recent) CMP     Component Value Date/Time   NA 141 12/07/2023 1717   K 3.9 12/07/2023 1717   CL 102 12/07/2023 1717   CO2 23 12/07/2023 1717   GLUCOSE 77 12/07/2023 1717   GLUCOSE 85 11/20/2023 1047   BUN 16 12/07/2023 1717   CREATININE 0.63 12/07/2023 1717   CREATININE 0.55 12/27/2015 1501   CALCIUM 9.7 12/07/2023 1717   PROT 7.0 11/20/2023 1047   PROT 7.1 08/02/2021 0937   ALBUMIN 4.4 11/20/2023 1047   ALBUMIN 4.8 08/02/2021 0937   AST 17 11/20/2023 1047   ALT 16 11/20/2023 1047   ALKPHOS 58 11/20/2023 1047   BILITOT 0.5 11/20/2023 1047   BILITOT 0.3 08/02/2021 0937   GFRNONAA >60 11/20/2023 1047   GFRNONAA >89 12/27/2015 1501   GFRAA 125 08/27/2018 1021   GFRAA >89 12/27/2015 1501     Diabetic Labs (most recent): Lab Results  Component Value Date   HGBA1C 5.1 12/07/2023     Lipid Panel ( most recent) Lipid Panel     Component Value Date/Time   CHOL 196 12/07/2023 1717   TRIG 53 12/07/2023 1717   HDL 87 12/07/2023 1717   CHOLHDL 2.3 12/07/2023 1717   CHOLHDL 1.7 12/27/2015 1501   VLDL 6 12/27/2015 1501   LDLCALC 99 12/07/2023 1717   LABVLDL 10 12/07/2023 1717       Lab Results  Component Value Date   TSH 2.380 05/16/2024   TSH 1.160 03/18/2024   TSH 2.090 11/17/2023   TSH 1.010 05/15/2023   TSH 1.300 01/19/2023   TSH 0.758 10/29/2022   TSH 1.700 09/04/2022   TSH 1.320 07/18/2022   TSH 2.380 05/27/2022   TSH 2.200 04/14/2022   FREET4 1.24 05/16/2024   FREET4 1.35 11/17/2023   FREET4 1.25 05/15/2023   FREET4 1.23 01/19/2023   FREET4 1.55 10/29/2022  FREET4 1.11 09/04/2022   FREET4 1.19 07/18/2022   FREET4 1.27 05/27/2022   FREET4 1.18 04/14/2022   FREET4 0.98 08/31/2015     Latest Reference Range & Units 01/19/23 16:20 05/15/23 16:02 11/17/23 09:42 03/18/24 16:37 05/16/24 09:48  TSH 0.450 -  4.500 uIU/mL 1.300 1.010 2.090 1.160 2.380  T4,Free(Direct) 0.82 - 1.77 ng/dL 8.76 8.74 8.64  8.75    Assessment & Plan:   ASSESSMENT / PLAN:  1. Hypothyroidism-acquired  Patient with long-standing hypothyroidism, on levothyroxine  therapy. On physical exam, patient does not have gross goiter, thyroid  nodules, or neck compression symptoms.  Her antibody testing was negative, ruling out autoimmune thyroid  dysfunction.    Her previsit thyroid  function tests are consistent with appropriate hormone replacement.  She is advised to continue Levothyroxine  75 mcg po daily before breakfast.  She is advised to reach out if she starts to develop symptoms so we can check sooner if needed.  - We discussed about correct intake of levothyroxine , at fasting, with water, separated by at least 30 minutes from breakfast, and separated by more than 4 hours from calcium, iron, multivitamins, acid reflux medications (PPIs). -Patient is made aware of the fact that thyroid  hormone replacement is needed for life, dose to be adjusted by periodic monitoring of thyroid  function tests.   -Due to absence of clinical goiter, no need for thyroid  ultrasound at this time.    I spent  28  minutes in the care of the patient today including review of labs from Thyroid  Function, CMP, and other relevant labs ; imaging/biopsy records (current and previous including abstractions from other facilities); face-to-face time discussing  her lab results and symptoms, medications doses, her options of short and long term treatment based on the latest standards of care / guidelines;   and documenting the encounter.  Olivia Shepard  participated in the discussions, expressed understanding, and voiced agreement with the above plans.  All questions were answered to her satisfaction. she is encouraged to contact clinic should she have any questions or concerns prior to her return visit.   FOLLOW UP PLAN:  Return in about 1 year (around  05/18/2025) for Thyroid  follow up, Previsit labs.  Olivia Shepard, [facility] [address] [place], [place] [address] Phone: [phone] Fax: [phone]  05/18/2024, 9:59 AM

## 2024-06-30 ENCOUNTER — Encounter: Payer: Self-pay | Admitting: Nurse Practitioner

## 2024-07-09 ENCOUNTER — Ambulatory Visit
Admission: EM | Admit: 2024-07-09 | Discharge: 2024-07-09 | Disposition: A | Discharge status: Home / Self Care | Attending: Nurse Practitioner | Admitting: Nurse Practitioner

## 2024-07-09 DIAGNOSIS — J029 Acute pharyngitis, unspecified: Secondary | ICD-10-CM

## 2024-07-09 DIAGNOSIS — Z79890 Hormone replacement therapy (postmenopausal): Secondary | ICD-10-CM | POA: Insufficient documentation

## 2024-07-09 DIAGNOSIS — B9789 Other viral agents as the cause of diseases classified elsewhere: Secondary | ICD-10-CM | POA: Diagnosis not present

## 2024-07-09 DIAGNOSIS — Z791 Long term (current) use of non-steroidal anti-inflammatories (NSAID): Secondary | ICD-10-CM | POA: Insufficient documentation

## 2024-07-09 DIAGNOSIS — Z79899 Other long term (current) drug therapy: Secondary | ICD-10-CM | POA: Diagnosis not present

## 2024-07-09 DIAGNOSIS — J028 Acute pharyngitis due to other specified organisms: Secondary | ICD-10-CM | POA: Diagnosis not present

## 2024-07-09 LAB — POCT RAPID STREP A (OFFICE): Rapid Strep A Screen: NEGATIVE

## 2024-07-09 MED ORDER — LIDOCAINE VISCOUS HCL 2 % MT SOLN: OROMUCOSAL | 0 refills | Status: AC

## 2024-07-09 MED ORDER — IBUPROFEN 800 MG PO TABS: 800.0000 mg | ORAL_TABLET | Freq: Three times a day (TID) | ORAL | 0 refills | Status: DC | PRN

## 2024-07-09 MED ORDER — LIDOCAINE VISCOUS HCL 2 % MT SOLN
15.0000 mL | Freq: Once | OROMUCOSAL | Status: AC
  Administered 2024-07-09: 15 mL via OROMUCOSAL

## 2024-07-09 NOTE — Discharge Instructions (Addendum)
 Your symptoms are most likely due to viral pharyngitis, which means inflammation of the throat caused by a virus. This is a very common reason for a sore throat. While pharyngitis can sometimes be caused by bacteria such as strep, your test today was negative, which means your symptoms are not from strep throat. Because this is a viral infection, antibiotics are not needed, as they only work against bacterial infections.  Take any medications you were prescribed exactly as directed. To help with throat discomfort, you can sip warm liquids like broth, tea, or warm water. Cold or frozen drinks and snacks, like ice pops, can also soothe your throat. Gargling with warm salt water three to four times a day may provide relief, and sucking on hard candy or throat lozenges can help as well. Using a cool-mist humidifier at night can keep the air moist and make breathing easier. Tylenol or ibuprofen can be taken if you have any pain or fever.  Be sure to drink plenty of fluids to stay well hydrated. Once you have finished your medication and your symptoms have improved, remember to replace your toothbrush to help prevent re-infection. If your symptoms get worse or do not improve within a few days, follow up with your healthcare provider.

## 2024-07-09 NOTE — ED Provider Notes (Signed)
 [facility]    CSN: [id number] Arrival date & time: 07/09/24  0806      History   Chief Complaint Chief Complaint  Patient presents with   Sore Throat    HPI Olivia Shepard is a 33 y.o. female.   Discussed the use of AI scribe software for clinical note transcription with the patient, who gave verbal consent to proceed.   Patient presents with sore throat with onset yesterday. The patient reports associated symptoms of chills but denies fever, body aches, rhinorrhea, congestion, nausea, vomiting, or diarrhea.  The patient has not tried anything for her symptoms.  She denies any recent exposure to anyone who has been sick.  The following portions of the patient's history were reviewed and updated as appropriate: allergies, current medications, past family history, past medical history, past social history, past surgical history, and problem list.    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 Authorizing Provider  hydrocortisone (ANUSOL-HC) 25 MG suppository Place 25  mg rectally 2 (two) times daily as needed. 11/25/22  Yes [provider]  ibuprofen  (ADVIL ) 800 MG tablet Take 1 tablet (800 mg total) by mouth every 8 (eight) hours as needed (pain). Take with food to avoid stomach upset. Do not take any additional NSAIDs while on this. You may take tylenol  in addition to this if needed for extra pain relief. 07/09/24  Yes Iola Lukes, FNP  influenza vac split quadrivalent PF (FLUARIX) 0.5 ML injection Inject 0.5 mLs into the muscle once. 06/27/19  Yes [provider]  levothyroxine  (SYNTHROID ) 75 MCG tablet Take 1 tablet (75 mcg total) by mouth daily before breakfast. 05/18/24  Yes Reardon, Benton PARAS, NP  lidocaine  (XYLOCAINE ) 2 % solution Gargle with 15 mL as needed for sore throat. Do not rinse or swallow. Avoid eating or drinking for 15 minutes after use. 07/09/24  Yes Iola Lukes, FNP  nystatin -triamcinolone  (MYCOLOG II) cream Apply 1 Application topically 2 (two) times daily. 07/12/19  Yes [provider]  ondansetron  (ZOFRAN -ODT) 4 MG disintegrating tablet Take 4 mg by mouth every 8 (eight) hours as needed. 05/08/24  Yes [provider]  sertraline  (ZOLOFT ) 100 MG tablet Take 1 tablet (100 mg total) by mouth daily. 02/03/24  Yes Ajewole, Christana,  MD  cyclobenzaprine  (FLEXERIL ) 5 MG tablet TAKE 1 TABLET BY MOUTH 3 TIMES DAILY AS NEEDED FOR MUSCLE SPASMS. 03/17/24   Zuleta, Kaitlin G, NP  meloxicam  (MOBIC ) 7.5 MG tablet Take 1 tablet (7.5 mg total) by mouth daily. WITH FOOD 04/14/24   Ajewole, Christana, MD  Prenatal Vit-Fe Fumarate-FA (PRENATAL MULTIVITAMIN) TABS tablet Take 1 tablet by mouth daily. Patient not taking: No sig reported    [provider]  tranexamic acid  (LYSTEDA ) 650 MG TABS tablet Take 2 tablets (1,300 mg total) by mouth 3 (three) times daily. Take during menses for a maximum of five days Patient taking differently: Take 1,300 mg by mouth as needed. Take during menses for a maximum of five days 04/14/24    Jeralyn Crutch, MD    Family History Family History  Problem Relation Age of Onset   Diabetes Mother    Varicose Veins Father    Prostate cancer Father    Atrial fibrillation Father    Varicose Veins Paternal Grandmother    Lung cancer Paternal Grandmother    Colon cancer Paternal Great-grandfather    Stomach cancer Paternal Great-grandfather    Colon cancer Paternal Great-grandmother    Esophageal cancer Neg Hx    Bladder Cancer Neg Hx    Uterine cancer Neg Hx    Ovarian cancer Neg Hx     Social History Social History   Tobacco Use   Smoking status: Never   Smokeless tobacco: Never  Vaping Use   Vaping status: Never Used  Substance Use Topics   Alcohol use: No    Alcohol/week: 0.0 standard drinks of alcohol   Drug use: No     Allergies   Patient has no known allergies.   Review of Systems Review of Systems  Constitutional:  Positive for fatigue. Negative for fever.  HENT:  Positive for sore throat. Negative for congestion, postnasal drip and rhinorrhea.   Respiratory:  Negative for cough.   Gastrointestinal:  Negative for diarrhea, nausea and vomiting.  Neurological:  Negative for headaches.  All other systems reviewed and are negative.    Physical Exam Triage Vital Signs ED Triage Vitals  Encounter Vitals Group     BP 07/09/24 0827 93/67     Girls Systolic BP Percentile --      Girls Diastolic BP Percentile --      Boys Systolic BP Percentile --      Boys Diastolic BP Percentile --      Pulse Rate 07/09/24 0827 81     Resp 07/09/24 0827 18     Temp 07/09/24 0827 98.1 F (36.7 C)     Temp Source 07/09/24 0827 Oral     SpO2 07/09/24 0827 99 %     Weight 07/09/24 0823 140 lb (63.5 kg)     Height 07/09/24 0823 5' 7 (1.702 m)     Head Circumference --      Peak Flow --      Pain Score 07/09/24 0821 7     Pain Loc --      Pain Education --      Exclude from Growth Chart --    No data found.  Updated Vital Signs BP 93/67 (BP Location:  Right Arm)   Pulse 81   Temp 98.1 F (36.7 C) (Oral)   Resp 18   Ht 5' 7 (1.702 m)   Wt 140 lb (63.5 kg)   LMP 06/13/2024 (Approximate)   SpO2 99%   BMI 21.93 kg/m  Visual Acuity Right Eye Distance:   Left Eye Distance:   Bilateral Distance:    Right Eye Near:   Left Eye Near:    Bilateral Near:     Physical Exam Vitals reviewed.  Constitutional:      General: She is not in acute distress.    Appearance: Normal appearance. She is well-developed and normal weight. She is not ill-appearing, toxic-appearing or diaphoretic.  HENT:     Head: Normocephalic.     Nose: Nose normal.     Mouth/Throat:     Lips: Pink.     Mouth: Mucous membranes are moist.     Pharynx: Uvula midline. No pharyngeal swelling, oropharyngeal exudate, posterior oropharyngeal erythema or uvula swelling.     Tonsils: No tonsillar exudate or tonsillar abscesses.  Cardiovascular:     Rate and Rhythm: Normal rate.  Pulmonary:     Effort: Pulmonary effort is normal. No tachypnea.     Breath sounds: Normal breath sounds and air entry. No decreased air movement. No decreased breath sounds.  Musculoskeletal:        General: Normal range of motion.     Cervical back: Full passive range of motion without pain, normal range of motion and neck supple.  Lymphadenopathy:     Cervical: No cervical adenopathy (Tenderness).  Skin:    General: Skin is warm and dry.  Neurological:     General: No focal deficit present.     Mental Status: She is alert and oriented to person, place, and time.  Psychiatric:        Mood and Affect: Mood normal.        Behavior: Behavior normal. Behavior is cooperative.      UC Treatments / Results  Labs (all labs ordered are listed, but only abnormal results are displayed) Labs Reviewed  POCT RAPID STREP A (OFFICE) - Normal  CULTURE, GROUP A STREP [facility])    EKG   Radiology No results found.  Procedures Procedures (including critical care time)  Medications Ordered  in UC Medications  lidocaine  (XYLOCAINE ) 2 % viscous mouth solution 15 mL (has no administration in time range)    Initial Impression / Assessment and Plan / UC Course  I have reviewed the triage vital signs and the nursing notes.  Pertinent labs & imaging results that were available during my care of the patient were reviewed by me and considered in my medical decision making (see chart for details).     Patient presents with sore throat consistent with pharyngitis. Rapid strep test was negative, and throat culture has been sent for confirmation. Clinical presentation suggests viral etiology at this time. Patient was advised that they will only be contacted if the culture returns positive and antibiotics are needed. Otherwise, they may review results through MyChart or follow up with their primary care provider as needed. Supportive care was discussed, and patient was instructed to return if symptoms worsen or new concerns arise.  Today's evaluation has revealed no signs of a dangerous process. Discussed diagnosis with patient and/or guardian. Patient and/or guardian aware of their diagnosis, possible red flag symptoms to watch out for and need for close follow up. Patient and/or guardian understands verbal and written discharge instructions. Patient and/or guardian comfortable with plan and disposition.  Patient and/or guardian has a clear mental status at this time, good insight into illness (after discussion and teaching) and has clear judgment to make decisions regarding their care  Documentation was completed with the aid of voice  recognition software. Transcription may contain typographical errors. Final Clinical Impressions(s) / UC Diagnoses   Final diagnoses:  Acute sore throat  Acute viral pharyngitis     Discharge Instructions      Your symptoms are most likely due to viral pharyngitis, which means inflammation of the throat caused by a virus. This is a very common reason for a  sore throat. While pharyngitis can sometimes be caused by bacteria such as strep, your test today was negative, which means your symptoms are not from strep throat. Because this is a viral infection, antibiotics are not needed, as they only work against bacterial infections.  Take any medications you were prescribed exactly as directed. To help with throat discomfort, you can sip warm liquids like broth, tea, or warm water. Cold or frozen drinks and snacks, like ice pops, can also soothe your throat. Gargling with warm salt water three to four times a day may provide relief, and sucking on hard candy or throat lozenges can help as well. Using a cool-mist humidifier at night can keep the air moist and make breathing easier. Tylenol  or ibuprofen  can be taken if you have any pain or fever.  Be sure to drink plenty of fluids to stay well hydrated. Once you have finished your medication and your symptoms have improved, remember to replace your toothbrush to help prevent re-infection. If your symptoms get worse or do not improve within a few days, follow up with your healthcare provider.       ED Prescriptions     Medication Sig Dispense Auth. Provider   lidocaine  (XYLOCAINE ) 2 % solution Gargle with 15 mL as needed for sore throat. Do not rinse or swallow. Avoid eating or drinking for 15 minutes after use. 100 mL Iola Lukes, FNP   ibuprofen  (ADVIL ) 800 MG tablet Take 1 tablet (800 mg total) by mouth every 8 (eight) hours as needed (pain). Take with food to avoid stomach upset. Do not take any additional NSAIDs while on this. You may take tylenol  in addition to this if needed for extra pain relief. 21 tablet Iola Lukes, FNP      PDMP not reviewed this encounter.   Iola Lukes, [place] 07/09/24 1056

## 2024-07-09 NOTE — ED Triage Notes (Signed)
 Yesterday morning I woke up with a sore throat, I see some areas/spots in my throat but last night I noticed white spots and hard to swallow. No fever. No new/unexplained rash.

## 2024-07-12 ENCOUNTER — Ambulatory Visit (HOSPITAL_COMMUNITY): Payer: Self-pay

## 2024-07-12 LAB — CULTURE, GROUP A STREP (THRC)

## 2024-07-15 DIAGNOSIS — H5203 Hypermetropia, bilateral: Secondary | ICD-10-CM | POA: Diagnosis not present

## 2024-09-06 ENCOUNTER — Other Ambulatory Visit: Payer: Self-pay | Admitting: Medical Genetics

## 2024-09-06 DIAGNOSIS — Z006 Encounter for examination for normal comparison and control in clinical research program: Secondary | ICD-10-CM

## 2024-09-12 ENCOUNTER — Encounter: Payer: Self-pay | Admitting: Radiology

## 2024-10-01 ENCOUNTER — Encounter: Payer: Self-pay | Admitting: Nurse Practitioner

## 2024-10-03 ENCOUNTER — Other Ambulatory Visit: Payer: Self-pay | Admitting: Nurse Practitioner

## 2024-10-03 DIAGNOSIS — E039 Hypothyroidism, unspecified: Secondary | ICD-10-CM

## 2024-10-04 DIAGNOSIS — E039 Hypothyroidism, unspecified: Secondary | ICD-10-CM | POA: Diagnosis not present

## 2024-10-05 ENCOUNTER — Ambulatory Visit: Payer: Self-pay | Admitting: Nurse Practitioner

## 2024-10-05 DIAGNOSIS — E039 Hypothyroidism, unspecified: Secondary | ICD-10-CM

## 2024-10-05 LAB — TSH: TSH: 1.91 u[IU]/mL (ref 0.450–4.500)

## 2024-10-05 LAB — T4, FREE: Free T4: 1.39 ng/dL (ref 0.82–1.77)

## 2024-10-05 NOTE — Progress Notes (Signed)
 FYI I Sent mychart message going over thyroid labs.

## 2024-10-05 NOTE — Progress Notes (Signed)
 Noted patient has been sent a message reviewing results.

## 2024-10-07 LAB — GENECONNECT MOLECULAR SCREEN: Genetic Analysis Overall Interpretation: NEGATIVE

## 2024-11-11 ENCOUNTER — Encounter: Payer: Self-pay | Admitting: Emergency Medicine

## 2024-11-11 ENCOUNTER — Ambulatory Visit
Admission: EM | Admit: 2024-11-11 | Discharge: 2024-11-11 | Disposition: A | Discharge status: Home / Self Care | Attending: Family Medicine | Admitting: Family Medicine

## 2024-11-11 DIAGNOSIS — H1032 Unspecified acute conjunctivitis, left eye: Secondary | ICD-10-CM | POA: Diagnosis not present

## 2024-11-11 MED ORDER — ERYTHROMYCIN 5 MG/GM OP OINT: TOPICAL_OINTMENT | Freq: Four times a day (QID) | OPHTHALMIC | 0 refills | Status: AC

## 2024-11-11 NOTE — ED Triage Notes (Signed)
 Pt presents c/o eye problem x 3 days. Pt states,  I've had this sharp pain on the side of my eye since the 30 th of Dec. Every time I blink it just felt like something sharp in there. Yesterday I noticed more and more swollen and the tear ducts seems blocked. When I press on them tears flow out freely. I don't have mucus or anything but the eye sockets is swollen and constantly tearing up. I used some left over Erythromycin last night just incase it was infected.

## 2024-11-11 NOTE — Discharge Instructions (Addendum)
 The fluorescein staining did not show any uptake, so there does not seem to be an abrasion or a serious viral infection in your eye.  Erythromycin ointment--Place 1/2 inch ribbon in your left eye 4 times daily for 5 days.  Please call the ophthalmology office listed in this paperwork for follow-up.

## 2024-11-11 NOTE — ED Provider Notes (Signed)
 " [facility]    CSN: [id number] Arrival date & time: 11/11/24  0946      History   Chief Complaint Chief Complaint  Patient presents with   Eye Problem    HPI Olivia Shepard is a 34 y.o. female.    Eye Problem  Here for irritation and erythema of her left eye.  This began about 2 days ago, and then she feels it is worse this morning with a spot that is very irritated and redder on the inner part of her eye.  It is tearing and had some discharge.  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 Authorizing Provider  erythromycin ophthalmic ointment Place into the left eye 4 (four) times daily. 11/11/24  Yes Vonna Sharlet POUR, MD  levothyroxine  (SYNTHROID ) 75 MCG tablet Take 1 tablet (75 mcg total) by mouth daily before breakfast. 05/18/24  Yes Therisa Benton PARAS, NP  sertraline  (ZOLOFT ) 100 MG tablet Take 1 tablet (100 mg total) by mouth daily. 02/03/24  Yes Ajewole,  Christana, MD  cyclobenzaprine  (FLEXERIL ) 5 MG tablet TAKE 1 TABLET BY MOUTH 3 TIMES DAILY AS NEEDED FOR MUSCLE SPASMS. 03/17/24   Zuleta, Kaitlin G, NP  hydrocortisone (ANUSOL-HC) 25 MG suppository Place 25 mg rectally 2 (two) times daily as needed. 11/25/22   [provider]  influenza vac split quadrivalent PF (FLUARIX) 0.5 ML injection Inject 0.5 mLs into the muscle once. 06/27/19   [provider]  lidocaine  (XYLOCAINE ) 2 % solution Gargle with 15 mL as needed for sore throat. Do not rinse or swallow. Avoid eating or drinking for 15 minutes after use. 07/09/24   Murrill, Samantha, FNP  nystatin -triamcinolone  (MYCOLOG II) cream Apply 1 Application topically 2 (two) times daily. 07/12/19   [provider]  ondansetron  (ZOFRAN -ODT) 4 MG disintegrating tablet Take 4 mg by mouth every 8 (eight) hours as needed. 05/08/24   [provider]  pantoprazole  (PROTONIX ) 40 MG tablet Take 1 tablet by mouth daily.    [provider]  Prenatal Vit-Fe Fumarate-FA (PRENATAL MULTIVITAMIN) TABS tablet Take 1 tablet by mouth daily. Patient not taking: No sig reported  [provider]    Family History Family History  Problem Relation Age of Onset   Diabetes Mother    Varicose Veins Father    Prostate cancer Father    Atrial fibrillation Father    Varicose Veins Paternal Grandmother    Lung cancer Paternal Grandmother    Colon cancer Paternal Great-grandfather    Stomach cancer Paternal Great-grandfather    Colon cancer Paternal Great-grandmother    Esophageal cancer Neg Hx    Bladder Cancer Neg Hx    Uterine cancer Neg Hx    Ovarian cancer Neg Hx     Social History Social History[1]   Allergies   Patient has no known allergies.   Review of Systems Review of Systems   Physical Exam Triage Vital Signs ED Triage Vitals  Encounter Vitals Group     BP 11/11/24 1049 106/72     Girls Systolic BP Percentile --      Girls Diastolic BP Percentile  --      Boys Systolic BP Percentile --      Boys Diastolic BP Percentile --      Pulse Rate 11/11/24 1049 88     Resp 11/11/24 1049 16     Temp 11/11/24 1049 98.2 F (36.8 C)     Temp Source 11/11/24 1049 Oral     SpO2 11/11/24 1049 99 %     Weight 11/11/24 1048 139 lb 15.9 oz (63.5 kg)     Height --      Head Circumference --      Peak Flow --      Pain Score 11/11/24 1046 4     Pain Loc --      Pain Education --      Exclude from Growth Chart --    No data found.  Updated Vital Signs BP 106/72 (BP Location: Left Arm)   Pulse 88   Temp 98.2 F (36.8 C) (Oral)   Resp 16   Wt 63.5 kg   LMP 08/24/2024 (Approximate)   SpO2 99%   Breastfeeding No   BMI 21.93 kg/m   Visual Acuity Right Eye Distance:   Left Eye Distance:   Bilateral Distance:    Right Eye Near:   Left Eye Near:    Bilateral Near:     Physical Exam Vitals reviewed.  Constitutional:      General: She is not in acute distress.    Appearance: She is not ill-appearing, toxic-appearing or diaphoretic.  HENT:     Mouth/Throat:     Mouth: Mucous membranes are moist.  Eyes:     Extraocular Movements: Extraocular movements intact.     Pupils: Pupils are equal, round, and reactive to light.     Comments: Right conjunctiva is normal.  Her left conjunctiva is injected over the entire surface, but there is more intense redness that is splotchy on her medial conjunctiva.  No swelling of the eyelids.  There is a little dried discharge at the inner canthus.  Skin:    Coloration: Skin is not jaundiced or pale.  Neurological:     General: No focal deficit present.     Mental Status: She is alert.  Psychiatric:        Behavior: Behavior normal.      UC Treatments / Results  Labs (all labs ordered are listed, but only abnormal results are displayed) Labs Reviewed - No data to display  EKG   Radiology No results found.  Procedures Procedures (including  critical care time)  Medications Ordered in  UC Medications - No data to display  Initial Impression / Assessment and Plan / UC Course  I have reviewed the triage vital signs and the nursing notes.  Pertinent labs & imaging results that were available during my care of the patient were reviewed by me and considered in my medical decision making (see chart for details).      Verbal consent is given after risks benefits of fluorescein staining are discussed.  There is no uptake at all with the fluorescein stain.  I have asked her to follow-up with ophthalmology who is on-call for us .   Final Clinical Impressions(s) / UC Diagnoses   Final diagnoses:  Acute conjunctivitis of left eye, unspecified acute conjunctivitis type     Discharge Instructions      The fluorescein staining did not show any uptake, so there does not seem to be an abrasion or a serious viral infection in your eye.  Erythromycin ointment--Place 1/2 inch ribbon in your left eye 4 times daily for 5 days.  Please call the ophthalmology office listed in this paperwork for follow-up.         ED Prescriptions     Medication Sig Dispense Auth. Provider   erythromycin ophthalmic ointment Place into the left eye 4 (four) times daily. 3.5 g Vonna Sharlet POUR, MD      PDMP not reviewed this encounter.    [1]  Social History Tobacco Use   Smoking status: Never    Passive exposure: Never   Smokeless tobacco: Never  Vaping Use   Vaping status: Never Used  Substance Use Topics   Alcohol use: No    Alcohol/week: 0.0 standard drinks of alcohol   Drug use: No     Vonna Sharlet POUR, MD 11/11/24 1211  "

## 2024-11-21 ENCOUNTER — Encounter: Payer: Self-pay | Admitting: *Deleted

## 2024-11-24 ENCOUNTER — Ambulatory Visit

## 2024-11-24 VITALS — BP 110/80 | HR 88 | Ht 67.0 in | Wt 148.4 lb

## 2024-11-24 DIAGNOSIS — L989 Disorder of the skin and subcutaneous tissue, unspecified: Secondary | ICD-10-CM

## 2024-11-24 DIAGNOSIS — B353 Tinea pedis: Secondary | ICD-10-CM

## 2024-11-24 LAB — POCT SKIN KOH: Skin KOH, POC: POSITIVE — AB

## 2024-11-24 MED ORDER — TERBINAFINE HCL 1 % EX CREA: 1.0000 | TOPICAL_CREAM | Freq: Two times a day (BID) | CUTANEOUS | 0 refills | Status: AC

## 2024-11-24 NOTE — Patient Instructions (Signed)
 Thank you for coming in today! Here is a summary of what we discussed:  -Based on the skin shaving, we believe your rash is fungal. Please use the terbinafine  cream for 2 weeks. If your symptoms are not improving, please schedule a follow up appointment and we can try a different treatment.  Please call the clinic at [phone] if your symptoms worsen or you have any concerns.  Best, Dr Adele

## 2024-11-24 NOTE — Addendum Note (Signed)
 Addended by: ANDERS CUMINS T on: 11/24/2024 02:57 PM   Modules accepted: Orders

## 2024-11-24 NOTE — Progress Notes (Signed)
" ° ° °  SUBJECTIVE:   CHIEF COMPLAINT / HPI:   L foot rash --has been occurring for about 6 years --rash comes and goes, lasts for a few weeks at a time --red, itchy, cracks open and bleeds (pain when this occurs) -- Has tried topical and oral antifungal, last taken about 9 months.  Also think she has tried Kenalog  cream in the past --never had biopsy --taking valacyclovir medication for eye HSV since 1/3, noticed foot rash returned around that time --Denies other medication changes -- Improved in the last few days, patient has not been doing anything differently and has not tried any treatments -- Photo below of rash a few days ago when it was worse  PERTINENT  PMH / PSH: Reviewed  OBJECTIVE:   BP 110/80   Pulse 88   Ht 5' 7 (1.702 m)   Wt 148 lb 6.4 oz (67.3 kg)   LMP 08/24/2024 (Approximate)   SpO2 99%   BMI 23.24 kg/m   General: Awake and conversant, no acute distress Pulm: normal work of breathing on room air Skin: Mild interdigital peeling skin on left foot, no peeling skin seen on plantar surface of left foot Neuro: No focal deficits Psych: Appropriate mood and affect    ASSESSMENT/PLAN:   Assessment & Plan Tinea pedis, unspecified laterality KOH scraping reveals hyphae.  Though improvement of symptoms less likely and true fungal infection, will treat empirically for tinea pedis with topical terbinafine  x 2 weeks. Advised patient to schedule follow-up with PCP if symptoms persist or worsen. Can consider treating for keratolysis exfoliativa with U-lactin cream at that time.  Can also investigate whether symptoms are due to medication ADE given timing of rash onset with initiation of antiviral medication.     Rea Raring, MD [facility] "

## 2024-12-10 LAB — TSH: TSH: 1.44 u[IU]/mL (ref 0.450–4.500)

## 2024-12-10 LAB — T4, FREE: Free T4: 1.31 ng/dL (ref 0.82–1.77)

## 2025-05-18 ENCOUNTER — Ambulatory Visit: Admitting: Nurse Practitioner
